# Patient Record
Sex: Female | Born: 1970 | Race: White | Hispanic: No | Marital: Married | Smoking: Never smoker
Health system: Southern US, Community
[De-identification: ages and names within clinical notes are randomized; demographics above are authoritative.]

## PROBLEM LIST (undated history)

## (undated) ENCOUNTER — Emergency Department (HOSPITAL_COMMUNITY): Discharge status: Against Medical Advice | Payer: Self-pay

## (undated) DIAGNOSIS — Z9221 Personal history of antineoplastic chemotherapy: Secondary | ICD-10-CM

## (undated) DIAGNOSIS — Z923 Personal history of irradiation: Secondary | ICD-10-CM

## (undated) DIAGNOSIS — C801 Malignant (primary) neoplasm, unspecified: Secondary | ICD-10-CM

## (undated) DIAGNOSIS — R519 Headache, unspecified: Secondary | ICD-10-CM

---

## 2003-02-16 ENCOUNTER — Other Ambulatory Visit: Admission: RE | Admit: 2003-02-16 | Discharge: 2003-02-16 | Payer: Self-pay | Admitting: Obstetrics and Gynecology

## 2003-05-27 ENCOUNTER — Other Ambulatory Visit: Admission: RE | Admit: 2003-05-27 | Discharge: 2003-05-27 | Payer: Self-pay | Admitting: Obstetrics and Gynecology

## 2003-11-02 ENCOUNTER — Other Ambulatory Visit: Admission: RE | Admit: 2003-11-02 | Discharge: 2003-11-02 | Payer: Self-pay | Admitting: Obstetrics and Gynecology

## 2004-05-20 ENCOUNTER — Inpatient Hospital Stay (HOSPITAL_COMMUNITY): Admission: AD | Admit: 2004-05-20 | Discharge: 2004-05-22 | Payer: Self-pay | Admitting: Obstetrics and Gynecology

## 2004-07-11 ENCOUNTER — Other Ambulatory Visit: Admission: RE | Admit: 2004-07-11 | Discharge: 2004-07-11 | Payer: Self-pay | Admitting: Obstetrics and Gynecology

## 2016-02-02 ENCOUNTER — Other Ambulatory Visit: Payer: Self-pay | Admitting: Obstetrics and Gynecology

## 2016-02-02 DIAGNOSIS — R928 Other abnormal and inconclusive findings on diagnostic imaging of breast: Secondary | ICD-10-CM

## 2016-02-10 ENCOUNTER — Ambulatory Visit
Admission: RE | Admit: 2016-02-10 | Discharge: 2016-02-10 | Disposition: A | Discharge status: Home / Self Care | Payer: Managed Care, Other (non HMO) | Source: Ambulatory Visit | Attending: Obstetrics and Gynecology | Admitting: Obstetrics and Gynecology

## 2016-02-10 DIAGNOSIS — R928 Other abnormal and inconclusive findings on diagnostic imaging of breast: Secondary | ICD-10-CM

## 2021-04-06 ENCOUNTER — Other Ambulatory Visit: Payer: Self-pay | Admitting: Obstetrics and Gynecology

## 2021-04-06 DIAGNOSIS — R928 Other abnormal and inconclusive findings on diagnostic imaging of breast: Secondary | ICD-10-CM

## 2021-04-20 ENCOUNTER — Ambulatory Visit
Admission: RE | Admit: 2021-04-20 | Discharge: 2021-04-20 | Disposition: A | Discharge status: Home / Self Care | Payer: BC Managed Care – PPO | Source: Ambulatory Visit | Attending: Obstetrics and Gynecology | Admitting: Obstetrics and Gynecology

## 2021-04-20 ENCOUNTER — Other Ambulatory Visit: Payer: Self-pay | Admitting: Obstetrics and Gynecology

## 2021-04-20 ENCOUNTER — Ambulatory Visit
Admission: RE | Admit: 2021-04-20 | Discharge: 2021-04-20 | Disposition: A | Discharge status: Home / Self Care | Payer: Managed Care, Other (non HMO) | Source: Ambulatory Visit | Attending: Obstetrics and Gynecology | Admitting: Obstetrics and Gynecology

## 2021-04-20 ENCOUNTER — Other Ambulatory Visit: Payer: Self-pay

## 2021-04-20 DIAGNOSIS — R928 Other abnormal and inconclusive findings on diagnostic imaging of breast: Secondary | ICD-10-CM

## 2021-04-20 DIAGNOSIS — N6310 Unspecified lump in the right breast, unspecified quadrant: Secondary | ICD-10-CM

## 2021-04-21 ENCOUNTER — Ambulatory Visit
Admission: RE | Admit: 2021-04-21 | Discharge: 2021-04-21 | Disposition: A | Discharge status: Home / Self Care | Payer: BC Managed Care – PPO | Source: Ambulatory Visit | Attending: Obstetrics and Gynecology | Admitting: Obstetrics and Gynecology

## 2021-04-21 DIAGNOSIS — C801 Malignant (primary) neoplasm, unspecified: Secondary | ICD-10-CM

## 2021-04-21 DIAGNOSIS — N6310 Unspecified lump in the right breast, unspecified quadrant: Secondary | ICD-10-CM

## 2021-04-21 HISTORY — DX: Malignant (primary) neoplasm, unspecified: C80.1

## 2021-04-26 ENCOUNTER — Other Ambulatory Visit (HOSPITAL_COMMUNITY): Payer: BC Managed Care – PPO

## 2021-04-28 ENCOUNTER — Encounter: Payer: Self-pay | Admitting: *Deleted

## 2021-04-28 ENCOUNTER — Telehealth: Payer: Self-pay | Admitting: *Deleted

## 2021-04-28 ENCOUNTER — Inpatient Hospital Stay: Payer: BC Managed Care – PPO | Attending: Hematology and Oncology | Admitting: Hematology and Oncology

## 2021-04-28 ENCOUNTER — Other Ambulatory Visit: Payer: Self-pay

## 2021-04-28 ENCOUNTER — Telehealth: Payer: Self-pay | Admitting: Hematology and Oncology

## 2021-04-28 ENCOUNTER — Other Ambulatory Visit: Payer: Self-pay | Admitting: *Deleted

## 2021-04-28 ENCOUNTER — Encounter: Payer: BC Managed Care – PPO | Admitting: Genetic Counselor

## 2021-04-28 DIAGNOSIS — C50411 Malignant neoplasm of upper-outer quadrant of right female breast: Secondary | ICD-10-CM | POA: Insufficient documentation

## 2021-04-28 DIAGNOSIS — Z803 Family history of malignant neoplasm of breast: Secondary | ICD-10-CM | POA: Diagnosis not present

## 2021-04-28 DIAGNOSIS — Z171 Estrogen receptor negative status [ER-]: Secondary | ICD-10-CM | POA: Insufficient documentation

## 2021-04-28 NOTE — Progress Notes (Deleted)
Breast - No Medical Intervention - Off Treatment. ? ?Patient Characteristics: ?Preoperative or Nonsurgical Candidate (Clinical Staging), Surgery followed by Adjuvant Therapy ?Therapeutic Status: Preoperative or Nonsurgical Candidate (Clinical Staging) ?AJCC M Category: cM0 ?AJCC Grade: G3 ?Breast Surgical Plan: Surgery followed by Adjuvant Therapy (if necessary) ?ER Status: Negative (-) ?AJCC 8 Stage Grouping: IB ?HER2 Status: Negative (-) ?AJCC T Category: cT1a ?AJCC N Category: cN0 ?PR Status: Negative (-) ? ?

## 2021-04-28 NOTE — Progress Notes (Signed)
[place] ?CONSULT NOTE ? ?Patient Care Team: ?Louretta Shorten, MD as PCP - General (Obstetrics and Gynecology) ? ?CHIEF COMPLAINTS/PURPOSE OF CONSULTATION:  ?Newly diagnosed breast cancer ? ?HISTORY OF PRESENTING ILLNESS:  ?Natasha Ortega 51 y.o. female is here because of recent diagnosis of right-sided breast cancer.  She had a routine mammogram that detected an abnormality in the right breast for which she underwent additional mammograms and ultrasound.  By ultrasound it measured 6 mm at 10:30 position 9 cm from the nipple.  There were no abnormal lymph nodes.  She underwent ultrasound-guided biopsy which revealed grade 2 IDC that was ER/PR negative and HER2 2+. ?I reviewed her records extensively and collaborated the history with the patient. ? ?SUMMARY OF ONCOLOGIC HISTORY: ?Oncology History  ?Malignant neoplasm of upper-outer quadrant of right breast in female, estrogen receptor negative ([place])  ?04/28/2021 Initial Diagnosis  ? Screening mammogram detected right breast mass 6 mm by ultrasound, axilla negative, biopsy: Grade 2 IDC ER 0%, PR 0%, HER2 2+ by IHC ?  ? ? ? ?MEDICAL HISTORY: No other past medical problems ?SURGICAL HISTORY: No prior surgeries ?SOCIAL HISTORY: Denies any tobacco or alcohol or recreational drug use ?FAMILY HISTORY: ?Family History  ?Problem Relation Age of Onset  ? Breast cancer Maternal Grandmother 34  ? ? ?ALLERGIES:  has no allergies on file. ? ?MEDICATIONS:  ?No current outpatient medications on file.  ? ?No current facility-administered medications for this visit.  ? ? ?REVIEW OF SYSTEMS:   ?Constitutional: Denies fevers, chills or abnormal night sweats ?All other systems were reviewed with the patient and are negative. ? ?PHYSICAL EXAMINATION: ?ECOG PERFORMANCE STATUS: 0 - Asymptomatic ? ?Vitals:  ? 04/28/21 1549  ?BP: (!) 148/86  ?Pulse: (!) 105  ?Resp: 18  ?Temp: 97.7 ?F (36.5 ?C)  ?SpO2: 99%  ? ?Filed Weights  ? 04/28/21 1549  ?Weight: 194 lb 9.6 oz (88.3 kg)   ? ?RADIOGRAPHIC STUDIES: ?I have personally reviewed the radiological reports and agreed with the findings in the report. ? ?ASSESSMENT AND PLAN:  ?Malignant neoplasm of upper-outer quadrant of right breast in female, estrogen receptor negative ([place]) ?04/21/2021:Screening mammogram detected right breast mass 6 mm by ultrasound, axilla negative, biopsy: Grade 2 IDC ER 0%, PR 0%, HER2 2+ by IHC ? ?Pathology and radiology counseling: Discussed with the patient, the details of pathology including the type of breast cancer,the clinical staging, the significance of ER, PR and HER-2/neu receptors and the implications for treatment. After reviewing the pathology in detail, we proceeded to discuss the different treatment options between surgery, radiation, chemotherapy  ? ?Treatment plan: ?1.  FISH for HER2 needs to be done ?2. breast conserving surgery with sentinel lymph node biopsy ?3.  Adjuvant chemotherapy based on final tumor size.  I discussed with the family that if the final tumor size is 6 mm or greater then she will require systemic chemotherapy.  The biopsy size was 5 mm.  Therefore we will hold off on placing a port until we see the final path. ?4.  Adjuvant radiation ? ?Genetic testing ? ?Return to clinic after surgery to discuss final pathology results and to determine if chemotherapy is required. ? ? ?All questions were answered. The patient knows to call the clinic with any problems, questions or concerns. ?  ? Harriette Ohara, MD ?04/28/21 ? ? ?

## 2021-04-28 NOTE — Telephone Encounter (Signed)
Called pt, provided navigation resources and contact information. Discussed new pt appt with Dr. Lindi Adie today (3/9) at 4pm ?Denies further questions at this time. Encourage pt to call with questions or needs. Received verbal understanding. ?

## 2021-04-28 NOTE — Progress Notes (Deleted)
DISCONTINUE ON PATHWAY REGIMEN - Breast ? ?No Medical Intervention - Off Treatment. ? ?PRIOR TREATMENT: [id number]: Referral to Surgery ? ?Breast - No Medical Intervention - Off Treatment. ? ?Patient Characteristics: ?Preoperative or Nonsurgical Candidate (Clinical Staging), Surgery followed by Adjuvant Therapy ?Therapeutic Status: Preoperative or Nonsurgical Candidate (Clinical Staging) ?AJCC M Category: cM0 ?AJCC Grade: G3 ?Breast Surgical Plan: Surgery followed by Adjuvant Therapy (if necessary) ?ER Status: Negative (-) ?AJCC 8 Stage Grouping: IB ?HER2 Status: Negative (-) ?AJCC T Category: cT1a ?AJCC N Category: cN0 ?PR Status: Negative (-) ? ?

## 2021-04-28 NOTE — Telephone Encounter (Signed)
Scheduled appt per 3/9 secure chat with RN Dawn. Pt is aware of appt date and time. Pt is aware to arrive 15 mins prior to appt time and to bring and updated insurance card. Pt is aware of appt location.   ?

## 2021-04-28 NOTE — Assessment & Plan Note (Signed)
04/21/2021:Screening mammogram detected right breast mass 6 mm by ultrasound, axilla negative, biopsy: Grade 2 IDC ER 0%, PR 0%, HER2 2+ by IHC ? ?Pathology and radiology counseling: Discussed with the patient, the details of pathology including the type of breast cancer,the clinical staging, the significance of ER, PR and HER-2/neu receptors and the implications for treatment. After reviewing the pathology in detail, we proceeded to discuss the different treatment options between surgery, radiation, chemotherapy  ? ?Treatment plan: ?1.  FISH for HER2 needs to be done ?2. breast conserving surgery with sentinel lymph node biopsy ?3.  Adjuvant chemotherapy based on final tumor size.  I discussed with the family that if the final tumor size is 6 mm or greater then she will require systemic chemotherapy.  The biopsy size was 5 mm.  Therefore we will hold off on placing a port until we see the final path. ?4.  Adjuvant radiation ? ?Genetic testing ? ?Return to clinic after surgery to discuss final pathology results and to determine if chemotherapy is required. ?

## 2021-04-29 NOTE — Progress Notes (Unsigned)
Request for [facility] faxed to [facility] [phone] ?

## 2021-05-02 ENCOUNTER — Encounter: Payer: Self-pay | Admitting: *Deleted

## 2021-05-02 ENCOUNTER — Telehealth: Payer: Self-pay | Admitting: Hematology and Oncology

## 2021-05-02 ENCOUNTER — Other Ambulatory Visit: Payer: BC Managed Care – PPO

## 2021-05-02 NOTE — Progress Notes (Incomplete)
Radiation Oncology         [phone] ________________________________  Name: Natasha Ortega        MRN: [id number]  Date of Service: 05/03/2021 DOB: 01/09/1971  RC:VELF, Shanon Brow, MD  Nicholas Lose, MD     REFERRING PHYSICIAN: Nicholas Lose, MD   DIAGNOSIS: The encounter diagnosis was Malignant neoplasm of upper-outer quadrant of right breast in female, estrogen receptor negative ([place]).   HISTORY OF PRESENT ILLNESS: Natasha Ortega is a 51 y.o. female seen at the request of Dr. Lindi Adie for new diagnosis of right breast cancer.  The patient was found on screening mammogram to have an asymmetry in the right breast confirmed in the upper outer quadrant by diagnostic mammogram.  By ultrasound this was found in the 10 o'clock position measuring 6 mm in her right axilla was negative for adenopathy.  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  She is seen today to discuss treatment recommendations of her cancer.  Dr. Lindi Adie has recommended proceeding with breast conserving surgery which she is in the process of trying to coordinate, he also discussed the possibility of adjuvant chemotherapy depending on HER2 results and/or Oncotype scoring.     PREVIOUS RADIATION THERAPY: {EXAM; YES/NO:19492::"No"}   PAST MEDICAL HISTORY: No past medical history on file.     PAST SURGICAL HISTORY:*** The histories are not reviewed yet. Please review them in the "History" navigator section and refresh this [place].   FAMILY HISTORY:  Family History  Problem Relation Age of Onset   Breast cancer Maternal Grandmother 20     SOCIAL HISTORY: She is married and lives in [place].***   ALLERGIES: Patient has no allergy information on record.   MEDICATIONS:  No current outpatient medications on file.   No current facility-administered medications for this visit.     REVIEW OF SYSTEMS: On review of  systems, the patient reports that she is doing ***     PHYSICAL EXAM:  Wt Readings from Last 3 Encounters:  04/28/21 194 lb 9.6 oz (88.3 kg)   Temp Readings from Last 3 Encounters:  04/28/21 97.7 F (36.5 C) (Temporal)   BP Readings from Last 3 Encounters:  04/28/21 (!) 148/86   Pulse Readings from Last 3 Encounters:  04/28/21 (!) 105    In general this is a well appearing *** female in no acute distress. She's alert and oriented x4 and appropriate throughout the examination. Cardiopulmonary assessment is negative for acute distress and she exhibits normal effort. Bilateral breast exam is deferred.    ECOG = ***  0 - Asymptomatic (Fully active, able to carry on all predisease activities without restriction)  1 - Symptomatic but completely ambulatory (Restricted in physically strenuous activity but ambulatory and able to carry out work of a light or sedentary nature. For example, light housework, office work)  2 - Symptomatic, <50% in bed during the day (Ambulatory and capable of all self care but unable to carry out any work activities. Up and about more than 50% of waking hours)  3 - Symptomatic, >50% in bed, but not bedbound (Capable of only limited self-care, confined to bed or chair 50% or more of waking hours)  4 - Bedbound (Completely disabled. Cannot carry on any self-care. Totally confined to bed or chair)  5 - Death   Eustace Pen MM, Creech RH, Tormey DC, et al. [phone]). "Toxicity and response criteria of  the [facility]". [place] Oncol. 5 (6): 649-55    LABORATORY DATA:  No results found for: WBC, HGB, HCT, MCV, PLT No results found for: NA, K, CL, CO2 No results found for: ALT, AST, GGT, ALKPHOS, BILITOT    RADIOGRAPHY: US BREAST LTD UNI RIGHT INC AXILLA  Result Date: 04/20/2021 CLINICAL DATA:  Patient returns today to evaluate a possible RIGHT breast mass identified on recent screening mammogram. EXAM: DIGITAL DIAGNOSTIC UNILATERAL RIGHT  MAMMOGRAM WITH TOMOSYNTHESIS AND CAD; ULTRASOUND RIGHT BREAST LIMITED TECHNIQUE: Right digital diagnostic mammography and breast tomosynthesis was performed. The images were evaluated with computer-aided detection.; Targeted ultrasound examination of the right breast was performed COMPARISON:  Previous exams including recent screening mammogram dated 04/05/2021. ACR Breast Density Category b: There are scattered areas of fibroglandular density. FINDINGS: On today's additional diagnostic views with spot compression and 3D tomosynthesis, an irregular mass is confirmed within the upper-outer quadrant of the RIGHT breast, measuring approximately 7 mm greatest dimension. Targeted ultrasound is performed, showing an irregular hypoechoic mass in the RIGHT breast at the 10:30 o'clock axis, 9 cm from the nipple, measuring 6 x 4 x 5 mm, corresponding to the mammographic finding. RIGHT axilla was evaluated with ultrasound showing no enlarged or morphologically abnormal lymph nodes. IMPRESSION: Irregular hypoechoic mass in the RIGHT breast at the 10:30 o'clock axis, 9 cm from the nipple, measuring 6 mm, corresponding to the mammographic finding. This is a suspicious finding for which ultrasound-guided core biopsy is recommended. RECOMMENDATION: Ultrasound-guided core biopsy for the RIGHT breast mass at the 10:30 o'clock axis. Ultrasound-guided biopsy is scheduled on March 13th. I have discussed the findings and recommendations with the patient. If applicable, a reminder letter will be sent to the patient regarding the next appointment. BI-RADS CATEGORY  4: Suspicious. Electronically Signed   By: Franki Cabot M.D.   On: 04/20/2021 15:33  MM DIAG BREAST TOMO UNI RIGHT  Result Date: 04/20/2021 CLINICAL DATA:  Patient returns today to evaluate a possible RIGHT breast mass identified on recent screening mammogram. EXAM: DIGITAL DIAGNOSTIC UNILATERAL RIGHT MAMMOGRAM WITH TOMOSYNTHESIS AND CAD; ULTRASOUND RIGHT BREAST LIMITED  TECHNIQUE: Right digital diagnostic mammography and breast tomosynthesis was performed. The images were evaluated with computer-aided detection.; Targeted ultrasound examination of the right breast was performed COMPARISON:  Previous exams including recent screening mammogram dated 04/05/2021. ACR Breast Density Category b: There are scattered areas of fibroglandular density. FINDINGS: On today's additional diagnostic views with spot compression and 3D tomosynthesis, an irregular mass is confirmed within the upper-outer quadrant of the RIGHT breast, measuring approximately 7 mm greatest dimension. Targeted ultrasound is performed, showing an irregular hypoechoic mass in the RIGHT breast at the 10:30 o'clock axis, 9 cm from the nipple, measuring 6 x 4 x 5 mm, corresponding to the mammographic finding. RIGHT axilla was evaluated with ultrasound showing no enlarged or morphologically abnormal lymph nodes. IMPRESSION: Irregular hypoechoic mass in the RIGHT breast at the 10:30 o'clock axis, 9 cm from the nipple, measuring 6 mm, corresponding to the mammographic finding. This is a suspicious finding for which ultrasound-guided core biopsy is recommended. RECOMMENDATION: Ultrasound-guided core biopsy for the RIGHT breast mass at the 10:30 o'clock axis. Ultrasound-guided biopsy is scheduled on March 13th. I have discussed the findings and recommendations with the patient. If applicable, a reminder letter will be sent to the patient regarding the next appointment. BI-RADS CATEGORY  4: Suspicious. Electronically Signed   By: Franki Cabot M.D.   On: 04/20/2021 15:33  MM  CLIP PLACEMENT RIGHT  Result Date: 04/21/2021 CLINICAL DATA:  Post biopsy mammogram of the right breast for clip placement. EXAM: 3D DIAGNOSTIC RIGHT MAMMOGRAM POST ULTRASOUND BIOPSY COMPARISON:  Previous exam(s). FINDINGS: 3D Mammographic images were obtained following ultrasound guided biopsy of a mass in the right breast at 10:30. The biopsy marking clip  is in expected position at the site of biopsy. IMPRESSION: Appropriate positioning of the ribbon shaped biopsy marking clip at the site of biopsy in the upper-outer right breast. Final Assessment: Post Procedure Mammograms for Marker Placement Electronically Signed   By: Ammie Ferrier M.D.   On: 04/21/2021 15:27  [place] RT BREAST BX W LOC DEV 1ST LESION IMG BX SPEC US GUIDE  Addendum Date: 04/26/2021   ADDENDUM REPORT: 04/26/2021 08:50 ADDENDUM: Pathology revealed GRADE III INVASIVE DUCTAL CARCINOMA of the RIGHT breast, 10:30 o'clock, (ribbon clip). This was found to be concordant by Dr. Ammie Ferrier. Pathology results were discussed with the patient and her husband by telephone. The patient reported doing well after the biopsy with tenderness at the site. Post biopsy instructions and care were reviewed and questions were answered. The patient was encouraged to call [facility] for any additional concerns. My direct phone number was provided. Surgical consultation has been arranged with Dr. Rolm Bookbinder, per patient request, at [facility] on May 17, 2021. Request for medical oncology consultation with Dr. Nicholas Lose, per patient request, at [facility] was sent via [facility] message on April 25, 2021. The patient will be contacted with the appointment by the [place] staff. Pathology results reported by Terie Purser, RN on 04/25/2021. Electronically Signed   By: Ammie Ferrier M.D.   On: 04/26/2021 08:50   Result Date: 04/26/2021 CLINICAL DATA:  51 year old female presenting for ultrasound-guided biopsy of a right breast mass. EXAM: ULTRASOUND GUIDED RIGHT BREAST CORE NEEDLE BIOPSY COMPARISON:  Previous exam(s). PROCEDURE: I met with the patient and we discussed the procedure of ultrasound-guided biopsy, including benefits and alternatives. We discussed the high likelihood of a successful procedure. We discussed the risks of the procedure,  including infection, bleeding, tissue injury, clip migration, and inadequate sampling. Informed written consent was given. The usual time-out protocol was performed immediately prior to the procedure. Lesion quadrant: Upper outer quadrant Using sterile technique and 1% Lidocaine as local anesthetic, under direct ultrasound visualization, a 14 gauge spring-loaded device was used to perform biopsy of a mass in the right breast at 10:30, 9 cm from the nipple using a lateral approach. At the conclusion of the procedure a ribbon shaped tissue marker clip was deployed into the biopsy cavity. Follow up 2 view mammogram was performed and dictated separately. IMPRESSION: Ultrasound guided biopsy of a right breast mass at 10:30. No apparent complications. Electronically Signed: By: Ammie Ferrier M.D. On: 04/21/2021 15:13      IMPRESSION/PLAN: 1. Stage [id number], grade 3 *** invasive ductal carcinoma of the right breast. Dr. Lisbeth Renshaw discusses the pathology findings and reviews the nature of *** breast disease. The consensus from the breast conference includes breast conservation with lumpectomy with *** sentinel node biopsy. Depending on the size of the final tumor measurements rendered by pathology, the tumor may be tested for Oncotype Dx score to determine a role for systemic therapy. Provided that chemotherapy is not indicated, the patient's course would then be followed by external radiotherapy to the breast  to reduce risks of local recurrence followed by antiestrogen therapy. We discussed the risks, benefits,  short, and long term effects of radiotherapy, as well as the curative intent, and the patient is interested in proceeding. Dr. Lisbeth Renshaw discusses the delivery and logistics of radiotherapy and anticipates a course of *** weeks of radiotherapy. We will see her back a few weeks after surgery to discuss the simulation process and anticipate we starting radiotherapy about 4-6 weeks after surgery.    In a visit  lasting *** minutes, greater than 50% of the time was spent face to face reviewing her case, as well as in preparation of, discussing, and coordinating the patient's care.  The above documentation reflects my direct findings during this shared patient visit. Please see the separate note by Dr. Lisbeth Renshaw on this date for the remainder of the patient's plan of care.    Carola Rhine, [facility]    **Disclaimer: This note was dictated with voice recognition software. Similar sounding words can inadvertently be transcribed and this note may contain transcription errors which may not have been corrected upon publication of note.**

## 2021-05-02 NOTE — Progress Notes (Unsigned)
REFERRING PROVIDER: Nicholas Lose, MD [place], [place] [address]  PRIMARY PROVIDER:  Louretta Shorten, MD  PRIMARY REASON FOR VISIT:  ***   HISTORY OF PRESENT ILLNESS:   Ms. Wisby, a 51 y.o. female, was seen for a [place] cancer genetics consultation at the request of Dr. Lindi Adie due to a {Personal/family:20331} history of cancer. Ms. Drier presents to clinic today to discuss the possibility of a hereditary predisposition to cancer, to discuss genetic testing, and to further clarify her future cancer risks, as well as potential cancer risks for family members.   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 The tumor is described as ER/PR negative, HER2 pending. The treatment plan ***.   CANCER HISTORY:  Oncology History  Malignant neoplasm of upper-outer quadrant of right breast in female, estrogen receptor negative ([place])  04/28/2021 Initial Diagnosis   Screening mammogram detected right breast mass 6 mm by ultrasound, axilla negative, biopsy: Grade 2 IDC ER 0%, PR 0%, HER2 2+ by IHC      RISK FACTORS:  Menarche was at age ***.  First live birth at age ***.  OCP use for approximately {Numbers 1-12 multi-select:20307} years.  Ovaries intact: {Yes/No-Ex:120004}.  Uterus intact: {Yes/No-Ex:120004}.  Menopausal status: {Menopause:31378}.  HRT use: {Numbers 1-12 multi-select:20307} years. Colonoscopy: {Yes/No-Ex:120004}; {normal/abnormal/not examined:14677}. Mammogram within the last year: {Yes/No-Ex:120004}. Number of breast biopsies: {Numbers 1-12 multi-select:20307}. Up to date with pelvic exams: {Yes/No-Ex:120004}. Any excessive radiation exposure in the past: {Yes/No-Ex:120004}  No past medical history on file.  *** The histories are not reviewed yet. Please review them in the "History" navigator section and refresh this [place].  Social History   Socioeconomic History   Marital status:  Married    Spouse name: Not on file   Number of children: Not on file   Years of education: Not on file   Highest education level: Not on file  Occupational History   Not on file  Tobacco Use   Smoking status: Not on file   Smokeless tobacco: Not on file  Substance and Sexual Activity   Alcohol use: Not on file   Drug use: Not on file   Sexual activity: Not on file  Other Topics Concern   Not on file  Social History Narrative   Not on file   Social Determinants of Health   Financial Resource Strain: Not on file  Food Insecurity: Not on file  Transportation Needs: Not on file  Physical Activity: Not on file  Stress: Not on file  Social Connections: Not on file     FAMILY HISTORY:  We obtained a detailed, 4-generation family history.  Significant diagnoses are listed below: Family History  Problem Relation Age of Onset   Breast cancer Maternal Grandmother 77    Ms. Mcneece is {aware/unaware} of previous family history of genetic testing for hereditary cancer risks. Patient's maternal ancestors are of *** descent, and paternal ancestors are of *** descent. There {IS NO:12509} reported Ashkenazi Jewish ancestry. There {IS NO:12509} known consanguinity.  GENETIC COUNSELING ASSESSMENT: Ms. Grey is a 51 y.o. female with a {Personal/family:20331} history of cancer which is somewhat suggestive of a hereditary cancer syndrome and predisposition to cancer given ***. We, therefore, discussed and recommended the following at today's visit.   DISCUSSION: We discussed that 5 - 10% of cancer is hereditary, with most cases of hereditary breast cancer associated with mutations in BRCA1/2.  There are other  genes that can be associated with hereditary breast and *** cancer syndromes. Type of cancer risk and level of risk are gene-specific. We discussed that testing is beneficial for several reasons including knowing how to follow individuals after completing their treatment, identifying whether  potential treatment options would be beneficial, and understanding if other family members could be at risk for cancer and allowing them to undergo genetic testing.   ***We reviewed the characteristics, features and inheritance patterns of hereditary cancer syndromes. We also discussed genetic testing, including the appropriate family members to test, the process of testing, insurance coverage and turn-around-time for results. We discussed the implications of a negative, positive and/or variant of uncertain significant result. In order to get genetic test results in a timely manner so that Ms. Bolte can use these genetic test results for surgical decisions, we recommended Ms. Devoto pursue genetic testing for the ***. Once complete, we recommend Ms. Scoville pursue reflex genetic testing to a more comprehensive gene panel.   Ms. Zahniser  was offered a common hereditary cancer panel (47 genes) and an expanded pan-cancer panel (77 genes). Ms. Edinger was informed of the benefits and limitations of each panel, including that expanded pan-cancer panels contain genes that do not have clear management guidelines at this point in time.  We also discussed that as the number of genes included on a panel increases, the chances of variants of uncertain significance increases.  After considering the benefits and limitations of each gene panel, Ms. Ysidro Evert  elected to have *** through ***.  ****  Based on Ms. Arambula's {Personal/family:20331} history of cancer, she meets medical criteria for genetic testing. Despite that she meets criteria, she may still have an out of pocket cost. We discussed that if ***her out of pocket cost for testing is over $100 ***he has an out of pocket cost, the laboratory should contact them to discuss self-pay prices, patient pay assistance programs, if applicable, and other billing options.   PLAN: After considering the risks, benefits, and limitations, Ms. Nowak provided informed consent to  pursue genetic testing and the blood sample was sent to {Lab} Laboratories for analysis of the {test}. Results should be available within approximately 1-2 weeks' time, at which point they will be disclosed by telephone to Ms. Bey, as will any additional recommendations warranted by these results. Ms. Brophy will receive a summary of her genetic counseling visit and a copy of her results once available. This information will also be available in Epic.   *** Despite our recommendation, Ms. Razzano did not wish to pursue genetic testing at today's visit. We understand this decision and remain available to coordinate genetic testing at any time in the future. We, therefore, recommend Ms. Hirata continue to follow the cancer screening guidelines given by her primary healthcare provider.  ***Based on Ms. Rone's family history, we recommended her ***, who was diagnosed with *** at age ***, have genetic counseling and testing. Ms. Gottsch will let us know if we can be of any assistance in coordinating genetic counseling and/or testing for this family member.   Lastly, we encouraged Ms. Klumpp to remain in contact with cancer genetics annually so that we can continuously update the family history and inform her of any changes in cancer genetics and testing that may be of benefit for this family.   Ms. Farrugia questions were answered to her satisfaction today. Our contact information was provided should additional questions or concerns arise. Thank you for the referral and allowing [place]  to share in the care of your patient.   Lucille Passy, MS, [facility] Genetic Counselor [email or web address] (P) [phone]  The patient was seen for a total of *** minutes in face-to-face ***audio and video genetic counseling.  ***The patient brought ***.  ***The patient was seen alone.  Drs. Lindi Adie and/or Burr Medico were available to discuss this case as needed.   _______________________________________________________________________ For Office Staff:  Number of people involved in session: *** Was an Intern/ student involved with case: {YES/NO:63}

## 2021-05-02 NOTE — Telephone Encounter (Signed)
.  Called pt per 3/9 inbasket , Patient was unavailable, a message with appt time and date was left with number on file.   ?

## 2021-05-02 NOTE — Progress Notes (Incomplete)
New Breast Cancer Diagnosis: Right Breast- UOQ  Did patient present with symptoms (if so, please note symptoms) or screening mammography?: Screening mammogram showed asymmetry in the right breast confirmed in the upper outer quadrant by diagnostic mammogram.  Location and Extent of disease :right breast. Located at 10 o'clock position, measured  6 mm in greatest dimension. Adenopathy no.    Histology per Pathology Report: grade 3, Invasive Ductal Carcinoma 04/21/2021   Receptor Status: ER(negative), PR (negative), Her2-neu (), Ki-(40%)   Surgeon and surgical plan, if any:    Medical oncologist, treatment if any:   Dr. Lindi Adie 04/28/2021 Treatment plan: 1.  FISH for HER2 needs to be done 2. breast conserving surgery with sentinel lymph node biopsy 3.  Adjuvant chemotherapy based on final tumor size.  I discussed with the family that if the final tumor size is 6 mm or greater then she will require systemic chemotherapy.  The biopsy size was 5 mm.  Therefore we will hold off on placing a port until we see the final path. 4.  Adjuvant radiation   Family History of Breast/Ovarian/Prostate Cancer:   Lymphedema issues, if any:      Pain issues, if any:     SAFETY ISSUES: Prior radiation?  Pacemaker/ICD?  Possible current pregnancy?  Is the patient on methotrexate?   Current Complaints / other details:   Genetics 05/03/2021

## 2021-05-03 ENCOUNTER — Ambulatory Visit
Admission: RE | Admit: 2021-05-03 | Discharge: 2021-05-03 | Disposition: A | Discharge status: Home / Self Care | Payer: BC Managed Care – PPO | Source: Ambulatory Visit | Attending: Radiation Oncology | Admitting: Radiation Oncology

## 2021-05-03 ENCOUNTER — Inpatient Hospital Stay: Payer: BC Managed Care – PPO

## 2021-05-03 ENCOUNTER — Encounter: Payer: Self-pay | Admitting: Radiation Oncology

## 2021-05-03 ENCOUNTER — Encounter: Payer: BC Managed Care – PPO | Admitting: Genetic Counselor

## 2021-05-03 ENCOUNTER — Inpatient Hospital Stay (HOSPITAL_BASED_OUTPATIENT_CLINIC_OR_DEPARTMENT_OTHER): Payer: BC Managed Care – PPO | Admitting: Genetic Counselor

## 2021-05-03 ENCOUNTER — Other Ambulatory Visit: Payer: Self-pay

## 2021-05-03 VITALS — BP 131/87 | HR 72 | Temp 97.9°F | Resp 18 | Ht 64.5 in | Wt 192.4 lb

## 2021-05-03 DIAGNOSIS — Z79899 Other long term (current) drug therapy: Secondary | ICD-10-CM | POA: Insufficient documentation

## 2021-05-03 DIAGNOSIS — Z171 Estrogen receptor negative status [ER-]: Secondary | ICD-10-CM | POA: Insufficient documentation

## 2021-05-03 DIAGNOSIS — Z803 Family history of malignant neoplasm of breast: Secondary | ICD-10-CM | POA: Diagnosis not present

## 2021-05-03 DIAGNOSIS — C50411 Malignant neoplasm of upper-outer quadrant of right female breast: Secondary | ICD-10-CM

## 2021-05-05 ENCOUNTER — Encounter: Payer: Self-pay | Admitting: *Deleted

## 2021-05-05 ENCOUNTER — Encounter: Payer: Self-pay | Admitting: Genetic Counselor

## 2021-05-05 ENCOUNTER — Encounter: Payer: Self-pay | Admitting: Physical Therapy

## 2021-05-05 ENCOUNTER — Other Ambulatory Visit: Payer: Self-pay

## 2021-05-05 ENCOUNTER — Ambulatory Visit: Payer: BC Managed Care – PPO | Attending: Hematology and Oncology | Admitting: Physical Therapy

## 2021-05-05 DIAGNOSIS — C50411 Malignant neoplasm of upper-outer quadrant of right female breast: Secondary | ICD-10-CM | POA: Insufficient documentation

## 2021-05-05 DIAGNOSIS — Z171 Estrogen receptor negative status [ER-]: Secondary | ICD-10-CM | POA: Diagnosis present

## 2021-05-05 DIAGNOSIS — R293 Abnormal posture: Secondary | ICD-10-CM | POA: Insufficient documentation

## 2021-05-05 NOTE — Therapy (Signed)
?OUTPATIENT PHYSICAL THERAPY BREAST CANCER BASELINE EVALUATION ? ? ?Patient Name: Natasha Ortega ?MRN: [id number] ?DOB:07/22/1970, 51 y.o., female ?Today's Date: 05/05/2021 ? ? PT End of Session - 05/05/21 1109   ? ? Visit Number 1   ? Number of Visits 2   ? Date for PT Re-Evaluation 06/30/21   ? PT Start Time 1105   ? Activity Tolerance Patient tolerated treatment well   ? Behavior During Therapy [facility] for tasks assessed/performed   ? ?  ?  ? ?  ? ? ?History reviewed. No pertinent past medical history. ?History reviewed. No pertinent surgical history. ?Patient Active Problem List  ? Diagnosis Date Noted  ? Family history of breast cancer 05/05/2021  ? Malignant neoplasm of upper-outer quadrant of right breast in female, estrogen receptor negative ([place]) 04/28/2021  ? ? ?PCP: Louretta Shorten, MD ? ?REFERRING PROVIDER: Nicholas Lose, MD ? ?REFERRING DIAG: C50.411,Z17.1 (ICD-10-CM) - Malignant neoplasm of upper-outer quadrant of right breast in female, estrogen receptor negative  ? ?THERAPY DIAG: Abnormal posture, Malignant neoplasm of upper-outer quadrant of right breast in female, estrogen receptor negative  ? ? ?ONSET DATE: 04/28/2021 ? ?SUBJECTIVE                                                                                                                                                                                          ? ?SUBJECTIVE STATEMENT: ?Patient reports she is here today to be seen by her medical team for her newly diagnosed right breast cancer.  ? ?PERTINENT HISTORY:  ?Patient was diagnosed on 04/28/2021 with right grade 2 invasive ductal carcinoma breast cancer. It measures 6 mm and is located in the upper outer quadrant. It is ER/PR negative and HER2 pending with a Ki67 of 40%.  ? ?PATIENT GOALS   reduce lymphedema risk and learn post op HEP.  ? ?PAIN:  ?Are you having pain? No ? ? ?PRECAUTIONS: Active CA  ? ?HAND DOMINANCE: right ? ?WEIGHT BEARING RESTRICTIONS No ? ?FALLS:  ?Has patient fallen in last 6  months? No, Number of falls: 0 ? ?LIVING ENVIRONMENT: ?Patient lives with: Husband [place] and 1 y.o. daughter Naida Sleight. She has 2 other children (7 y.o. daughter and 75 y.o. son) ?Lives in: House/apartment ?Has following equipment at home: None ? ?OCCUPATION: Full time accountant and works remotely ? ?LEISURE: She does not exercise ? ?PRIOR LEVEL OF FUNCTION: Independent ? ? ?OBJECTIVE ? ?COGNITION: ? Overall cognitive status: Within functional limits for tasks assessed   ? ?POSTURE:  ?Forward head and rounded shoulders posture ? ?UPPER EXTREMITY AROM/PROM: ? ?A/PROM RIGHT  05/05/2021 ?  ?Shoulder extension 49  ?Shoulder flexion  152  ?Shoulder abduction 164  ?Shoulder internal rotation 63  ?Shoulder external rotation 88  ?  (Blank rows = not tested) ? ?A/PROM LEFT  05/05/2021  ?Shoulder extension 60  ?Shoulder flexion 149  ?Shoulder abduction 162  ?Shoulder internal rotation 61  ?Shoulder external rotation 89  ?  (Blank rows = not tested) ? ? ?CERVICAL AROM: ?All within normal limits ? ?UPPER EXTREMITY STRENGTH: WNL ? ? ?LYMPHEDEMA ASSESSMENTS:  ? ?[place] RIGHT  05/05/2021  ?10 cm proximal to olecranon process 34.5  ?Olecranon process 26.9  ?10 cm proximal to ulnar styloid process 23.7  ?Just proximal to ulnar styloid process 16  ?Across hand at thumb web space 19.5  ?At base of 2nd digit 6.2  ?(Blank rows = not tested) ? ?[place] LEFT  05/05/2021  ?10 cm proximal to olecranon process 34.2  ?Olecranon process 26  ?10 cm proximal to ulnar styloid process 22.8  ?Just proximal to ulnar styloid process 16.3  ?Across hand at thumb web space 19.5  ?At base of 2nd digit 6.2  ?(Blank rows = not tested) ? ? ?L-DEX LYMPHEDEMA SCREENING: ? ?The patient was assessed using the L-Dex machine today to produce a lymphedema index baseline score. The patient will be reassessed on a regular basis (typically every 3 months) to obtain new L-Dex scores. If the score is > 6.5 points away from his/her baseline score indicating onset of  subclinical lymphedema, it will be recommended to wear a compression garment for 4 weeks, 12 hours per day and then be reassessed. If the score continues to be > 6.5 points from baseline at reassessment, we will initiate lymphedema treatment. Assessing in this manner has a 95% rate of preventing clinically significant lymphedema. ? ?L-DEX LYMPHEDEMA SCREENING ?Measurement Type: Unilateral ?L-DEX MEASUREMENT EXTREMITY: Upper Extremity ?POSITION : Standing ?DOMINANT SIDE: Right ?At Risk Side: Right ?BASELINE SCORE (UNILATERAL): -5.6 ? ? ? ? ?QUICK DASH SURVEY: ? Katina Dung - 05/05/21 0001   ? ? Open a tight or new jar No difficulty   ? Do heavy household chores (wash walls, wash floors) No difficulty   ? Carry a shopping bag or briefcase No difficulty   ? Wash your back No difficulty   ? Use a knife to cut food No difficulty   ? Recreational activities in which you take some force or impact through your arm, shoulder, or hand (golf, hammering, tennis) No difficulty   ? During the past week, to what extent has your arm, shoulder or hand problem interfered with your normal social activities with family, friends, neighbors, or groups? Not at all   ? During the past week, to what extent has your arm, shoulder or hand problem limited your work or other regular daily activities Not at all   ? Arm, shoulder, or hand pain. None   ? Tingling (pins and needles) in your arm, shoulder, or hand None   ? Difficulty Sleeping No difficulty   ? DASH Score 0 %   ? ?  ?  ? ?  ? ? ? ?PATIENT EDUCATION:  ?Education details: Lymphedema risk reduction and post op shoulder/posture HEP ?Person educated: Patient ?Education method: Explanation, Demonstration, Handout ?Education comprehension: Patient verbalized understanding and returned demonstration ? ? ?HOME EXERCISE PROGRAM: ?Patient was instructed today in a home exercise program today for post op shoulder range of motion. These included active assist shoulder flexion in sitting, scapular  retraction, wall walking with shoulder abduction, and hands behind head external rotation.  She was encouraged  to do these twice a day, holding 3 seconds and repeating 5 times when permitted by her physician. ? ? ?ASSESSMENT: ? ?CLINICAL IMPRESSION: ?Patient was diagnosed on 04/28/2021 with right grade 2 invasive ductal carcinoma breast cancer. It measures 6 mm and is located in the upper outer quadrant. It is ER/PR negative and HER2 positive with a Ki67 of 40%. Her multidisciplinary medical team met prior to her assessments to determine a recommended treatment plan. She is planning to have a right lumpectomy and sentinel node biopsy followed by chemotherapy if mass is > 5 mm, and radiation.Marland Kitchen She will benefit from a post op PT reassessment to determine needs and from L-Dex screens every 3 months for 2 years to detect subclinical lymphedema. ? ?Pt will benefit from skilled therapeutic intervention to improve on the following deficits: Decreased knowledge of precautions, impaired UE functional use, pain, decreased ROM, postural dysfunction.  ? ?PT treatment/interventions: ADL/self-care home management, pt/family education, therapeutic exercise ? ?REHAB POTENTIAL: Excellent ? ?CLINICAL DECISION MAKING: Stable/uncomplicated ? ?EVALUATION COMPLEXITY: Low ? ? ?GOALS: ?Goals reviewed with patient? YES ? ?LONG TERM GOALS: (STG=LTG) ? ? Name Target Date Goal status  ?1 Pt will be able to verbalize understanding of pertinent lymphedema risk reduction practices relevant to her dx specifically related to skin care.  ?Baseline:  No knowledge 05/05/2021 Achieved at eval  ?2 Pt will be able to return demo and/or verbalize understanding of the post op HEP related to regaining shoulder ROM. ?Baseline:  No knowledge 05/05/2021 Achieved at eval  ?3 Pt will be able to verbalize understanding of the importance of attending the post op After Breast CA Class for further lymphedema risk reduction education and therapeutic exercise.  ?Baseline:   No knowledge 05/05/2021 Achieved at eval  ?4 Pt will demo she has regained full shoulder ROM and function post operatively compared to baselines.  ?Baseline: See objective measurements taken today. 06/30/2021

## 2021-05-06 ENCOUNTER — Encounter: Payer: Self-pay | Admitting: *Deleted

## 2021-05-06 ENCOUNTER — Other Ambulatory Visit: Payer: Self-pay | Admitting: Surgery

## 2021-05-06 ENCOUNTER — Encounter: Payer: Self-pay | Admitting: Physical Therapy

## 2021-05-06 DIAGNOSIS — Z853 Personal history of malignant neoplasm of breast: Secondary | ICD-10-CM

## 2021-05-09 ENCOUNTER — Telehealth: Payer: Self-pay | Admitting: *Deleted

## 2021-05-09 ENCOUNTER — Ambulatory Visit
Admission: RE | Admit: 2021-05-09 | Discharge: 2021-05-09 | Disposition: A | Discharge status: Home / Self Care | Payer: BC Managed Care – PPO | Source: Ambulatory Visit | Attending: Surgery | Admitting: Surgery

## 2021-05-09 ENCOUNTER — Encounter: Payer: Self-pay | Admitting: Genetic Counselor

## 2021-05-09 DIAGNOSIS — Z853 Personal history of malignant neoplasm of breast: Secondary | ICD-10-CM

## 2021-05-09 DIAGNOSIS — Z1379 Encounter for other screening for genetic and chromosomal anomalies: Secondary | ICD-10-CM | POA: Insufficient documentation

## 2021-05-09 NOTE — Telephone Encounter (Signed)
Received FISH results of Her2+ ?Physician team notified.Hulen Skains pt with results, discussed will wait for final path to determine if adjuvant chemo recommended. ?Received verbal understanding. ?

## 2021-05-10 ENCOUNTER — Other Ambulatory Visit: Payer: Self-pay

## 2021-05-10 ENCOUNTER — Encounter (HOSPITAL_COMMUNITY): Payer: Self-pay | Admitting: Surgery

## 2021-05-10 NOTE — Progress Notes (Signed)
Mrs. Balsley denies chest pain or shortness of breath. Patient denies having any s/s of Covid in her household.  Patient denies any known exposure to Covid.  ? ?I instructed Mrs Fiola  to shower with antibacteria soap.  DO not shave. No nail polish, artificial or acrylic nails. Wear clean clothes, brush your teeth. ?Glasses, contact lens,dentures or partials may not be worn in the OR. If you need to wear them, please bring a case for glasses, do not wear contacts or bring a case, the hospital does not have contact cases, dentures or partials will have to be removed , make sure they are clean, we will provide a denture cup to put them in. You will need some one to drive you home and a responsible person over the age of 39 to stay with you for the first 24 hours after surgery.  ?

## 2021-05-10 NOTE — H&P (Signed)
?  REFERRING PHYSICIAN: Rulon Eisenmenger, MD ? ?PROVIDER: Beverlee Nims, MD ? ?MRN: [id number] ?DOB: [birth year] ?DATE OF ENCOUNTER: 05/06/2021 ?Subjective  ? ?Chief Complaint: Breast Problem ? ? ?History of Present Illness: ?Natasha Ortega is a 51 y.o. female who is seen today as an office consultation for evaluation of Breast Problem ?.  ? ?This is a pleasant 51 year old female who was found on recent screening mammography to have a small 6 mm mass in the upper outer quadrant of the right breast. She underwent a biopsy of the mass which showed an invasive ductal carcinoma. Currently, it appears to be triple negative but may be HER2 positive. Ki-67 was 40%. Ultrasound of the axilla was unremarkable. She has a family history of breast cancer in her maternal grandmother. She denies nipple discharge. She is otherwise healthy without complaints. She has no cardiopulmonary issues. ? ?Review of Systems: ?A complete review of systems was obtained from the patient. I have reviewed this information and discussed as appropriate with the patient. See HPI as well for other ROS. ? ?ROS  ? ?Medical History: ?Past Medical History:  ?Diagnosis Date  ? History of cancer  ? ?There is no problem list on file for this patient. ? ?History reviewed. No pertinent surgical history.  ? ?Allergies  ?Allergen Reactions  ? Penicillins Rash  ? ?No current outpatient medications on file prior to visit.  ? ?No current facility-administered medications on file prior to visit.  ? ?Family History  ?Problem Relation Age of Onset  ? Diabetes Mother  ? ? ?Social History  ? ?Tobacco Use  ?Smoking Status Never  ?Smokeless Tobacco Never  ? ? ?Social History  ? ?Socioeconomic History  ? Marital status: Married  ?Tobacco Use  ? Smoking status: Never  ? Smokeless tobacco: Never  ?Vaping Use  ? Vaping Use: Never used  ?Substance and Sexual Activity  ? Alcohol use: Yes  ? Drug use: Never  ? ?Objective:  ? ?Vitals:  ?05/06/21 0957  ?BP: 122/88  ?Pulse: 63   ?Temp: 36.8 ?C (98.3 ?F)  ?SpO2: 98%  ?Weight: 87.7 kg (193 lb 6.4 oz)  ?Height: 163.8 cm (5' 4.5")  ? ?Body mass index is 32.68 kg/m?. ? ?Physical Exam  ? ?She appears well on exam ? ?There is no palpable right breast mass. The nipple areolar complex is normal. She has no ecchymosis or hematoma from her biopsy. There is no axillary adenopathy ? ?Labs, Imaging and Diagnostic Testing: ?I have reviewed her mammograms, ultrasound, and pathology results ? ?Assessment and Plan:  ? ?Diagnoses and all orders for this visit: ? ?Breast cancer, stage 1, right (CMS-HCC) ? ? ? ?This is a patient with a possible triple negative small right breast cancer. We discussed the diagnosis in detail. We also discussed breast conservation versus mastectomy. She and her husband are interested in breast conservation. We next discussed proceeding with a radioactive seed guided right breast lumpectomy and sentinel node biopsy. We discussed the surgical procedure in detail. We discussed the risks which includes but is not limited to bleeding, infection, injury to surrounding structures, the need for further procedures if the margins or lymph nodes are positive, seroma formation, arm swelling, cardiopulmonary issues, postoperative recovery, etc. Currently, we will hold off on Port-A-Cath insertion until we know the final pathology. They understand. Surgery will be scheduled urgently  ?

## 2021-05-11 ENCOUNTER — Ambulatory Visit (HOSPITAL_COMMUNITY): Payer: BC Managed Care – PPO | Admitting: Anesthesiology

## 2021-05-11 ENCOUNTER — Ambulatory Visit
Admission: RE | Admit: 2021-05-11 | Discharge: 2021-05-11 | Disposition: A | Discharge status: Home / Self Care | Payer: BC Managed Care – PPO | Source: Ambulatory Visit | Attending: Surgery | Admitting: Surgery

## 2021-05-11 ENCOUNTER — Ambulatory Visit (HOSPITAL_COMMUNITY)
Admission: RE | Admit: 2021-05-11 | Discharge: 2021-05-11 | Disposition: A | Discharge status: Home / Self Care | Payer: BC Managed Care – PPO | Attending: Surgery | Admitting: Surgery

## 2021-05-11 ENCOUNTER — Encounter (HOSPITAL_COMMUNITY): Payer: Self-pay | Admitting: Surgery

## 2021-05-11 ENCOUNTER — Encounter (HOSPITAL_COMMUNITY)
Admission: RE | Disposition: A | Discharge status: Home / Self Care | Payer: Self-pay | Source: Home / Self Care | Attending: Surgery

## 2021-05-11 DIAGNOSIS — N6011 Diffuse cystic mastopathy of right breast: Secondary | ICD-10-CM | POA: Diagnosis not present

## 2021-05-11 DIAGNOSIS — C50411 Malignant neoplasm of upper-outer quadrant of right female breast: Secondary | ICD-10-CM | POA: Diagnosis present

## 2021-05-11 DIAGNOSIS — Z803 Family history of malignant neoplasm of breast: Secondary | ICD-10-CM | POA: Insufficient documentation

## 2021-05-11 DIAGNOSIS — Z853 Personal history of malignant neoplasm of breast: Secondary | ICD-10-CM

## 2021-05-11 HISTORY — DX: Malignant (primary) neoplasm, unspecified: C80.1

## 2021-05-11 HISTORY — DX: Headache, unspecified: R51.9

## 2021-05-11 HISTORY — PX: BREAST LUMPECTOMY WITH RADIOACTIVE SEED AND SENTINEL LYMPH NODE BIOPSY: SHX6550

## 2021-05-11 HISTORY — PX: BREAST LUMPECTOMY: SHX2

## 2021-05-11 LAB — CBC
HCT: 45.9 % (ref 36.0–46.0)
Hemoglobin: 15.1 g/dL — ABNORMAL HIGH (ref 12.0–15.0)
MCH: 30.3 pg (ref 26.0–34.0)
MCHC: 32.9 g/dL (ref 30.0–36.0)
MCV: 92.2 fL (ref 80.0–100.0)
Platelets: 353 10*3/uL (ref 150–400)
RBC: 4.98 MIL/uL (ref 3.87–5.11)
RDW: 12 % (ref 11.5–15.5)
WBC: 5.8 10*3/uL (ref 4.0–10.5)
nRBC: 0 % (ref 0.0–0.2)

## 2021-05-11 SURGERY — BREAST LUMPECTOMY WITH RADIOACTIVE SEED AND SENTINEL LYMPH NODE BIOPSY
Anesthesia: Regional | Site: Breast | Laterality: Right

## 2021-05-11 MED ORDER — KETOROLAC TROMETHAMINE 30 MG/ML IJ SOLN
INTRAMUSCULAR | Status: AC
  Filled 2021-05-11: qty 1

## 2021-05-11 MED ORDER — CHLORHEXIDINE GLUCONATE 0.12 % MT SOLN
OROMUCOSAL | Status: AC
  Administered 2021-05-11: 15 mL via OROMUCOSAL
  Filled 2021-05-11: qty 15

## 2021-05-11 MED ORDER — DEXAMETHASONE SODIUM PHOSPHATE 10 MG/ML IJ SOLN
INTRAMUSCULAR | Status: DC | PRN
  Administered 2021-05-11: 10 mg via INTRAVENOUS

## 2021-05-11 MED ORDER — LACTATED RINGERS IV SOLN: INTRAVENOUS | Status: DC

## 2021-05-11 MED ORDER — MEPERIDINE HCL 25 MG/ML IJ SOLN: 6.2500 mg | INTRAMUSCULAR | Status: DC | PRN

## 2021-05-11 MED ORDER — FENTANYL CITRATE (PF) 100 MCG/2ML IJ SOLN
INTRAMUSCULAR | Status: AC
  Administered 2021-05-11: 100 ug via INTRAVENOUS
  Filled 2021-05-11: qty 2

## 2021-05-11 MED ORDER — PROPOFOL 10 MG/ML IV BOLUS
INTRAVENOUS | Status: DC | PRN
Start: 2021-05-11 — End: 2021-05-11
  Administered 2021-05-11: 200 mg via INTRAVENOUS

## 2021-05-11 MED ORDER — EPHEDRINE SULFATE-NACL 50-0.9 MG/10ML-% IV SOSY
PREFILLED_SYRINGE | INTRAVENOUS | Status: DC | PRN
  Administered 2021-05-11 (×3): 5 mg via INTRAVENOUS

## 2021-05-11 MED ORDER — AMISULPRIDE (ANTIEMETIC) 5 MG/2ML IV SOLN: 10.0000 mg | Freq: Once | INTRAVENOUS | Status: DC | PRN

## 2021-05-11 MED ORDER — FENTANYL CITRATE (PF) 250 MCG/5ML IJ SOLN
INTRAMUSCULAR | Status: AC
  Filled 2021-05-11: qty 5

## 2021-05-11 MED ORDER — CHLORHEXIDINE GLUCONATE 0.12 % MT SOLN: 15.0000 mL | Freq: Once | OROMUCOSAL | Status: AC

## 2021-05-11 MED ORDER — BUPIVACAINE-EPINEPHRINE (PF) 0.25% -1:200000 IJ SOLN
INTRAMUSCULAR | Status: AC
  Filled 2021-05-11: qty 30

## 2021-05-11 MED ORDER — MIDAZOLAM HCL 2 MG/2ML IJ SOLN
INTRAMUSCULAR | Status: AC
  Filled 2021-05-11: qty 2

## 2021-05-11 MED ORDER — 0.9 % SODIUM CHLORIDE (POUR BTL) OPTIME
TOPICAL | Status: DC | PRN
  Administered 2021-05-11: 500 mL

## 2021-05-11 MED ORDER — PROPOFOL 10 MG/ML IV BOLUS
INTRAVENOUS | Status: AC
  Filled 2021-05-11: qty 20

## 2021-05-11 MED ORDER — KETOROLAC TROMETHAMINE 30 MG/ML IJ SOLN
30.0000 mg | Freq: Once | INTRAMUSCULAR | Status: AC | PRN
  Administered 2021-05-11: 30 mg via INTRAVENOUS

## 2021-05-11 MED ORDER — ORAL CARE MOUTH RINSE: 15.0000 mL | Freq: Once | OROMUCOSAL | Status: AC

## 2021-05-11 MED ORDER — FENTANYL CITRATE (PF) 100 MCG/2ML IJ SOLN: 100.0000 ug | Freq: Once | INTRAMUSCULAR | Status: AC

## 2021-05-11 MED ORDER — HYDROMORPHONE HCL 1 MG/ML IJ SOLN: 0.2500 mg | INTRAMUSCULAR | Status: DC | PRN

## 2021-05-11 MED ORDER — MIDAZOLAM HCL 2 MG/2ML IJ SOLN
INTRAMUSCULAR | Status: AC
  Administered 2021-05-11: 2 mg via INTRAVENOUS
  Filled 2021-05-11: qty 2

## 2021-05-11 MED ORDER — TRAMADOL HCL 50 MG PO TABS: 50.0000 mg | ORAL_TABLET | Freq: Four times a day (QID) | ORAL | 0 refills | Status: DC | PRN

## 2021-05-11 MED ORDER — LIDOCAINE 2% (20 MG/ML) 5 ML SYRINGE
INTRAMUSCULAR | Status: DC | PRN
  Administered 2021-05-11: 40 mg via INTRAVENOUS

## 2021-05-11 MED ORDER — ACETAMINOPHEN 500 MG PO TABS
ORAL_TABLET | ORAL | Status: AC
  Administered 2021-05-11: 1000 mg via ORAL
  Filled 2021-05-11: qty 2

## 2021-05-11 MED ORDER — OXYCODONE HCL 5 MG PO TABS: 5.0000 mg | ORAL_TABLET | Freq: Once | ORAL | Status: DC | PRN

## 2021-05-11 MED ORDER — OXYCODONE HCL 5 MG/5ML PO SOLN: 5.0000 mg | Freq: Once | ORAL | Status: DC | PRN

## 2021-05-11 MED ORDER — ONDANSETRON HCL 4 MG/2ML IJ SOLN: 4.0000 mg | Freq: Once | INTRAMUSCULAR | Status: DC | PRN

## 2021-05-11 MED ORDER — CEFAZOLIN SODIUM-DEXTROSE 2-4 GM/100ML-% IV SOLN
INTRAVENOUS | Status: AC
  Filled 2021-05-11: qty 100

## 2021-05-11 MED ORDER — CEFAZOLIN SODIUM-DEXTROSE 2-4 GM/100ML-% IV SOLN
2.0000 g | INTRAVENOUS | Status: AC
  Administered 2021-05-11: 2 g via INTRAVENOUS

## 2021-05-11 MED ORDER — BUPIVACAINE HCL (PF) 0.5 % IJ SOLN
INTRAMUSCULAR | Status: DC | PRN
  Administered 2021-05-11: 20 mL via PERINEURAL

## 2021-05-11 MED ORDER — MIDAZOLAM HCL 2 MG/2ML IJ SOLN: 2.0000 mg | Freq: Once | INTRAMUSCULAR | Status: AC

## 2021-05-11 MED ORDER — PHENYLEPHRINE 40 MCG/ML (10ML) SYRINGE FOR IV PUSH (FOR BLOOD PRESSURE SUPPORT)
PREFILLED_SYRINGE | INTRAVENOUS | Status: DC | PRN
  Administered 2021-05-11: 80 ug via INTRAVENOUS
  Administered 2021-05-11 (×3): 40 ug via INTRAVENOUS

## 2021-05-11 MED ORDER — ACETAMINOPHEN 500 MG PO TABS: 1000.0000 mg | ORAL_TABLET | ORAL | Status: AC

## 2021-05-11 MED ORDER — BUPIVACAINE LIPOSOME 1.3 % IJ SUSP
INTRAMUSCULAR | Status: DC | PRN
  Administered 2021-05-11: 10 mL via PERINEURAL

## 2021-05-11 MED ORDER — BUPIVACAINE-EPINEPHRINE 0.25% -1:200000 IJ SOLN
INTRAMUSCULAR | Status: DC | PRN
  Administered 2021-05-11: 10 mL

## 2021-05-11 MED ORDER — MAGTRACE LYMPHATIC TRACER
INTRAMUSCULAR | Status: DC | PRN
  Administered 2021-05-11: 2 mL via INTRAMUSCULAR

## 2021-05-11 MED ORDER — CHLORHEXIDINE GLUCONATE CLOTH 2 % EX PADS: 6.0000 | MEDICATED_PAD | Freq: Once | CUTANEOUS | Status: DC

## 2021-05-11 MED ORDER — ONDANSETRON HCL 4 MG/2ML IJ SOLN
INTRAMUSCULAR | Status: DC | PRN
Start: 2021-05-11 — End: 2021-05-11
  Administered 2021-05-11: 4 mg via INTRAVENOUS

## 2021-05-11 MED ORDER — FENTANYL CITRATE (PF) 250 MCG/5ML IJ SOLN
INTRAMUSCULAR | Status: DC | PRN
  Administered 2021-05-11: 50 ug via INTRAVENOUS
  Administered 2021-05-11: 25 ug via INTRAVENOUS
  Administered 2021-05-11: 50 ug via INTRAVENOUS

## 2021-05-11 MED ORDER — BUPIVACAINE HCL (PF) 0.25 % IJ SOLN
INTRAMUSCULAR | Status: AC
  Filled 2021-05-11: qty 30

## 2021-05-11 SURGICAL SUPPLY — 37 items
ADH SKN CLS APL DERMABOND .7 (GAUZE/BANDAGES/DRESSINGS) ×1
APL PRP STRL LF DISP 70% ISPRP (MISCELLANEOUS) ×1
APPLIER CLIP 9.375 MED OPEN (MISCELLANEOUS) ×2
APR CLP MED 9.3 20 MLT OPN (MISCELLANEOUS) ×1
BAG COUNTER SPONGE SURGICOUNT (BAG) ×2 IMPLANT
BAG SPNG CNTER NS LX DISP (BAG) ×1
CANISTER SUCT 3000ML PPV (MISCELLANEOUS) ×2 IMPLANT
CHLORAPREP W/TINT 26 (MISCELLANEOUS) ×2 IMPLANT
CLIP APPLIE 9.375 MED OPEN (MISCELLANEOUS) ×1 IMPLANT
COVER PROBE W GEL 5X96 (DRAPES) ×3 IMPLANT
COVER SURGICAL LIGHT HANDLE (MISCELLANEOUS) ×2 IMPLANT
DERMABOND ADVANCED (GAUZE/BANDAGES/DRESSINGS) ×1
DERMABOND ADVANCED .7 DNX12 (GAUZE/BANDAGES/DRESSINGS) ×1 IMPLANT
DEVICE DUBIN SPECIMEN MAMMOGRA (MISCELLANEOUS) ×2 IMPLANT
DRAPE CHEST BREAST 15X10 FENES (DRAPES) ×2 IMPLANT
ELECT CAUTERY BLADE 6.4 (BLADE) ×2 IMPLANT
ELECT REM PT RETURN 9FT ADLT (ELECTROSURGICAL) ×2
ELECTRODE REM PT RTRN 9FT ADLT (ELECTROSURGICAL) ×1 IMPLANT
GLOVE SURG SIGNA 7.5 PF LTX (GLOVE) ×2 IMPLANT
GOWN STRL REUS W/ TWL LRG LVL3 (GOWN DISPOSABLE) ×1 IMPLANT
GOWN STRL REUS W/ TWL XL LVL3 (GOWN DISPOSABLE) ×1 IMPLANT
GOWN STRL REUS W/TWL LRG LVL3 (GOWN DISPOSABLE) ×2
GOWN STRL REUS W/TWL XL LVL3 (GOWN DISPOSABLE) ×2
KIT BASIN OR (CUSTOM PROCEDURE TRAY) ×2 IMPLANT
KIT MARKER MARGIN INK (KITS) ×2 IMPLANT
NDL 18GX1X1/2 (RX/OR ONLY) (NEEDLE) IMPLANT
NDL HYPO 25GX1X1/2 BEV (NEEDLE) ×1 IMPLANT
NEEDLE 18GX1X1/2 (RX/OR ONLY) (NEEDLE) ×2 IMPLANT
NEEDLE HYPO 25GX1X1/2 BEV (NEEDLE) ×2 IMPLANT
NS IRRIG 1000ML POUR BTL (IV SOLUTION) ×2 IMPLANT
PACK GENERAL/GYN (CUSTOM PROCEDURE TRAY) ×2 IMPLANT
SUT MNCRL AB 4-0 PS2 18 (SUTURE) ×2 IMPLANT
SUT VIC AB 3-0 SH 18 (SUTURE) ×2 IMPLANT
SYR CONTROL 10ML LL (SYRINGE) ×2 IMPLANT
TOWEL GREEN STERILE (TOWEL DISPOSABLE) ×2 IMPLANT
TOWEL GREEN STERILE FF (TOWEL DISPOSABLE) ×2 IMPLANT
TRACER MAGTRACE VIAL (MISCELLANEOUS) ×1 IMPLANT

## 2021-05-11 NOTE — Interval H&P Note (Signed)
History and Physical Interval Note:no change in H and P ? ?05/11/2021 ?10:03 AM ? ?Natasha Ortega  has presented today for surgery, with the diagnosis of RIGHT BREAST CANCER.  The various methods of treatment have been discussed with the patient and family. After consideration of risks, benefits and other options for treatment, the patient has consented to  Procedure(s): ?RIGHT BREAST LUMPECTOMY WITH RADIOACTIVE SEED AND SENTINEL LYMPH NODE BIOPSY (Right) as a surgical intervention.  The patient's history has been reviewed, patient examined, no change in status, stable for surgery.  I have reviewed the patient's chart and labs.  Questions were answered to the patient's satisfaction.   ? ? ?[name] ? ? ?

## 2021-05-11 NOTE — Op Note (Signed)
? ?  Natasha Ortega ?05/11/2021 ? ? ?Pre-op Diagnosis: RIGHT BREAST CANCER ?    ?Post-op Diagnosis: same ? ?Procedure(s): ?RADIOACTIVE SEED GUIDED RIGHT BREAST LUMPECTOMY ?DEEP RIGHT AXILLARY SENTINEL LYMPH NODE BIOPSY ?INJECTION OF MAGTRACE FOR LYMPH NODE MAPPING ? ?Surgeon(s): ?[name], MD ? ?Anesthesia: General ? ?Staff:  ?Circulator: Kandra Nicolas, RN ?Scrub Person: Serita Grammes ? ?Estimated Blood Loss: Minimal ?              ?Specimens: sent to path ? ?Indications: This is a 51 year old female who was found to have a small 6 mm mass on screening mammography.  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  HER2 was questionably positive.  The decision was made to proceed with a radioactive seed guided lumpectomy and sentinel lymph node biopsy ? ?Procedure: The patient is brought to operating identifies correct patient.  She is placed upon the operating table general anesthesia was induced.  Under sterile technique, I then injected mag trace underneath the right nipple areolar complex.  The breast was then massaged for several minutes.  Her right breast and chest were then prepped and draped in usual sterile fashion.  Using the neoprobe the radioactive seed was located in the far right upper outer quadrant near the axilla.  I anesthetized the skin of the axilla with Marcaine and made incision in the axilla with a scalpel.  I then dissected medially into the breast tissue and stayed underneath the skin until I got to the area of the radioactive seed in the deep breast tissue.  I then performed a lumpectomy with the aid of neoprobe appearing to stay widely around the radioactive seed.  This was taken down to the chest wall.  I then completed a lumpectomy dissecting medial to lateral until the specimen was completely removed.  I then marked all margins with paint.  An x-ray was performed confirming the radioactive seed and previous tissue marker were in the specimen.  The  lumpectomy specimen was then sent to pathology for evaluation. ?Through the same incision I then started dissecting into the deep right axillary tissue.  With the aid of the mag trace probe I was then able to identify at least 2 sentinel lymph nodes which were excised with the surrounding fat using the electrocautery.  Several bridging veins were clipped with surgical clips as well.  The lymph nodes were sent to pathology for evaluation.  I palpated the axilla further and found no other enlarged lymph nodes and there was no further uptake of the mag trace.  Hemostasis was achieved in both sites with cautery.  I placed surgical clips around the lumpectomy cavity.  I then closed the subcutaneous tissue with interrupted 3-0 Vicryl sutures and closed the skin with a running 4-0 Monocryl.  Dermabond was then applied.  The patient tolerated the procedure well.  She was placed in a breast binder.  She was then extubated in the operating room and taken in stable condition to the recovery room. ?        ? ?[name]  ? ?Date: 05/11/2021  Time: 11:37 AM ? ? ? ?

## 2021-05-11 NOTE — Discharge Instructions (Signed)
[facility] Office Phone Number [phone]  BREAST BIOPSY/ PARTIAL MASTECTOMY: POST OP INSTRUCTIONS  Always review your discharge instruction sheet given to you by the facility where your surgery was performed.  IF YOU HAVE DISABILITY OR FAMILY LEAVE FORMS, YOU MUST BRING THEM TO THE OFFICE FOR PROCESSING.  DO NOT GIVE THEM TO YOUR DOCTOR.  A prescription for pain medication may be given to you upon discharge.  Take your pain medication as prescribed, if needed.  If narcotic pain medicine is not needed, then you may take acetaminophen (Tylenol) or ibuprofen (Advil) as needed. Take your usually prescribed medications unless otherwise directed If you need a refill on your pain medication, please contact your pharmacy.  They will contact our office to request authorization.  Prescriptions will not be filled after 5pm or on week-ends. You should eat very light the first 24 hours after surgery, such as soup, crackers, pudding, etc.  Resume your normal diet the day after surgery. Most patients will experience some swelling and bruising in the breast.  Ice packs and a good support bra will help.  Swelling and bruising can take several days to resolve.  It is common to experience some constipation if taking pain medication after surgery.  Increasing fluid intake and taking a stool softener will usually help or prevent this problem from occurring.  A mild laxative (Milk of Magnesia or Miralax) should be taken according to package directions if there are no bowel movements after 48 hours. Unless discharge instructions indicate otherwise, you may remove your bandages 24-48 hours after surgery, and you may shower at that time.  You may have steri-strips (small skin tapes) in place directly over the incision.  These strips should be left on the skin for 7-10 days.  If your surgeon used skin glue on the incision, you may shower in 24 hours.  The glue will flake off over the next 2-3 weeks.  Any  sutures or staples will be removed at the office during your follow-up visit. ACTIVITIES:  You may resume regular daily activities (gradually increasing) beginning the next day.  Wearing a good support bra or sports bra minimizes pain and swelling.  You may have sexual intercourse when it is comfortable. You may drive when you no longer are taking prescription pain medication, you can comfortably wear a seatbelt, and you can safely maneuver your car and apply brakes. RETURN TO WORK:  ______________________________________________________________________________________ You should see your doctor in the office for a follow-up appointment approximately two weeks after your surgery.  Your doctor's nurse will typically make your follow-up appointment when she calls you with your pathology report.  Expect your pathology report 2-3 business days after your surgery.  You may call to check if you do not hear from us after three days. OTHER INSTRUCTIONS: OK TO SHOWER STARTING TOMORROW ICE PACK, TYLENOL, AND IBUPROFEN ALSO FOR PAIN NO VIGOROUS ACTIVITY FOR ONE WEEK _______________________________________________________________________________________________ _____________________________________________________________________________________________________________________________________ _____________________________________________________________________________________________________________________________________ _____________________________________________________________________________________________________________________________________  WHEN TO CALL YOUR DOCTOR: Fever over 101.0 Nausea and/or vomiting. Extreme swelling or bruising. Continued bleeding from incision. Increased pain, redness, or drainage from the incision.  The clinic staff is available to answer your questions during regular business hours.  Please don't hesitate to call and ask to speak to one of the nurses for clinical  concerns.  If you have a medical emergency, go to the nearest emergency room or call 911.  A surgeon from [facility] is always on call at the hospital.  For further questions, please visit   [email or web address]   

## 2021-05-11 NOTE — Anesthesia Postprocedure Evaluation (Signed)
Anesthesia Post Note ? ?Patient: Natasha Ortega ? ?Procedure(s) Performed: RIGHT BREAST LUMPECTOMY WITH RADIOACTIVE SEED AND SENTINEL LYMPH NODE BIOPSY (Right: Breast) ? ?  ? ?Patient location during evaluation: PACU ?Anesthesia Type: Regional and General ?Level of consciousness: awake and alert, oriented and patient cooperative ?Pain management: pain level controlled ?Vital Signs Assessment: post-procedure vital signs reviewed and stable ?Respiratory status: spontaneous breathing, nonlabored ventilation and respiratory function stable ?Cardiovascular status: blood pressure returned to baseline and stable ?Postop Assessment: no apparent nausea or vomiting ?Anesthetic complications: no ? ? ?No notable events documented. ? ?Last Vitals:  ?Vitals:  ? 05/11/21 1200 05/11/21 1215  ?BP: 114/81 124/83  ?Pulse: (!) 59 61  ?Resp: 17 (!) 8  ?Temp:    ?SpO2: 100% 100%  ?  ?Last Pain:  ?Vitals:  ? 05/11/21 1215  ?TempSrc:   ?PainSc: 3   ? ? ?  ?  ?  ?  ?  ?  ? ?Jarome Matin Camiyah Friberg ? ? ? ? ?

## 2021-05-11 NOTE — Anesthesia Preprocedure Evaluation (Addendum)
Anesthesia Evaluation  ?Patient identified by MRN, date of birth, ID band ?Patient awake ? ? ? ?Reviewed: ?Allergy & Precautions, NPO status , Patient's Chart, lab work & pertinent test results ? ?Airway ?Mallampati: III ? ?TM Distance: >3 FB ?Neck ROM: Full ? ? ? Dental ? ?(+) Teeth Intact, Dental Advisory Given ?  ?Pulmonary ?neg pulmonary ROS,  ?  ?Pulmonary exam normal ?breath sounds clear to auscultation ? ? ? ? ? ? Cardiovascular ?negative cardio ROS ?Normal cardiovascular exam ?Rhythm:Regular Rate:Normal ? ? ?  ?Neuro/Psych ? Headaches, negative psych ROS  ? GI/Hepatic ?negative GI ROS, Neg liver ROS,   ?Endo/Other  ?negative endocrine ROS ? Renal/GU ?negative Renal ROS  ?negative genitourinary ?  ?Musculoskeletal ?negative musculoskeletal ROS ?(+)  ? Abdominal ?  ?Peds ? Hematology ?negative hematology ROS ?(+)   ?Anesthesia Other Findings ?R breast ca  ? Reproductive/Obstetrics ?negative OB ROS ? ?  ? ? ? ? ? ? ? ? ? ? ? ? ? ?  ?  ? ? ? ? ? ? ? ?Anesthesia Physical ?Anesthesia Plan ? ?ASA: 1 ? ?Anesthesia Plan: General and Regional  ? ?Post-op Pain Management: Regional block* and Tylenol PO (pre-op)*  ? ?Induction: Intravenous ? ?PONV Risk Score and Plan: 3 and Ondansetron, Dexamethasone, Midazolam and Treatment may vary due to age or medical condition ? ?Airway Management Planned: LMA ? ?Additional Equipment: None ? ?Intra-op Plan:  ? ?Post-operative Plan: Extubation in OR ? ?Informed Consent: I have reviewed the patients History and Physical, chart, labs and discussed the procedure including the risks, benefits and alternatives for the proposed anesthesia with the patient or authorized representative who has indicated his/her understanding and acceptance.  ? ? ? ?Dental advisory given ? ?Plan Discussed with: CRNA ? ?Anesthesia Plan Comments:   ? ? ? ? ? ?Anesthesia Quick Evaluation ? ?

## 2021-05-11 NOTE — Addendum Note (Signed)
Addendum  created 05/11/21 1303 by Dorthea Cove, CRNA  ? Intraprocedure Meds edited  ?  ?

## 2021-05-11 NOTE — Anesthesia Procedure Notes (Signed)
Anesthesia Regional Block: Pectoralis block  ? ?Pre-Anesthetic Checklist: , timeout performed,  Correct Patient, Correct Site, Correct Laterality,  Correct Procedure, Correct Position, site marked,  Risks and benefits discussed,  Surgical consent,  Pre-op evaluation,  At surgeon's request and post-op pain management ? ?Laterality: Right ? ?Prep: Maximum Sterile Barrier Precautions used, chloraprep     ?  ?Needles:  ?Injection technique: Single-shot ? ?Needle Type: Echogenic Stimulator Needle   ? ? ?Needle Length: 9cm  ?Needle Gauge: 22  ? ? ? ?Additional Needles: ? ? ?Procedures:,,,, ultrasound used (permanent image in chart),,    ?Narrative:  ?Start time: 05/11/2021 9:50 AM ?End time: 05/11/2021 9:55 AM ?Injection made incrementally with aspirations every 5 mL. ? ?Performed by: Personally  ?Anesthesiologist: Pervis Hocking, DO ? ?Additional Notes: ?Monitors applied. No increased pain on injection. No increased resistance to injection. Injection made in 5cc increments. Good needle visualization. Patient tolerated procedure well.  ? ? ? ? ?

## 2021-05-11 NOTE — Transfer of Care (Signed)
Immediate Anesthesia Transfer of Care Note ? ?Patient: Natasha Ortega ? ?Procedure(s) Performed: RIGHT BREAST LUMPECTOMY WITH RADIOACTIVE SEED AND SENTINEL LYMPH NODE BIOPSY (Right: Breast) ? ?Patient Location: PACU ? ?Anesthesia Type:General ? ?Level of Consciousness: awake, alert  and oriented ? ?Airway & Oxygen Therapy: Patient Spontanous Breathing ? ?Post-op Assessment: Report given to RN and Post -op Vital signs reviewed and stable ? ?Post vital signs: Reviewed and stable ? ?Last Vitals:  ?Vitals Value Taken Time  ?BP 117/80 05/11/21 1147  ?Temp    ?Pulse 68 05/11/21 1148  ?Resp 55 05/11/21 1148  ?SpO2 100 % 05/11/21 1148  ?Vitals shown include unvalidated device data. ? ?Last Pain:  ?Vitals:  ? 05/11/21 0840  ?TempSrc:   ?PainSc: 0-No pain  ?   ? ?  ? ?Complications: No notable events documented. ?

## 2021-05-11 NOTE — Anesthesia Procedure Notes (Signed)
Procedure Name: LMA Insertion ?Date/Time: 05/11/2021 10:34 AM ?Performed by: Dorthea Cove, CRNA ?Pre-anesthesia Checklist: Patient identified, Emergency Drugs available, Suction available and Patient being monitored ?Patient Re-evaluated:Patient Re-evaluated prior to induction ?Oxygen Delivery Method: Circle System Utilized ?Preoxygenation: Pre-oxygenation with 100% oxygen ?Induction Type: IV induction ?Ventilation: Mask ventilation without difficulty ?LMA: LMA inserted ?LMA Size: 4.0 ?Number of attempts: 1 ?Airway Equipment and Method: Bite block ?Placement Confirmation: positive ETCO2 ?Tube secured with: Tape ?Dental Injury: Teeth and Oropharynx as per pre-operative assessment  ? ? ? ? ?

## 2021-05-12 ENCOUNTER — Encounter (HOSPITAL_COMMUNITY): Payer: Self-pay | Admitting: Surgery

## 2021-05-12 ENCOUNTER — Other Ambulatory Visit: Payer: Self-pay

## 2021-05-16 ENCOUNTER — Encounter: Payer: Self-pay | Admitting: *Deleted

## 2021-05-18 ENCOUNTER — Other Ambulatory Visit: Payer: Self-pay | Admitting: Surgery

## 2021-05-19 ENCOUNTER — Encounter: Payer: Self-pay | Admitting: *Deleted

## 2021-05-19 LAB — SURGICAL PATHOLOGY

## 2021-05-19 NOTE — Progress Notes (Signed)
? ?Patient Care Team: ?Louretta Shorten, MD as PCP - General (Obstetrics and Gynecology) ? ?DIAGNOSIS:  ?Encounter Diagnosis  ?Name Primary?  ? Malignant neoplasm of upper-outer quadrant of right breast in female, estrogen receptor negative ([place])   ? ? ?SUMMARY OF ONCOLOGIC HISTORY: ?Oncology History  ?Malignant neoplasm of upper-outer quadrant of right breast in female, estrogen receptor negative ([place])  ?04/21/2021 Initial Diagnosis  ? Screening mammogram detected right breast mass 6 mm by ultrasound, axilla negative, biopsy: Grade 2 IDC ER 0%, PR 0%, HER2 2+ by IHC ?  ?05/11/2021 Surgery  ? Right lumpectomy: Grade 2 IDC 0.9 cm, margins negative, angiolymphatic invasion present, 1 lymph node with micrometastases, 0/2 other lymph nodes negative, ER 0%, PR 0%, HER2 negative by FISH Ki-67 25% (final pathology is negative for HER2) ?  ? ? ?CHIEF COMPLIANT: Discuss final pathology results ? ?INTERVAL HISTORY: Natasha Ortega is 51 y.o. female is here because of recent diagnosis of right-sided breast cancer.  She had a routine mammogram that detected an abnormality in the right breast for which she underwent additional mammograms and ultrasound. She state she felt well after surgery. She denies pain and states that she had no problem sleeping. ? ? ?ALLERGIES:  is allergic to penicillins. ? ?MEDICATIONS:  ?Current Outpatient Medications  ?Medication Sig Dispense Refill  ? Multiple Vitamins-Minerals (MULTIVITAMIN WITH MINERALS) tablet Take 1 tablet by mouth daily.    ? traMADol (ULTRAM) 50 MG tablet Take 1 tablet (50 mg total) by mouth every 6 (six) hours as needed for moderate pain. 25 tablet 0  ? ?No current facility-administered medications for this visit.  ? ? ?PHYSICAL EXAMINATION: ?ECOG PERFORMANCE STATUS: 1 - Symptomatic but completely ambulatory ? ?Vitals:  ? 05/20/21 1041  ?BP: (!) 132/97  ?Pulse: 73  ?Resp: 18  ?Temp: 97.9 ?F (36.6 ?C)  ?SpO2: 98%  ? ?Filed Weights  ? 05/20/21 1041  ?Weight: 191 lb 6.4 oz (86.8 kg)  ? ?   ? ?LABORATORY DATA:  ?I have reviewed the data as listed ?   ? View : No data to display.  ?  ?  ?  ? ? ?Lab Results  ?Component Value Date  ? WBC 5.8 05/11/2021  ? HGB 15.1 (H) 05/11/2021  ? HCT 45.9 05/11/2021  ? MCV 92.2 05/11/2021  ? PLT 353 05/11/2021  ? ? ?ASSESSMENT & PLAN:  ?Malignant neoplasm of upper-outer quadrant of right breast in female, estrogen receptor negative ([place]) ?04/21/2021:Screening mammogram detected right breast mass 6 mm by ultrasound, axilla negative, biopsy: Grade 2 IDC ER 0%, PR 0%, HER2 2+ by IHC ?05/11/2021:Right lumpectomy: Grade 2 IDC 0.9 cm, margins negative, angiolymphatic invasion present, 1 lymph node with micrometastases, 0/2 other lymph nodes negative, ER 0%, PR 0%, HER2 2+ by IHC and FISH negative with a ratio 1.33 Ki-67 25% ? ?Treatment plan: ?1.  Adjuvant chemotherapy with Taxotere Cytoxan every 3 weeks x4 ?2.  Adjuvant radiation ?  ?Genetic testing ?Port placement, echo, chemo class ?Discussed dignity And recommended genetic testing and chemo education ?Return to clinic to start chemotherapy ? ? ? ?No orders of the defined types were placed in this encounter. ? ?The patient has a good understanding of the overall plan. she agrees with it. she will call with any problems that may develop before the next visit here. ?Total time spent: 30 mins including face to face time and time spent for planning, charting and co-ordination of care ? ? Harriette Ohara, MD ?05/20/21 ? ? ? I  Gardiner Coins am scribing for Dr. Lindi Adie ? ?I have reviewed the above documentation for accuracy and completeness, and I agree with the above. ?  ?

## 2021-05-19 NOTE — Assessment & Plan Note (Addendum)
04/21/2021:Screening mammogram detected right breast mass 6 mm by ultrasound, axilla negative, biopsy: Grade 2 IDC ER 0%, PR 0%, HER2 2+ by IHC ?05/11/2021:Right lumpectomy: Grade 2 IDC 0.9 cm, margins negative, angiolymphatic invasion present, 1 lymph node with micrometastases, 0/2 other lymph nodes negative, ER 0%, PR 0%, HER2 2+ by IHC and FISH negative with a ratio 1.33 Ki-67 25% ? ?Treatment plan: ?1.  Adjuvant chemotherapy with Taxotere Cytoxan every 3 weeks x4 ?2.  Adjuvant radiation ?? ?Genetic testing ?Port placement, echo, chemo class ?? ?Return to clinic to start chemotherapy ?

## 2021-05-20 ENCOUNTER — Inpatient Hospital Stay (HOSPITAL_BASED_OUTPATIENT_CLINIC_OR_DEPARTMENT_OTHER): Payer: BC Managed Care – PPO | Admitting: Hematology and Oncology

## 2021-05-20 ENCOUNTER — Other Ambulatory Visit: Payer: Self-pay

## 2021-05-20 VITALS — BP 132/97 | HR 73 | Temp 97.9°F | Resp 18 | Ht 64.0 in | Wt 191.4 lb

## 2021-05-20 DIAGNOSIS — C50411 Malignant neoplasm of upper-outer quadrant of right female breast: Secondary | ICD-10-CM

## 2021-05-20 DIAGNOSIS — Z171 Estrogen receptor negative status [ER-]: Secondary | ICD-10-CM

## 2021-05-20 MED ORDER — LIDOCAINE-PRILOCAINE 2.5-2.5 % EX CREA: TOPICAL_CREAM | CUTANEOUS | 3 refills | Status: DC

## 2021-05-20 MED ORDER — DEXAMETHASONE 4 MG PO TABS: 4.0000 mg | ORAL_TABLET | Freq: Two times a day (BID) | ORAL | 0 refills | Status: DC

## 2021-05-20 MED ORDER — ONDANSETRON HCL 8 MG PO TABS: 8.0000 mg | ORAL_TABLET | Freq: Two times a day (BID) | ORAL | 1 refills | Status: DC | PRN

## 2021-05-20 MED ORDER — PROCHLORPERAZINE MALEATE 10 MG PO TABS: 10.0000 mg | ORAL_TABLET | Freq: Four times a day (QID) | ORAL | 1 refills | Status: DC | PRN

## 2021-05-20 NOTE — Research (Signed)
Trial:  [id number] - TREATMENT OF REFRACTORY NAUSEA ?Patient Natasha Ortega was identified by Dr Lindi Adie as a potential candidate for the above listed study.  This Clinical Research Nurse met with Natasha Ortega, [id number], on 05/20/21 in a manner and location that ensures patient privacy to discuss participation in the above listed research study.  Patient is Accompanied by her husband .  A copy of the informed consent document and separate HIPAA Authorization was provided to the patient.  Patient reads, speaks, and understands [place].   ?Patient was provided with the business card of this Nurse and encouraged to contact the research team with any questions.  Approximately 15 minutes were spent with the patient reviewing the informed consent documents.  Patient was provided the option of taking informed consent documents home to review and was encouraged to review at their convenience with their support network, including other care providers. Patient took the consent documents home to review. ?Plan is to start chemo 06/20/21. Ms Derrick will be out of town for spring break; plan made with her to follow up via phone on 06/06/21, after she returns. She has my contact information, including phone number and email address. Confirmed best phone number is [phone]. ?Marjie Skiff. Frank Pilger, RN, BSN, CHPN ?She  Her  Hers ?Clinical Research Nurse ?[facility] ?Direct Dial [phone]  Pager [phone] ?05/20/2021 12:06 PM ?

## 2021-05-20 NOTE — Progress Notes (Signed)
DISCONTINUE ON PATHWAY REGIMEN - Breast ? ?No Medical Intervention - Off Treatment. ? ?REASON: Other Reason ?PRIOR TREATMENT: Off Treatment ? ?START ON PATHWAY REGIMEN - Breast ? ? ?  A cycle is every 21 days: ?    Docetaxel  ?    Cyclophosphamide  ? ?**Always confirm dose/schedule in your pharmacy ordering system** ? ?Patient Characteristics: ?Postoperative without Neoadjuvant Therapy (Pathologic Staging), Invasive Disease, Adjuvant Therapy, HER2 Negative/Unknown/Equivocal, ER Negative/Unknown, Node Negative, pT1a-c, N67m or pT1c or Higher, pN0 ?Therapeutic Status: Postoperative without Neoadjuvant Therapy (Pathologic Staging) ?AJCC Grade: G2 ?AJCC N Category: pN177m?AJCC M Category: cM0 ?ER Status: Negative (-) ?AJCC 8 Stage Grouping: IB ?HER2 Status: Negative (-) ?Oncotype Dx Recurrence Score: Not Appropriate ?AJCC T Category: pT1c ?PR Status: Negative (-) ?Adjuvant Therapy Status: No Adjuvant Therapy Received Yet or Changing Initial Adjuvant Regimen due to Tolerance ?Intent of Therapy: ?Curative Intent, Discussed with Patient ?

## 2021-05-23 ENCOUNTER — Encounter: Payer: Self-pay | Admitting: *Deleted

## 2021-05-25 ENCOUNTER — Encounter (HOSPITAL_COMMUNITY): Payer: Self-pay

## 2021-05-27 ENCOUNTER — Inpatient Hospital Stay: Payer: BC Managed Care – PPO | Attending: Hematology and Oncology

## 2021-05-27 ENCOUNTER — Other Ambulatory Visit: Payer: Self-pay

## 2021-05-30 ENCOUNTER — Encounter: Payer: Self-pay | Admitting: *Deleted

## 2021-05-31 ENCOUNTER — Encounter (HOSPITAL_BASED_OUTPATIENT_CLINIC_OR_DEPARTMENT_OTHER): Payer: Self-pay | Admitting: Surgery

## 2021-05-31 ENCOUNTER — Other Ambulatory Visit: Payer: Self-pay

## 2021-06-02 ENCOUNTER — Telehealth: Payer: Self-pay | Admitting: *Deleted

## 2021-06-02 NOTE — Telephone Encounter (Signed)
Pt called and left vm to return call. ?Return pt call, no answer, left vm with contact information to return call at pt convenience  ?

## 2021-06-03 ENCOUNTER — Telehealth: Payer: Self-pay | Admitting: Hematology and Oncology

## 2021-06-03 ENCOUNTER — Telehealth: Payer: Self-pay | Admitting: *Deleted

## 2021-06-03 ENCOUNTER — Encounter: Payer: Self-pay | Admitting: Hematology and Oncology

## 2021-06-03 NOTE — Telephone Encounter (Signed)
Pt called requesting 1st chemo be moved to 5/3 as pt prefers Wed appt. Scheduling msg sent to move appts from 5/1 to 5/3 as well as injection appt to 5/5. Remainder of tx noted to be on Wed. No further needs voiced at this time ?

## 2021-06-03 NOTE — Telephone Encounter (Signed)
Per 4/14 in basket Called and spoke to pt and confirmed her new appointments  Pt confirmed appointments  ?

## 2021-06-05 NOTE — Therapy (Signed)
?OUTPATIENT PHYSICAL THERAPY BREAST CANCER POST OP FOLLOW UP ? ? ?Patient Name: Natasha Ortega ?MRN: [id number] ?DOB:09-13-1970, 51 y.o., female ?Today's Date: 06/06/2021 ? ? PT End of Session - 06/06/21 1631   ? ? Visit Number 2   ? Number of Visits 10   ? Date for PT Re-Evaluation 07/04/21   ? PT Start Time 1511   ? PT Stop Time 1620   ? PT Time Calculation (min) 69 min   ? Activity Tolerance Patient tolerated treatment well   ? Behavior During Therapy [facility] for tasks assessed/performed   ? ?  ?  ? ?  ? ? ?Past Medical History:  ?Diagnosis Date  ? Cancer [facility])   ? breast  ? Headache   ? migraines  ? ?Past Surgical History:  ?Procedure Laterality Date  ? BREAST LUMPECTOMY WITH RADIOACTIVE SEED AND SENTINEL LYMPH NODE BIOPSY Right 05/11/2021  ? Procedure: RIGHT BREAST LUMPECTOMY WITH RADIOACTIVE SEED AND SENTINEL LYMPH NODE BIOPSY;  Surgeon: [name], MD;  Location: [place];  Service: General;  Laterality: Right;  ? ?Patient Active Problem List  ? Diagnosis Date Noted  ? Genetic testing 05/09/2021  ? Family history of breast cancer 05/05/2021  ? Malignant neoplasm of upper-outer quadrant of right breast in female, estrogen receptor negative ([place]) 04/28/2021  ? ? ?PCP: Louretta Shorten, MD ? ?REFERRING PROVIDER: Nicholas Lose, MD ? ?REFERRING DIAG: Right breast cancer ?THERAPY DIAG:  ?Malignant neoplasm of upper-outer quadrant of right breast in female, estrogen receptor negative ([place]) ? ?Abnormal posture ? ?Aftercare following surgery for neoplasm ? ?ONSET DATE: 05/11/2021 ? ?SUBJECTIVE:                                                                                                                                                                                          ? ?SUBJECTIVE STATEMENT: ?Patient reports she underwent a right lumpectomy and sentinel node biopsy on 05/11/2021. She had 1 node with micromets and 2 negative nodes (3 total) removed. Her cancer is triple negative so she will proceed with chemotherapy  followed by radiation. She will begin chemotherapy 06/22/2021 every 3 weeks for 4 treatments. ? ?PERTINENT HISTORY:  ?Patient was diagnosed on 04/28/2021 with right grade 2 invasive ductal carcinoma breast cancer. She underwent a right lumpectomy and sentinel node biopsy on 05/11/2021. She had 1 node with micromets and 2 negative nodes (3 total) removed. It is ER/PR negative and HER2 negative with a Ki67 of 40%.  ? ?PATIENT GOALS:  Reassess how my recovery is going related to arm function, pain, and swelling. ? ?PAIN:  ?Are you having pain? Yes: NPRS scale: 4/10 ?Pain location:  right proximal lateral trunk ?Pain description: soreness ?Aggravating factors: cleaning ?Relieving factors: touching the area ? ?PRECAUTIONS: Recent Surgery, right UE Lymphedema risk ? ?ACTIVITY LEVEL / LEISURE: She is walking 4 days per week for about 30 min. ? ? ?OBJECTIVE:  ? ?PATIENT SURVEYS:  ?QUICK DASH: ? Katina Dung - 06/06/21 0001   ? ? Open a tight or new jar No difficulty   ? Do heavy household chores (wash walls, wash floors) Mild difficulty   ? Carry a shopping bag or briefcase No difficulty   ? Wash your back Moderate difficulty   ? Use a knife to cut food No difficulty   ? Recreational activities in which you take some force or impact through your arm, shoulder, or hand (golf, hammering, tennis) Mild difficulty   ? During the past week, to what extent has your arm, shoulder or hand problem interfered with your normal social activities with family, friends, neighbors, or groups? Slightly   ? During the past week, to what extent has your arm, shoulder or hand problem limited your work or other regular daily activities Slightly   ? Arm, shoulder, or hand pain. None   ? Tingling (pins and needles) in your arm, shoulder, or hand None   ? Difficulty Sleeping No difficulty   ? DASH Score 13.64 %   ? ?  ?  ? ?  ? ? ? ?OBSERVATIONS: ? Incision is well healed in her left lateral breast. Glue still present on incision. She has lateral breast  cording (see photo).  ? ?POSTURE:  ?Forward head and rounded shoulders posture ?  ?UPPER EXTREMITY AROM/PROM: ?  ?A/PROM RIGHT  05/05/2021 ?  RIGHT 06/06/2021  ?Shoulder extension 49 45  ?Shoulder flexion 152 119  ?Shoulder abduction 164 130  ?Shoulder internal rotation 63 53  ?Shoulder external rotation 88 78  ?                        (Blank rows = not tested) ?  ?A/PROM LEFT  05/05/2021  ?Shoulder extension 60  ?Shoulder flexion 149  ?Shoulder abduction 162  ?Shoulder internal rotation 61  ?Shoulder external rotation 89  ?                        (Blank rows = not tested) ?  ?  ? ? ?CERVICAL AROM: ?All within normal limits ? ?PALPATION: ?Patient is very tender to palpation in right lateral ribs and just inferior to her right breast along her rib. ?  ?UPPER EXTREMITY STRENGTH: WNL ?  ?  ?LYMPHEDEMA ASSESSMENTS:  ?  ?LANDMARK RIGHT  05/05/2021 RIGHT 06/06/2021  ?10 cm proximal to olecranon process 34.5 33.4  ?Olecranon process 26.9 26.8  ?10 cm proximal to ulnar styloid process 23.7 23.5  ?Just proximal to ulnar styloid process 16 15.9  ?Across hand at thumb web space 19.5 18.9  ?At base of 2nd digit 6.2 6.2  ?(Blank rows = not tested) ?  ?[place] LEFT  05/05/2021 LEFT 06/06/2021  ?10 cm proximal to olecranon process 34.2 34  ?Olecranon process 26 26.1  ?10 cm proximal to ulnar styloid process 22.8 23  ?Just proximal to ulnar styloid process 16.3 16.1  ?Across hand at thumb web space 19.5 18.9  ?At base of 2nd digit 6.2 6.1  ?(Blank rows = not tested) ?  ?  ? ? ?  ?Surgery type/Date: Right lumpectomy and sentinel node biopsy 05/11/2021 ?Number of lymph nodes  removed: 3 ?Current/past treatment (chemo, radiation, hormone therapy): none ?Other symptoms:  ?Heaviness/tightness Yes ?Pain Yes ?Pitting edema No ?Infections No ?Decreased scar mobility Yes ?Stemmer sign No ? ? ?PATIENT EDUCATION:  ?Education details: HEP and lymphedema education ?Person educated: Patient ?Education method: Explanation, Demonstration, and  Handouts ?Education comprehension: verbalized understanding and returned demonstration ? ? ?HOME EXERCISE PROGRAM: ? Reviewed previously given post op HEP. Issued closed chain HEP. ? ? ?ASSESSMENT: ? ?CLINICAL IMPRESSION: ?Patient is healing well s/p right lumpectomy and sentinel node biopsy (2 negative nodes and 1 nodes with micromets) on 05/11/2021. She has visible cording present in her right lateral breast. Her right lateral trunk soreness is contributing to her shoulder ROM deficits and she is very tender to palpation in that area. She will benefit from PT to regain shoulder ROM and function, address cording, and reduce pain and edema in the lateral trunk region. ? ?Pt will benefit from skilled therapeutic intervention to improve on the following deficits: Decreased knowledge of precautions, impaired UE functional use, pain, decreased ROM, postural dysfunction.  ? ?PT treatment/interventions: ADL/Self care home management, Therapeutic exercises, Therapeutic activity, Neuromuscular re-education, Balance training, Gait training, Patient/Family education, Joint mobilization, Manual lymph drainage, scar mobilization, and Manual therapy ? ? ? ? ?GOALS: ?Goals reviewed with patient? Yes ? ?LONG TERM GOALS:  (STG=LTG) ? ?GOALS Name Target Date Goal status  ?1 Pt will demonstrate she has regained full shoulder ROM and function post operatively compared to baselines.  ?Baseline: 06/30/2021 IN PROGRESS  ?2 Patient will increase right flexion and abduction to >/= 150 degrees for increased ease reaching overhead. 07/04/2021 INITIAL  ?3 Patient will improve her DASH score to be </= 5 for improved overall UE function. 07/04/2021 INITIAL  ?4 Patient will demonstrate her visible right breast cording has resolved (compared to photo taken today) 07/04/2021 INITIAL  ?5 Patient will report >/= 25% improvement in right lateral trunk pain to tolerate cleaning and home tasks. 07/04/2021 INITIAL  ? ? ? ?PLAN: ?PT FREQUENCY/DURATION: 2x/week  for 4 weeks ? ?PLAN FOR NEXT SESSION: Begin myofascial release on right lateral breast; AAROM exercises for lateral trunk stretching. ? ? ?[place] ? [address], [address] ? [place] [address]

## 2021-06-06 ENCOUNTER — Encounter: Payer: Self-pay | Admitting: Physical Therapy

## 2021-06-06 ENCOUNTER — Ambulatory Visit: Payer: BC Managed Care – PPO | Attending: Hematology and Oncology | Admitting: Physical Therapy

## 2021-06-06 DIAGNOSIS — C50411 Malignant neoplasm of upper-outer quadrant of right female breast: Secondary | ICD-10-CM | POA: Insufficient documentation

## 2021-06-06 DIAGNOSIS — Z483 Aftercare following surgery for neoplasm: Secondary | ICD-10-CM | POA: Diagnosis present

## 2021-06-06 DIAGNOSIS — R293 Abnormal posture: Secondary | ICD-10-CM | POA: Insufficient documentation

## 2021-06-06 DIAGNOSIS — Z171 Estrogen receptor negative status [ER-]: Secondary | ICD-10-CM | POA: Insufficient documentation

## 2021-06-06 NOTE — Patient Instructions (Addendum)
Closed Chain: Shoulder Abduction / Adduction - on Wall ? ? ? ?One hand on wall, step to side and return. Stepping causes shoulder to abduct and adduct. ?Step _5__ times, holding 5 seconds, _2__ times per day. ? ?http://ss.exer.us/267  ? ?Copyright ? VHI. All rights reserved.  ?Closed Chain: Shoulder Flexion / Extension - on Wall ? ? ? ?Hands on wall, step backward. Return. Stepping causes shoulder flexion and extension ?Step _5__ times, holding 5 seconds, _2__ times per day. ? ?http://ss.exer.us/265  ? ?Copyright ? VHI. All rights reserved.  ? ? ? ?After Breast Cancer Class ?It is recommended you attend the ABC class to be educated on lymphedema risk reduction. This class is free of charge and lasts for 1 hour. It is a 1-time class. You will need to download the Webex app either on your phone or computer. We will send you a link the night before or the morning of the class. You should be able to click on that link to join the class. This is not a confidential class. You don't have to turn your camera on, but other participants may be able to see your email address. You're scheduled for May 1st at 11:00. ? ?Scar massage ?You can begin gentle scar massage to you incision sites. Gently place one hand on the incision and move the skin (without sliding on the skin) in various directions. Do this for a few minutes and then you can gently massage either coconut oil or vitamin E cream into the scars. ? ?Compression garment ?You should continue wearing your compression bra until you feel like you no longer have swelling. ? ?Home exercise Program ?Continue doing the exercises you were given until you feel like you can do them without feeling any tightness at the end.  ? ?Walking Program ?Studies show that 30 minutes of walking per day (fast enough to elevate your heart rate) can significantly reduce the risk of a cancer recurrence. If you can't walk due to other medical reasons, we encourage you to find another activity you  could do (like a stationary bike or water exercise). ? ?Posture ?After breast cancer surgery, people frequently sit with rounded shoulders posture because it puts their incisions on slack and feels better. If you sit like this and scar tissue forms in that position, you can become very tight and have pain sitting or standing with good posture. Try to be aware of your posture and sit and stand up tall to heal properly. ? ?Follow up PT: ?It is recommended you return every 3 months for the first 3 years following surgery to be assessed on the SOZO machine for an L-Dex score. This helps prevent clinically significant lymphedema in 95% of patients. These follow up screens are 10 minute appointments that you are not billed for. You are scheduled for June 19th at 1:00. ?

## 2021-06-07 ENCOUNTER — Ambulatory Visit: Payer: BC Managed Care – PPO

## 2021-06-07 ENCOUNTER — Encounter: Payer: Self-pay | Admitting: Hematology and Oncology

## 2021-06-07 DIAGNOSIS — Z483 Aftercare following surgery for neoplasm: Secondary | ICD-10-CM

## 2021-06-07 DIAGNOSIS — R293 Abnormal posture: Secondary | ICD-10-CM

## 2021-06-07 DIAGNOSIS — C50411 Malignant neoplasm of upper-outer quadrant of right female breast: Secondary | ICD-10-CM | POA: Diagnosis not present

## 2021-06-07 DIAGNOSIS — Z171 Estrogen receptor negative status [ER-]: Secondary | ICD-10-CM

## 2021-06-07 NOTE — Therapy (Signed)
?OUTPATIENT PHYSICAL THERAPY TREATMENT NOTE ? ? ?Patient Name: Natasha Ortega ?MRN: [id number] ?DOB:1970/11/23, 51 y.o., female ?Today's Date: 06/07/2021 ? ?PCP: Louretta Shorten, MD ?REFERRING PROVIDER: Nicholas Lose, MD ? ?END OF SESSION:  ? PT End of Session - 06/07/21 0909   ? ? Visit Number 3   ? Number of Visits 10   ? Date for PT Re-Evaluation 07/04/21   ? PT Start Time [phone]   ? PT Stop Time 1001   ? PT Time Calculation (min) 58 min   ? Activity Tolerance Patient tolerated treatment well   ? Behavior During Therapy [facility] for tasks assessed/performed   ? ?  ?  ? ?  ? ? ?Past Medical History:  ?Diagnosis Date  ? Cancer [facility])   ? breast  ? Headache   ? migraines  ? ?Past Surgical History:  ?Procedure Laterality Date  ? BREAST LUMPECTOMY WITH RADIOACTIVE SEED AND SENTINEL LYMPH NODE BIOPSY Right 05/11/2021  ? Procedure: RIGHT BREAST LUMPECTOMY WITH RADIOACTIVE SEED AND SENTINEL LYMPH NODE BIOPSY;  Surgeon: [name], MD;  Location: [place];  Service: General;  Laterality: Right;  ? ?Patient Active Problem List  ? Diagnosis Date Noted  ? Genetic testing 05/09/2021  ? Family history of breast cancer 05/05/2021  ? Malignant neoplasm of upper-outer quadrant of right breast in female, estrogen receptor negative ([place]) 04/28/2021  ? ? ?REFERRING DIAG: Right breast cancer ? ?THERAPY DIAG:  ?Malignant neoplasm of upper-outer quadrant of right breast in female, estrogen receptor negative ([place]) ? ?Abnormal posture ? ?Aftercare following surgery for neoplasm ? ?PERTINENT HISTORY: Patient was diagnosed on 04/28/2021 with right grade 2 invasive ductal carcinoma breast cancer. She underwent a right lumpectomy and sentinel node biopsy on 05/11/2021. She had 1 node with micromets and 2 negative nodes (3 total) removed. It is ER/PR negative and HER2 negative with a Ki67 of 40%.  ? ?PRECAUTIONS: Recent Surgery, right UE Lymphedema risk ? ?SUBJECTIVE: I can tell from wearing the foam she gave me yesterday that I already feel a little  better. My sensitivity is also a little improved just from her briefly working on it yesterday to let me know what to expect.  ? ?PAIN:  ?Are you having pain? No ? ? ? ?OBJECTIVE:  ?  ?PATIENT SURVEYS:  ?QUICK DASH: ?  Katina Dung - 06/06/21 0001   ?  ?  Open a tight or new jar No difficulty   ?  Do heavy household chores (wash walls, wash floors) Mild difficulty   ?  Carry a shopping bag or briefcase No difficulty   ?  Wash your back Moderate difficulty   ?  Use a knife to cut food No difficulty   ?  Recreational activities in which you take some force or impact through your arm, shoulder, or hand (golf, hammering, tennis) Mild difficulty   ?  During the past week, to what extent has your arm, shoulder or hand problem interfered with your normal social activities with family, friends, neighbors, or groups? Slightly   ?  During the past week, to what extent has your arm, shoulder or hand problem limited your work or other regular daily activities Slightly   ?  Arm, shoulder, or hand pain. None   ?  Tingling (pins and needles) in your arm, shoulder, or hand None   ?  Difficulty Sleeping No difficulty   ?  DASH Score 13.64 %   ?  ?   ?  ?  ?   ?  ?  ?  ?  OBSERVATIONS: ?           Incision is well healed in her left lateral breast. Glue still present on incision. She has lateral breast cording (see photo).  ?  ?POSTURE:  ?Forward head and rounded shoulders posture ?  ?UPPER EXTREMITY AROM/PROM: ?  ?A/PROM RIGHT  05/05/2021 ?  RIGHT 06/06/2021  ?Shoulder extension 49 45  ?Shoulder flexion 152 119  ?Shoulder abduction 164 130  ?Shoulder internal rotation 63 53  ?Shoulder external rotation 88 78  ?                        (Blank rows = not tested) ?  ?A/PROM LEFT  05/05/2021  ?Shoulder extension 60  ?Shoulder flexion 149  ?Shoulder abduction 162  ?Shoulder internal rotation 61  ?Shoulder external rotation 89  ?                        (Blank rows = not tested) ?  ?  ? ?  ?CERVICAL AROM: ?All within normal limits ?   ?PALPATION: ?Patient is very tender to palpation in right lateral ribs and just inferior to her right breast along her rib. ?  ?UPPER EXTREMITY STRENGTH: WNL ?  ?  ?LYMPHEDEMA ASSESSMENTS:  ?  ?LANDMARK RIGHT  05/05/2021 RIGHT 06/06/2021  ?10 cm proximal to olecranon process 34.5 33.4  ?Olecranon process 26.9 26.8  ?10 cm proximal to ulnar styloid process 23.7 23.5  ?Just proximal to ulnar styloid process 16 15.9  ?Across hand at thumb web space 19.5 18.9  ?At base of 2nd digit 6.2 6.2  ?(Blank rows = not tested) ?  ?[place] LEFT  05/05/2021 LEFT 06/06/2021  ?10 cm proximal to olecranon process 34.2 34  ?Olecranon process 26 26.1  ?10 cm proximal to ulnar styloid process 22.8 23  ?Just proximal to ulnar styloid process 16.3 16.1  ?Across hand at thumb web space 19.5 18.9  ?At base of 2nd digit 6.2 6.1  ?(Blank rows = not tested) ?  ?Todays Treatment:  ?06/07/21: ?Manual Therapy: ?MFR: In Supine to Rt axilla and lateral breast at areas of cording, and then also in Lt S/L to Rt lateral breast ?P/ROM: To Rt shoulder into flexion, abd and D2 to pts tolerance ?STM: In Lt S/L to Rt periscapular area and lateral trunk with cocoa butter, mild pressure as pt is tender to touch at lateral trunk ?  ?  ?PATIENT EDUCATION:  ?Education details: HEP and lymphedema education ?Person educated: Patient ?Education method: Explanation, Demonstration, and Handouts ?Education comprehension: verbalized understanding and returned demonstration ?  ?  ?HOME EXERCISE PROGRAM: ?           Reviewed previously given post op HEP. Issued closed chain HEP. ?  ?  ?ASSESSMENT: ?  ?CLINICAL IMPRESSION: ?First session of manual therapy working to decrease myofascial restrictions at lateral breast where cording visible and palpable. Also included STM and P/ROM. Encouraged her to cont with HEP stretches she was issued yesterday and evaluation. Pt reports overall noticing she is feeling slightly less tender since yesterday.  ?  ?Pt will benefit from skilled  therapeutic intervention to improve on the following deficits: Decreased knowledge of precautions, impaired UE functional use, pain, decreased ROM, postural dysfunction.  ?  ?PT treatment/interventions: ADL/Self care home management, Therapeutic exercises, Therapeutic activity, Neuromuscular re-education, Balance training, Gait training, Patient/Family education, Joint mobilization, Manual lymph drainage, scar mobilization, and Manual therapy ?  ?  ?  ?  ?  GOALS: ?Goals reviewed with patient? Yes ?  ?LONG TERM GOALS:  (STG=LTG) ?  ?GOALS Name Target Date Goal status  ?1 Pt will demonstrate she has regained full shoulder ROM and function post operatively compared to baselines.  ?Baseline: 06/30/2021 IN PROGRESS  ?2 Patient will increase right flexion and abduction to >/= 150 degrees for increased ease reaching overhead. 07/04/2021 INITIAL  ?3 Patient will improve her DASH score to be </= 5 for improved overall UE function. 07/04/2021 INITIAL  ?4 Patient will demonstrate her visible right breast cording has resolved (compared to photo taken today) 07/04/2021 INITIAL  ?5 Patient will report >/= 25% improvement in right lateral trunk pain to tolerate cleaning and home tasks. 07/04/2021 INITIAL  ?  ?  ?  ?PLAN: ?PT FREQUENCY/DURATION: 2x/week for 4 weeks ?  ?PLAN FOR NEXT SESSION: Cont myofascial release on right lateral breast; AAROM exercises for lateral trunk stretching. ? ? ? ?Otelia Limes, PTA ?06/07/2021, 10:05 AM ? ?  ? ?

## 2021-06-07 NOTE — Progress Notes (Deleted)
?OUTPATIENT PHYSICAL THERAPY TREATMENT NOTE ? ? ?Patient Name: Natasha Ortega ?MRN: [id number] ?DOB:1970-11-11, 51 y.o., female ?Today's Date: 06/07/2021 ? ?PCP: Louretta Shorten, MD ?REFERRING PROVIDER: Louretta Shorten, MD ? ?END OF SESSION:  ? ? ?Past Medical History:  ?Diagnosis Date  ? Cancer [facility])   ? breast  ? Headache   ? migraines  ? ?Past Surgical History:  ?Procedure Laterality Date  ? BREAST LUMPECTOMY WITH RADIOACTIVE SEED AND SENTINEL LYMPH NODE BIOPSY Right 05/11/2021  ? Procedure: RIGHT BREAST LUMPECTOMY WITH RADIOACTIVE SEED AND SENTINEL LYMPH NODE BIOPSY;  Surgeon: [name], MD;  Location: [place];  Service: General;  Laterality: Right;  ? ?Patient Active Problem List  ? Diagnosis Date Noted  ? Genetic testing 05/09/2021  ? Family history of breast cancer 05/05/2021  ? Malignant neoplasm of upper-outer quadrant of right breast in female, estrogen receptor negative ([place]) 04/28/2021  ? ? ?REFERRING DIAG: Rt breast cancer ? ?THERAPY DIAG:  Malignant neoplasm of upper-outer quadrant of right breast in female, estrogen receptor negative ([place]) ?  ? ?PERTINENT HISTORY: Patient was diagnosed on 04/28/2021 with right grade 2 invasive ductal carcinoma breast cancer. She underwent a right lumpectomy and sentinel node biopsy on 05/11/2021. She had 1 node with micromets and 2 negative nodes (3 total) removed. It is ER/PR negative and HER2 negative with a Ki67 of 40%. ? ?PRECAUTIONS: Recent Surgery, right UE Lymphedema risk ? ?SUBJECTIVE: *** ? ?PAIN:  ?Are you having pain? {OPRCPAIN:27236} ? ? ? ? ?PATIENT SURVEYS:  ?QUICK DASH: ?  Katina Dung - 06/06/21 0001   ?  ?  Open a tight or new jar No difficulty   ?  Do heavy household chores (wash walls, wash floors) Mild difficulty   ?  Carry a shopping bag or briefcase No difficulty   ?  Wash your back Moderate difficulty   ?  Use a knife to cut food No difficulty   ?  Recreational activities in which you take some force or impact through your arm, shoulder, or hand (golf,  hammering, tennis) Mild difficulty   ?  During the past week, to what extent has your arm, shoulder or hand problem interfered with your normal social activities with family, friends, neighbors, or groups? Slightly   ?  During the past week, to what extent has your arm, shoulder or hand problem limited your work or other regular daily activities Slightly   ?  Arm, shoulder, or hand pain. None   ?  Tingling (pins and needles) in your arm, shoulder, or hand None   ?  Difficulty Sleeping No difficulty   ?  DASH Score 13.64 %   ?  ?   ?  ?  ?   ?  ?  ?  ?OBSERVATIONS: ?           Incision is well healed in her left lateral breast. Glue still present on incision. She has lateral breast cording (see photo).  ?  ?POSTURE:  ?Forward head and rounded shoulders posture ?  ?UPPER EXTREMITY AROM/PROM: ?  ?A/PROM RIGHT  05/05/2021 ?  RIGHT 06/06/2021  ?Shoulder extension 49 45  ?Shoulder flexion 152 119  ?Shoulder abduction 164 130  ?Shoulder internal rotation 63 53  ?Shoulder external rotation 88 78  ?                        (Blank rows = not tested) ?  ?A/PROM LEFT  05/05/2021  ?Shoulder extension 60  ?  Shoulder flexion 149  ?Shoulder abduction 162  ?Shoulder internal rotation 61  ?Shoulder external rotation 89  ?                        (Blank rows = not tested) ?  ?  ?LYMPHEDEMA ASSESSMENTS:  ?  ?[place] RIGHT  05/05/2021 RIGHT 06/06/2021  ?10 cm proximal to olecranon process 34.5 33.4  ?Olecranon process 26.9 26.8  ?10 cm proximal to ulnar styloid process 23.7 23.5  ?Just proximal to ulnar styloid process 16 15.9  ?Across hand at thumb web space 19.5 18.9  ?At base of 2nd digit 6.2 6.2  ?(Blank rows = not tested) ?  ?[place] LEFT  05/05/2021 LEFT 06/06/2021  ?10 cm proximal to olecranon process 34.2 34  ?Olecranon process 26 26.1  ?10 cm proximal to ulnar styloid process 22.8 23  ?Just proximal to ulnar styloid process 16.3 16.1  ?Across hand at thumb web space 19.5 18.9  ?At base of 2nd digit 6.2 6.1  ?(Blank rows = not tested) ?   ?  ?  ?  ?            Today's Treatment: ? ? 06/07/21: ?  ?  ?  ?PATIENT EDUCATION:  ?Education details: HEP and lymphedema education ?Person educated: Patient ?Education method: Explanation, Demonstration, and Handouts ?Education comprehension: verbalized understanding and returned demonstration ?  ?  ?HOME EXERCISE PROGRAM: ?           Reviewed previously given post op HEP. Issued closed chain HEP. ?  ?  ?ASSESSMENT: ?  ?CLINICAL IMPRESSION: ?Patient is healing well s/p right lumpectomy and sentinel node biopsy (2 negative nodes and 1 nodes with micromets) on 05/11/2021. She has visible cording present in her right lateral breast. Her right lateral trunk soreness is contributing to her shoulder ROM deficits and she is very tender to palpation in that area. She will benefit from PT to regain shoulder ROM and function, address cording, and reduce pain and edema in the lateral trunk region. ?  ?Pt will benefit from skilled therapeutic intervention to improve on the following deficits: Decreased knowledge of precautions, impaired UE functional use, pain, decreased ROM, postural dysfunction.  ?  ?PT treatment/interventions: ADL/Self care home management, Therapeutic exercises, Therapeutic activity, Neuromuscular re-education, Balance training, Gait training, Patient/Family education, Joint mobilization, Manual lymph drainage, scar mobilization, and Manual therapy ?  ?  ?  ?  ?GOALS: ?Goals reviewed with patient? Yes ?  ?LONG TERM GOALS:  (STG=LTG) ?  ?GOALS Name Target Date Goal status  ?1 Pt will demonstrate she has regained full shoulder ROM and function post operatively compared to baselines.  ?Baseline: 06/30/2021 IN PROGRESS  ?2 Patient will increase right flexion and abduction to >/= 150 degrees for increased ease reaching overhead. 07/04/2021 INITIAL  ?3 Patient will improve her DASH score to be </= 5 for improved overall UE function. 07/04/2021 INITIAL  ?4 Patient will demonstrate her visible right breast cording  has resolved (compared to photo taken today) 07/04/2021 INITIAL  ?5 Patient will report >/= 25% improvement in right lateral trunk pain to tolerate cleaning and home tasks. 07/04/2021 INITIAL  ?  ?  ?  ?PLAN: ?PT FREQUENCY/DURATION: 2x/week for 4 weeks ?  ?PLAN FOR NEXT SESSION: Begin myofascial release on right lateral breast; AAROM exercises for lateral trunk stretching. ? ? ? ?Otelia Limes, PTA ?06/07/2021, 8:51 AM ? ?  ?

## 2021-06-07 NOTE — Progress Notes (Signed)

## 2021-06-08 ENCOUNTER — Other Ambulatory Visit: Payer: Self-pay | Admitting: Genetic Counselor

## 2021-06-08 DIAGNOSIS — Z1379 Encounter for other screening for genetic and chromosomal anomalies: Secondary | ICD-10-CM

## 2021-06-08 NOTE — Progress Notes (Signed)
? ?Patient Care Team: ?Louretta Shorten, MD as PCP - General (Obstetrics and Gynecology) ? ?DIAGNOSIS:  ?Encounter Diagnosis  ?Name Primary?  ? Malignant neoplasm of upper-outer quadrant of right breast in female, estrogen receptor negative ([place])   ? ? ?SUMMARY OF ONCOLOGIC HISTORY: ?Oncology History  ?Malignant neoplasm of upper-outer quadrant of right breast in female, estrogen receptor negative ([place])  ?04/21/2021 Initial Diagnosis  ? Screening mammogram detected right breast mass 6 mm by ultrasound, axilla negative, biopsy: Grade 2 IDC ER 0%, PR 0%, HER2 2+ by IHC ?  ?05/11/2021 Surgery  ? Right lumpectomy: Grade 2 IDC 0.9 cm, margins negative, angiolymphatic invasion present, 1 lymph node with micrometastases, 0/2 other lymph nodes negative, ER 0%, PR 0%, HER2 negative by FISH Ki-67 25% (final pathology is negative for HER2) ?  ?06/22/2021 -  Chemotherapy  ? Patient is on Treatment Plan : BREAST TC q21d  ? ?  ?  ? ? ?CHIEF COMPLIANT: Cycle 1 Taxotere Cytoxan  ? ?INTERVAL HISTORY: Natasha Ortega is a 51 y.o. female is here because of recent diagnosis of right-sided breast cancer. She presents to the clinic today for a follow-up. She state that she couldn't sleep last night.  She is here today to start her first cycle of adjuvant chemotherapy with Taxotere and Cytoxan. ? ?ALLERGIES:  is allergic to penicillins. ? ?MEDICATIONS:  ?Current Outpatient Medications  ?Medication Sig Dispense Refill  ? dexamethasone (DECADRON) 4 MG tablet Take 1 tablet (4 mg total) by mouth 2 (two) times daily. Take 1 tablet day before chemo and 1 tablet day after chemo with food 8 tablet 0  ? ibuprofen (ADVIL) 200 MG tablet Take by mouth.    ? lidocaine-prilocaine (EMLA) cream Apply to affected area once 30 g 3  ? Multiple Vitamins-Minerals (MULTIVITAMIN WITH MINERALS) tablet Take 1 tablet by mouth daily.    ? ondansetron (ZOFRAN) 8 MG tablet Take 1 tablet (8 mg total) by mouth 2 (two) times daily as needed for refractory nausea / vomiting.  Start on day 3 after chemo. 30 tablet 1  ? prochlorperazine (COMPAZINE) 10 MG tablet Take 1 tablet (10 mg total) by mouth every 6 (six) hours as needed (Nausea or vomiting). 30 tablet 1  ? traMADol (ULTRAM) 50 MG tablet Take 1 tablet (50 mg total) by mouth every 6 (six) hours as needed for moderate pain. 25 tablet 0  ? ?No current facility-administered medications for this visit.  ? ? ?PHYSICAL EXAMINATION: ?ECOG PERFORMANCE STATUS: 1 - Symptomatic but completely ambulatory ? ?Vitals:  ? 06/22/21 1055  ?BP: (!) 134/91  ?Pulse: 74  ?Resp: 18  ?Temp: 97.9 ?F (36.6 ?C)  ?SpO2: 99%  ? ?Filed Weights  ? 06/22/21 1055  ?Weight: 147 lb 9.6 oz (67 kg)  ? ?  ? ?LABORATORY DATA:  ?I have reviewed the data as listed ?   ? View : No data to display.  ?  ?  ?  ? ? ?Lab Results  ?Component Value Date  ? WBC 10.6 (H) 06/22/2021  ? HGB 13.4 06/22/2021  ? HCT 39.5 06/22/2021  ? MCV 88.4 06/22/2021  ? PLT 333 06/22/2021  ? NEUTROABS 6.9 06/22/2021  ? ? ?ASSESSMENT & PLAN:  ?Malignant neoplasm of upper-outer quadrant of right breast in female, estrogen receptor negative ([place]) ?04/21/2021:Screening mammogram detected right breast mass 6 mm by ultrasound, axilla negative, biopsy: Grade 2 IDC ER 0%, PR 0%, HER2 2+ by IHC ?05/11/2021:Right lumpectomy: Grade 2 IDC 0.9 cm, margins negative, angiolymphatic invasion  present, 1 lymph node with micrometastases, 0/2 other lymph nodes negative, ER 0%, PR 0%, HER2 2+ by IHC and FISH negative with a ratio 1.33 Ki-67 25% ?  ?Treatment plan: ?1.  Adjuvant chemotherapy with Taxotere Cytoxan every 3 weeks x4 ?2.  Adjuvant radiation ?----------------------------------------------------------------------------------------------------------------------------- ?Current treatment: Cycle 1 Taxotere Cytoxan ?Labs reviewed, chemo consent obtained, chemo education completed, antiemetics were reviewed ?Return to clinic in 1 week for toxicity check ? ? ? ?No orders of the defined types were placed in this  encounter. ? ?The patient has a good understanding of the overall plan. she agrees with it. she will call with any problems that may develop before the next visit here. ?Total time spent: 30 mins including face to face time and time spent for planning, charting and co-ordination of care ? ? Harriette Ohara, MD ?06/22/21 ? ? ? I Gardiner Coins am scribing for Dr. Lindi Adie ? ?I have reviewed the above documentation for accuracy and completeness, and I agree with the above. ?  ?

## 2021-06-08 NOTE — H&P (Signed)
?  PROVIDER: Beverlee Nims, MD ? ?MRN: [id number] ?DOB: 13-Apr-1970 ?DATE OF ENCOUNTER: 06/07/2021 ?Interval History:  ? ?She is here for her postoperative visit status post radioactive seed guided lumpectomy and sentinel node biopsy on the right side. She has already seen medical oncology postoperatively and chemotherapy has  been recommended so she is here today also discussed Port-A-Cath insertion. She reports has been doing well except for some mild discomfort along her rib and axilla and back ? ?Her medical history is unchanged from her previous surgery 2 weeks ago ?Review of Systems: ?A complete review of systems was obtained from the patient. I have reviewed this information and discussed as appropriate with the patient. See HPI as well for other ROS. ? ?ROS  ? ?Medical History: ?Past Medical History:  ?Diagnosis Date  ? History of cancer  ? ?There is no problem list on file for this patient. ? ?History reviewed. No pertinent surgical history.  ? ?Allergies  ?Allergen Reactions  ? Penicillins Rash  ? ?No current outpatient medications on file prior to visit.  ? ?No current facility-administered medications on file prior to visit ? ? ? ?Physical Exam  ? ?She appears well on exam. The incision is well-healing in the axilla. There is some cording of the breast but no evidence of infection. ? ?Assessment and Plan:  ? ? ?Right breast cancer ? ?At this point, she is already scheduled for a Port-A-Cath insertion 2 days from now. I again explained the surgical procedure in detail. We discussed the risk which includes but is not limited to bleeding, infection, injury to surrounding structures, pneumothorax, the need for further procedures, port failure, cardiopulmonary issues, etc. She understands and agrees to proceed with surgery  ?

## 2021-06-09 ENCOUNTER — Other Ambulatory Visit: Payer: Self-pay

## 2021-06-09 ENCOUNTER — Telehealth: Payer: Self-pay | Admitting: *Deleted

## 2021-06-09 ENCOUNTER — Ambulatory Visit (HOSPITAL_COMMUNITY): Payer: BC Managed Care – PPO

## 2021-06-09 ENCOUNTER — Encounter (HOSPITAL_BASED_OUTPATIENT_CLINIC_OR_DEPARTMENT_OTHER)
Admission: RE | Disposition: A | Discharge status: Home / Self Care | Payer: Self-pay | Source: Home / Self Care | Attending: Surgery

## 2021-06-09 ENCOUNTER — Ambulatory Visit (HOSPITAL_BASED_OUTPATIENT_CLINIC_OR_DEPARTMENT_OTHER): Payer: BC Managed Care – PPO | Admitting: Certified Registered"

## 2021-06-09 ENCOUNTER — Ambulatory Visit (HOSPITAL_BASED_OUTPATIENT_CLINIC_OR_DEPARTMENT_OTHER)
Admission: RE | Admit: 2021-06-09 | Discharge: 2021-06-09 | Disposition: A | Discharge status: Home / Self Care | Payer: BC Managed Care – PPO | Attending: Surgery | Admitting: Surgery

## 2021-06-09 ENCOUNTER — Encounter (HOSPITAL_BASED_OUTPATIENT_CLINIC_OR_DEPARTMENT_OTHER): Payer: Self-pay | Admitting: Surgery

## 2021-06-09 DIAGNOSIS — C50911 Malignant neoplasm of unspecified site of right female breast: Secondary | ICD-10-CM | POA: Insufficient documentation

## 2021-06-09 HISTORY — PX: PORTACATH PLACEMENT: SHX2246

## 2021-06-09 LAB — POCT PREGNANCY, URINE: Preg Test, Ur: NEGATIVE

## 2021-06-09 SURGERY — INSERTION, TUNNELED CENTRAL VENOUS DEVICE, WITH PORT
Anesthesia: General | Site: Chest

## 2021-06-09 MED ORDER — FENTANYL CITRATE (PF) 100 MCG/2ML IJ SOLN
INTRAMUSCULAR | Status: AC
  Filled 2021-06-09: qty 2

## 2021-06-09 MED ORDER — BUPIVACAINE-EPINEPHRINE (PF) 0.5% -1:200000 IJ SOLN
INTRAMUSCULAR | Status: AC
  Filled 2021-06-09: qty 30

## 2021-06-09 MED ORDER — ONDANSETRON HCL 4 MG/2ML IJ SOLN
INTRAMUSCULAR | Status: DC | PRN
  Administered 2021-06-09: 4 mg via INTRAVENOUS

## 2021-06-09 MED ORDER — PROPOFOL 10 MG/ML IV BOLUS
INTRAVENOUS | Status: AC
  Filled 2021-06-09: qty 20

## 2021-06-09 MED ORDER — PHENYLEPHRINE HCL (PRESSORS) 10 MG/ML IV SOLN
INTRAVENOUS | Status: DC | PRN
  Administered 2021-06-09: 160 ug via INTRAVENOUS
  Administered 2021-06-09: 80 ug via INTRAVENOUS
  Administered 2021-06-09: 160 ug via INTRAVENOUS
  Administered 2021-06-09: 80 ug via INTRAVENOUS

## 2021-06-09 MED ORDER — CHLORHEXIDINE GLUCONATE CLOTH 2 % EX PADS: 6.0000 | MEDICATED_PAD | Freq: Once | CUTANEOUS | Status: DC

## 2021-06-09 MED ORDER — HEPARIN SOD (PORK) LOCK FLUSH 100 UNIT/ML IV SOLN
INTRAVENOUS | Status: AC
  Filled 2021-06-09: qty 5

## 2021-06-09 MED ORDER — LIDOCAINE HCL (CARDIAC) PF 100 MG/5ML IV SOSY
PREFILLED_SYRINGE | INTRAVENOUS | Status: DC | PRN
  Administered 2021-06-09: 60 mg via INTRAVENOUS

## 2021-06-09 MED ORDER — DEXAMETHASONE SODIUM PHOSPHATE 10 MG/ML IJ SOLN
INTRAMUSCULAR | Status: DC | PRN
Start: 2021-06-09 — End: 2021-06-09
  Administered 2021-06-09: 5 mg via INTRAVENOUS

## 2021-06-09 MED ORDER — MIDAZOLAM HCL 2 MG/2ML IJ SOLN
INTRAMUSCULAR | Status: DC | PRN
  Administered 2021-06-09: 2 mg via INTRAVENOUS

## 2021-06-09 MED ORDER — LIDOCAINE 2% (20 MG/ML) 5 ML SYRINGE
INTRAMUSCULAR | Status: AC
  Filled 2021-06-09: qty 5

## 2021-06-09 MED ORDER — FENTANYL CITRATE (PF) 100 MCG/2ML IJ SOLN
INTRAMUSCULAR | Status: DC | PRN
Start: 2021-06-09 — End: 2021-06-09
  Administered 2021-06-09: 100 ug via INTRAVENOUS

## 2021-06-09 MED ORDER — EPHEDRINE SULFATE (PRESSORS) 50 MG/ML IJ SOLN
INTRAMUSCULAR | Status: DC | PRN
  Administered 2021-06-09: 15 mg via INTRAVENOUS

## 2021-06-09 MED ORDER — ACETAMINOPHEN 500 MG PO TABS
ORAL_TABLET | ORAL | Status: AC
  Filled 2021-06-09: qty 2

## 2021-06-09 MED ORDER — PROPOFOL 10 MG/ML IV BOLUS
INTRAVENOUS | Status: DC | PRN
  Administered 2021-06-09: 200 mg via INTRAVENOUS

## 2021-06-09 MED ORDER — ONDANSETRON HCL 4 MG/2ML IJ SOLN
INTRAMUSCULAR | Status: AC
  Filled 2021-06-09: qty 2

## 2021-06-09 MED ORDER — HEPARIN (PORCINE) IN NACL 1000-0.9 UT/500ML-% IV SOLN
INTRAVENOUS | Status: AC
  Filled 2021-06-09: qty 500

## 2021-06-09 MED ORDER — LACTATED RINGERS IV SOLN: INTRAVENOUS | Status: DC | PRN

## 2021-06-09 MED ORDER — HYDROMORPHONE HCL 1 MG/ML IJ SOLN: 0.2500 mg | INTRAMUSCULAR | Status: DC | PRN

## 2021-06-09 MED ORDER — ACETAMINOPHEN 500 MG PO TABS
1000.0000 mg | ORAL_TABLET | ORAL | Status: AC
  Administered 2021-06-09: 1000 mg via ORAL

## 2021-06-09 MED ORDER — MIDAZOLAM HCL 2 MG/2ML IJ SOLN
INTRAMUSCULAR | Status: AC
  Filled 2021-06-09: qty 2

## 2021-06-09 MED ORDER — DEXAMETHASONE SODIUM PHOSPHATE 10 MG/ML IJ SOLN
INTRAMUSCULAR | Status: AC
  Filled 2021-06-09: qty 1

## 2021-06-09 MED ORDER — BUPIVACAINE-EPINEPHRINE 0.5% -1:200000 IJ SOLN
INTRAMUSCULAR | Status: DC | PRN
  Administered 2021-06-09: 10 mL

## 2021-06-09 MED ORDER — LACTATED RINGERS IV SOLN: INTRAVENOUS | Status: DC

## 2021-06-09 MED ORDER — HEPARIN SOD (PORK) LOCK FLUSH 100 UNIT/ML IV SOLN
INTRAVENOUS | Status: DC | PRN
  Administered 2021-06-09: 500 [IU] via INTRAVENOUS

## 2021-06-09 MED ORDER — CIPROFLOXACIN IN D5W 400 MG/200ML IV SOLN
400.0000 mg | INTRAVENOUS | Status: AC
  Administered 2021-06-09: 400 mg via INTRAVENOUS

## 2021-06-09 MED ORDER — CIPROFLOXACIN IN D5W 400 MG/200ML IV SOLN
INTRAVENOUS | Status: AC
  Filled 2021-06-09: qty 200

## 2021-06-09 MED ORDER — HEPARIN (PORCINE) IN NACL 2-0.9 UNITS/ML
INTRAMUSCULAR | Status: AC | PRN
  Administered 2021-06-09: 500 mL via INTRAVENOUS

## 2021-06-09 SURGICAL SUPPLY — 40 items
ADH SKN CLS APL DERMABOND .7 (GAUZE/BANDAGES/DRESSINGS) ×2
APL PRP STRL LF DISP 70% ISPRP (MISCELLANEOUS) ×1
BAG DECANTER FOR FLEXI CONT (MISCELLANEOUS) ×2 IMPLANT
BLADE SURG 15 STRL LF DISP TIS (BLADE) ×1 IMPLANT
BLADE SURG 15 STRL SS (BLADE) ×2
CANISTER SUCT 1200ML W/VALVE (MISCELLANEOUS) IMPLANT
CHLORAPREP W/TINT 26 (MISCELLANEOUS) ×2 IMPLANT
COVER BACK TABLE 60X90IN (DRAPES) ×2 IMPLANT
COVER MAYO STAND STRL (DRAPES) ×2 IMPLANT
DERMABOND ADVANCED (GAUZE/BANDAGES/DRESSINGS) ×2
DERMABOND ADVANCED .7 DNX12 (GAUZE/BANDAGES/DRESSINGS) ×2 IMPLANT
DRAPE C-ARM 42X72 X-RAY (DRAPES) ×2 IMPLANT
DRAPE LAPAROSCOPIC ABDOMINAL (DRAPES) ×2 IMPLANT
DRAPE UTILITY XL STRL (DRAPES) ×2 IMPLANT
ELECT REM PT RETURN 9FT ADLT (ELECTROSURGICAL) ×2
ELECTRODE REM PT RTRN 9FT ADLT (ELECTROSURGICAL) ×1 IMPLANT
GAUZE 4X4 16PLY ~~LOC~~+RFID DBL (SPONGE) ×2 IMPLANT
GLOVE SURG SIGNA 7.5 PF LTX (GLOVE) ×2 IMPLANT
GOWN STRL REUS W/ TWL LRG LVL3 (GOWN DISPOSABLE) ×1 IMPLANT
GOWN STRL REUS W/ TWL XL LVL3 (GOWN DISPOSABLE) ×1 IMPLANT
GOWN STRL REUS W/TWL LRG LVL3 (GOWN DISPOSABLE) ×2
GOWN STRL REUS W/TWL XL LVL3 (GOWN DISPOSABLE) ×2
IV KIT MINILOC 20X1 SAFETY (NEEDLE) IMPLANT
KIT PORT POWER 8FR ISP CVUE (Port) ×1 IMPLANT
NDL HYPO 25X1 1.5 SAFETY (NEEDLE) ×1 IMPLANT
NEEDLE HYPO 25X1 1.5 SAFETY (NEEDLE) ×2 IMPLANT
PACK BASIN DAY SURGERY FS (CUSTOM PROCEDURE TRAY) ×2 IMPLANT
PENCIL SMOKE EVACUATOR (MISCELLANEOUS) ×2 IMPLANT
SLEEVE SCD COMPRESS KNEE MED (STOCKING) ×2 IMPLANT
SPIKE FLUID TRANSFER (MISCELLANEOUS) IMPLANT
SUT MNCRL AB 4-0 PS2 18 (SUTURE) ×2 IMPLANT
SUT PROLENE 2 0 SH DA (SUTURE) ×2 IMPLANT
SUT SILK 2 0 TIES 17X18 (SUTURE)
SUT SILK 2-0 18XBRD TIE BLK (SUTURE) IMPLANT
SUT VIC AB 3-0 SH 27 (SUTURE) ×2
SUT VIC AB 3-0 SH 27X BRD (SUTURE) ×1 IMPLANT
SYR CONTROL 10ML LL (SYRINGE) ×2 IMPLANT
TOWEL GREEN STERILE FF (TOWEL DISPOSABLE) ×2 IMPLANT
TUBE CONNECTING 20X1/4 (TUBING) IMPLANT
YANKAUER SUCT BULB TIP NO VENT (SUCTIONS) IMPLANT

## 2021-06-09 NOTE — Telephone Encounter (Signed)
Confirmed receipt of FMLA with patient call to confirm receipt of leave paperwork.  "It is due this week."  ? ?Completed form to provider for review and signature.  ? ?Successfully returned leave paperwork via fax to [phone].  Message left notifying patient form ready for pick-up on next appointment. ? ?Return call received.  Natasha Ortega "I will be in tomorrow to meet with research so I am able to pick this up tomorrow.  Do you have confirmation?" ? ?Advised confirmation included with form.  "I am doing well since port-a-cath laced today, going to lay low for the rest of the day."   ?Currently no further questions or needs.       ?

## 2021-06-09 NOTE — Transfer of Care (Signed)
Immediate Anesthesia Transfer of Care Note ? ?Patient: Natasha Ortega ? ?Procedure(s) Performed: INSERTION PORT-A-CATH (Chest) ? ?Patient Location: PACU ? ?Anesthesia Type:General ? ?Level of Consciousness: awake, alert  and oriented ? ?Airway & Oxygen Therapy: Patient Spontanous Breathing and Patient connected to face mask oxygen ? ?Post-op Assessment: Report given to RN and Post -op Vital signs reviewed and stable ? ?Post vital signs: Reviewed and stable ? ?Last Vitals:  ?Vitals Value Taken Time  ?BP    ?Temp    ?Pulse 104 06/09/21 1300  ?Resp 14 06/09/21 1300  ?SpO2 99 % 06/09/21 1300  ?Vitals shown include unvalidated device data. ? ?Last Pain:  ?Vitals:  ? 06/09/21 1051  ?TempSrc: Oral  ?PainSc: 0-No pain  ?   ? ?Patients Stated Pain Goal: 3 (06/09/21 1051) ? ?Complications: No notable events documented. ?

## 2021-06-09 NOTE — Discharge Instructions (Addendum)
Ok to shower starting tomorrow ? ?Ice pack,tylenol, and ibuprofen also for pain ? ?No vigorous activity for one week ? ?May take Tylenol after 5pm, if needed.  ? ? ?Post Anesthesia Home Care Instructions ? ?Activity: ?Get plenty of rest for the remainder of the day. A responsible individual must stay with you for 24 hours following the procedure.  ?For the next 24 hours, DO NOT: ?-Drive a car ?-Paediatric nurse ?-Drink alcoholic beverages ?-Take any medication unless instructed by your physician ?-Make any legal decisions or sign important papers. ? ?Meals: ?Start with liquid foods such as gelatin or soup. Progress to regular foods as tolerated. Avoid greasy, spicy, heavy foods. If nausea and/or vomiting occur, drink only clear liquids until the nausea and/or vomiting subsides. Call your physician if vomiting continues. ? ?Special Instructions/Symptoms: ?Your throat may feel dry or sore from the anesthesia or the breathing tube placed in your throat during surgery. If this causes discomfort, gargle with warm salt water. The discomfort should disappear within 24 hours. ? ?If you had a scopolamine patch placed behind your ear for the management of post- operative nausea and/or vomiting: ? ?1. The medication in the patch is effective for 72 hours, after which it should be removed.  Wrap patch in a tissue and discard in the trash. Wash hands thoroughly with soap and water. ?2. You may remove the patch earlier than 72 hours if you experience unpleasant side effects which may include dry mouth, dizziness or visual disturbances. ?3. Avoid touching the patch. Wash your hands with soap and water after contact with the patch. ?    ?

## 2021-06-09 NOTE — Op Note (Signed)
INSERTION PORT-A-CATH  Procedure Note ? ?Natasha Ortega ?06/09/2021 ? ? ?Pre-op Diagnosis: BREAST CANCER, NEED FOR CHEMOTHERAPY ?    ?Post-op Diagnosis: same ? ?Procedure(s): ?INSERTION PORT-A-CATH LEFT SUBCLAVIAN VEIN (8 FR CLEARVIEW) ? ?Surgeon(s): ?[name], MD ? ?Assist: Cameron Sprang, MD [facility] Resident ?Anesthesia: General ? ?Staff:  ?Circulator: Jolene Schimke, RN ?Relief Circulator: Patric Dykes, RN ?Scrub Person: Jobe Igo I ? ?Estimated Blood Loss: Minimal ?              ?Indications: This is a patient with a recently diagnosed right breast cancer and need for intravenous chemotherapy.  The decision has been made to proceed with Port-A-Cath insertion ? ?Procedure: The patient was brought to the operating room and identifies the correct patient.  She is placed upon the operating table and general anesthesia was induced.  Her left chest and neck were then prepped and draped in usual sterile fashion.  I anesthetized skin of the chest on the left clavicle with Marcaine.  I then used the introducer needle to cannulate the left subclavian vein.  The vein was easily cannulated with the first introduction of the needle but the wire would not pass through.  I adjusted the needle several times because still not get the wire through so I had to remove the needle and then recannulate the vein several times until I was finally able to get access again.  The wire then passed through the needle and into the central venous system under direct fluoroscopy.  I removed the needle.  I anesthetized the skin at the wire site with Marcaine.  I made incision with a scalpel and then created a pocket for the port.  An 8 French [place] port was brought to the field and flushed appropriately.  I connected the catheter to the port and cut an appropriate length.  The venous dilator introducer sheath went easily over the wire and into the central venous system.  The dilator and wire were then removed.  I placed the  port into the pocket and then fed the catheter down the peel-away sheath.  I then accessed the port and good flush and return were demonstrated.  Fluoroscopy was again performed showing that the end of the catheter was in the vena cava.  I again accessed the port and instilled concentrated heparin solution into the port.  The port was then sewn to the chest wall with 2 separate 3-0 Prolene sutures.  The subcutaneous tissue was then closed interrupted 3-0 Vicryl sutures and the skin was closed with running 4-0 Monocryl.  Dermabond was then applied.  The patient tolerated the procedure well.  All the counts were correct at the end of the procedure.  The patient was then extubated in the operating room and taken in stable condition to the recovery room. ?        ? ?[name]  ? ?Date: 06/09/2021  Time: 12:46 PM ? ? ? ?

## 2021-06-09 NOTE — Anesthesia Preprocedure Evaluation (Addendum)
Anesthesia Evaluation  ?Patient identified by MRN, date of birth, ID band ?Patient awake ? ? ? ?Reviewed: ?Allergy & Precautions, H&P , NPO status , Patient's Chart, lab work & pertinent test results ? ?Airway ?Mallampati: II ? ?TM Distance: >3 FB ?Neck ROM: Full ? ? ? Dental ?no notable dental hx. ?(+) Teeth Intact, Dental Advisory Given ?  ?Pulmonary ?neg pulmonary ROS,  ?  ?Pulmonary exam normal ?breath sounds clear to auscultation ? ? ? ? ? ? Cardiovascular ?negative cardio ROS ? ? ?Rhythm:Regular Rate:Normal ? ? ?  ?Neuro/Psych ? Headaches, negative neurological ROS ? negative psych ROS  ? GI/Hepatic ?negative GI ROS, Neg liver ROS,   ?Endo/Other  ?negative endocrine ROS ? Renal/GU ?negative Renal ROS  ?negative genitourinary ?  ?Musculoskeletal ? ? Abdominal ?  ?Peds ? Hematology ?negative hematology ROS ?(+)   ?Anesthesia Other Findings ? ? Reproductive/Obstetrics ?negative OB ROS ? ?  ? ? ? ? ? ? ? ? ? ? ? ? ? ?  ?  ? ? ? ? ? ? ? ?Anesthesia Physical ?Anesthesia Plan ? ?ASA: 2 ? ?Anesthesia Plan: General  ? ?Post-op Pain Management: Tylenol PO (pre-op)* and Toradol IV (intra-op)*  ? ?Induction: Intravenous ? ?PONV Risk Score and Plan: 4 or greater and Ondansetron, Dexamethasone and Midazolam ? ?Airway Management Planned: LMA ? ?Additional Equipment:  ? ?Intra-op Plan:  ? ?Post-operative Plan: Extubation in OR ? ?Informed Consent: I have reviewed the patients History and Physical, chart, labs and discussed the procedure including the risks, benefits and alternatives for the proposed anesthesia with the patient or authorized representative who has indicated his/her understanding and acceptance.  ? ? ? ?Dental advisory given ? ?Plan Discussed with: CRNA ? ?Anesthesia Plan Comments:   ? ? ? ? ? ? ?Anesthesia Quick Evaluation ? ?

## 2021-06-09 NOTE — Interval H&P Note (Signed)
History and Physical Interval Note:no change in H and P ? ?06/09/2021 ?10:44 AM ? ?Natasha Ortega  has presented today for surgery, with the diagnosis of BREAST CANCER, NEED FOR CHEMOTHERAPY.  The various methods of treatment have been discussed with the patient and family. After consideration of risks, benefits and other options for treatment, the patient has consented to  Procedure(s): ?INSERTION PORT-A-CATH (N/A) as a surgical intervention.  The patient's history has been reviewed, patient examined, no change in status, stable for surgery.  I have reviewed the patient's chart and labs.  Questions were answered to the patient's satisfaction.   ? ? ?[name] ? ? ?

## 2021-06-09 NOTE — Anesthesia Postprocedure Evaluation (Signed)
Anesthesia Post Note ? ?Patient: Natasha Ortega ? ?Procedure(s) Performed: INSERTION PORT-A-CATH (Chest) ? ?  ? ?Patient location during evaluation: PACU ?Anesthesia Type: General ?Level of consciousness: awake and alert ?Pain management: pain level controlled ?Vital Signs Assessment: post-procedure vital signs reviewed and stable ?Respiratory status: spontaneous breathing, nonlabored ventilation and respiratory function stable ?Cardiovascular status: blood pressure returned to baseline and stable ?Postop Assessment: no apparent nausea or vomiting ?Anesthetic complications: no ? ? ?No notable events documented. ? ?Last Vitals:  ?Vitals:  ? 06/09/21 1330 06/09/21 1359  ?BP: 118/81 121/90  ?Pulse: 76 69  ?Resp: 11 16  ?Temp:  (!) 36.4 ?C  ?SpO2: 98% 100%  ?  ?Last Pain:  ?Vitals:  ? 06/09/21 1349  ?TempSrc:   ?PainSc: 0-No pain  ? ? ?  ?  ?  ?  ?  ?  ? ?Torben Soloway,W. EDMOND ? ? ? ? ?

## 2021-06-10 ENCOUNTER — Inpatient Hospital Stay: Payer: BC Managed Care – PPO

## 2021-06-10 ENCOUNTER — Ambulatory Visit: Payer: BC Managed Care – PPO | Admitting: Hematology and Oncology

## 2021-06-10 ENCOUNTER — Encounter: Payer: Self-pay | Admitting: Radiology

## 2021-06-10 ENCOUNTER — Other Ambulatory Visit: Payer: Self-pay

## 2021-06-10 DIAGNOSIS — C50411 Malignant neoplasm of upper-outer quadrant of right female breast: Secondary | ICD-10-CM

## 2021-06-10 NOTE — Research (Signed)
Trial Name:  [id number] - TREATMENT OF REFRACTORY NAUSEA ? ? ?Patient Natasha Ortega was identified by Dr Lindi Adie as a potential candidate for the above listed study.  This Clinical Research Nurse met with Natasha Ortega, Natasha Ortega on 06/10/21 in a manner and location that ensures patient privacy to discuss participation in the above listed research study.  Patient is Unaccompanied.  Patient was previously provided with informed consent documents.  Patient has not yet read the informed consent documents and so documents were reviewed page by page today. ? ?As outlined in the informed consent form, this Nurse and Natasha Ortega discussed the purpose of the research study, the investigational nature of the study, study procedures and requirements for study participation, potential risks and benefits of study participation, as well as alternatives to participation.  This study is blinded or double-blinded. The patient understands participation is voluntary and they may withdraw from study participation at any time.  Each study arm was reviewed, and randomization discussed.  Potential side effects were reviewed with patient as outlined in the consent form, and patient made aware there may be side effects not yet known. The chance of receiving placebo was discussed. Patient understands enrollment is pending full eligibility review.  ? ?Confidentiality and how the patient's information will be used as part of study participation were discussed.  Patient was informed there is not reimbursement provided for their time and effort spent on trial participation.  The patient is encouraged to discuss research study participation with their insurance provider to determine what costs they may incur as part of study participation, including research related injury.   ? ?All questions were answered to patient's satisfaction.  The informed consent with embedded HIPAA language was reviewed page by page.  The patient's mental and  emotional status is appropriate to provide informed consent, and the patient verbalizes an understanding of study participation.  Patient has agreed to participate in the above listed research study and has voluntarily signed the informed consent version date 03/23/21 and separate HIPAA Authorization, version dated 08/31/20  on 06/10/21 at 1:50PM.  The patient was provided with a copy of the signed informed consent form and separate HIPAA Authorization for their reference.  No study specific procedures were obtained prior to the signing of the informed consent document.  Approximately 30 minutes were spent with the patient reviewing the informed consent documents.  Patient has chosen to not complete a Release of Information form at this time. ? ?Marjie Skiff Chayse Zatarain, RN, BSN, CHPN ?[facility] ?Clinical Research Nurse ?[place] ?Direct Dial [phone]  Pager [phone] ?06/10/2021 3:02 PM ? ?

## 2021-06-10 NOTE — Research (Signed)
[id number] - TREATMENT OF REFRACTORY NAUSEA  ? ?06/10/2021 ? ?SECOND ELIGIBILITY:  ?This Coordinator has reviewed this patient's inclusion and exclusion criteria and confirmed Natasha Ortega is eligible for study participation.  Patient will continue with enrollment. ? ?Menopausal status (women only): Natasha Ortega is peri- menopausal and had a negative pregnancy test on 06/09/2021.  ? ?Eligibility confirmed by Larina Bras, RN, Clinical Research Nurse, who also agrees that patient should proceed with enrollment.  ?

## 2021-06-10 NOTE — Research (Addendum)
[id number] - TREATMENT OF REFRACTORY NAUSEA   This Nurse has reviewed this patient's inclusion and exclusion criteria and confirmed Natasha Ortega is eligible for study participation.  Patient will continue with enrollment.  Eligibility confirmed by treating investigator, who also agrees that patient should proceed with enrollment.  Patient had a negative UPT yesterday, and we will repeat the morning of chemo, as it will be outside the 10 day window. Her last period was the summer of 2022.  Confirmed with Dr Lindi Adie that patient will get dexamethasone, ondansetron, and fosaprepitant for nausea prevention.   Obtained updates from the patient and the investigator during the consent visit and confirmed the following:  Is the patient chemo therapy naive? Yes Is the patient able to read, write, and understand English, and is she able to give informed consent? Yes ECOG per investigator: 1 Is the patient over 18 years old? (If so, please get approval from an oncologist or their designee to participate in the study) No Is the patient of child-bearing potential? Yes If the patient is of child-bearing potential, has she had a negative pregnancy test within 10 days prior to chemo initiation? See above If the patient is not of child-bearing potential, why is she not of child-bearing potential? N/A Does the patient have clinical evidence of current or impending bowel obstruction? No Does the patient have a known history of central nervous system disease (e.g. brain mets or seizure disorder)? No Does the patient have dementia? No Does the patient have uncontrolled diabetes mellitus or hyperglycemia? No Does the patient have severe hepatic impairment, severe renal impairment, or ESRD, as determined by treating physician? No Has the patient had long-term (>5 days within the past 30 days) treatment with an antipsychotic, a phenothiazine, or a butyrophenone, or are there plans to start them? No Is the  patient taking an anticholinergic? No Is the patient taking amifostine (Ethiofos)? No Does the patient have a known hypersensitivity to olanzapine or phenothiazines? No  Natasha Karges G. Maxyne Derocher, RN, BSN, [facility] Clinical Research Nurse [facility] Direct Dial [phone]  Pager [phone] 06/10/2021 3:07 PM    LATE ENTRY: On day of consent, pt confirmed understand of need for adequate contraception during chemo & study involvement and will use appropriate contraception. No known arrhythmias, no uncontrolled HF, no hx MI.  Natasha Skiff Anh Bigos, RN, BSN, [facility] Clinical Research Nurse [place] [phone]  Pager [phone] 07/20/2021 1:07 PM

## 2021-06-13 ENCOUNTER — Encounter (HOSPITAL_BASED_OUTPATIENT_CLINIC_OR_DEPARTMENT_OTHER): Payer: Self-pay | Admitting: Surgery

## 2021-06-15 NOTE — Progress Notes (Signed)
Pharmacist Chemotherapy Monitoring - Initial Assessment   ? ?Anticipated start date: 06/22/21  ? ?The following has been reviewed per standard work regarding the patient's treatment regimen: ?The patient's diagnosis, treatment plan and drug doses, and organ/hematologic function ?Lab orders and baseline tests specific to treatment regimen  ?The treatment plan start date, drug sequencing, and pre-medications ?Prior authorization status  ?Patient's documented medication list, including drug-drug interaction screen and prescriptions for anti-emetics and supportive care specific to the treatment regimen ?The drug concentrations, fluid compatibility, administration routes, and timing of the medications to be used ?The patient's access for treatment and lifetime cumulative dose history, if applicable  ?The patient's medication allergies and previous infusion related reactions, if applicable  ? ?Changes made to treatment plan:  ?treatment plan date ? ?Follow up needed:  ?PA initiation pending. ?Need baseline CMET & CBC ? ? ?Natasha Ortega, [place], ?06/15/2021  9:37 AM ? ?

## 2021-06-15 NOTE — Progress Notes (Signed)
? ?Patient Care Team: ?Louretta Shorten, MD as PCP - General (Obstetrics and Gynecology) ? ?DIAGNOSIS:  ?Encounter Diagnosis  ?Name Primary?  ? Malignant neoplasm of upper-outer quadrant of right breast in female, estrogen receptor negative ([place])   ? ? ?SUMMARY OF ONCOLOGIC HISTORY: ?Oncology History  ?Malignant neoplasm of upper-outer quadrant of right breast in female, estrogen receptor negative ([place])  ?04/21/2021 Initial Diagnosis  ? Screening mammogram detected right breast mass 6 mm by ultrasound, axilla negative, biopsy: Grade 2 IDC ER 0%, PR 0%, HER2 2+ by IHC ?  ?05/11/2021 Surgery  ? Right lumpectomy: Grade 2 IDC 0.9 cm, margins negative, angiolymphatic invasion present, 1 lymph node with micrometastases, 0/2 other lymph nodes negative, ER 0%, PR 0%, HER2 negative by FISH Ki-67 25% (final pathology is negative for HER2) ?  ?06/22/2021 -  Chemotherapy  ? Patient is on Treatment Plan : BREAST TC q21d  ? ?  ?  ? ? ?CHIEF COMPLIANT: Cycle 1 day 8 docetaxel Cyclophosphamide  ? ?INTERVAL HISTORY: Natasha Ortega is a 51 y.o. female is here because of recent diagnosis of right-sided breast cancer on Docetaxel  ?    Cyclophosphamide She presents to the clinic today for a follow-up . She state she did well. She slept well and been active as far as cleaning. Saturday she felt heavy and had some fatigue. She state that she had stomach issues from eating a lot. She state that she had some diarrhea. She state that she had some headaches. She state last night she had a fever at 100.2. State she had some tenderness behind left jaw bone. She denies mouth sores. ? ?ALLERGIES:  is allergic to penicillins. ? ?MEDICATIONS:  ?Current Outpatient Medications  ?Medication Sig Dispense Refill  ? fluconazole (DIFLUCAN) 100 MG tablet Take 1 tablet (100 mg total) by mouth daily. 10 tablet 0  ? dexamethasone (DECADRON) 4 MG tablet Take 1 tablet (4 mg total) by mouth 2 (two) times daily. Take 1 tablet day before chemo and 1 tablet day after  chemo with food 8 tablet 0  ? ibuprofen (ADVIL) 200 MG tablet Take by mouth.    ? lidocaine-prilocaine (EMLA) cream Apply to affected area once 30 g 3  ? Multiple Vitamins-Minerals (MULTIVITAMIN WITH MINERALS) tablet Take 1 tablet by mouth daily.    ? ondansetron (ZOFRAN) 8 MG tablet Take 1 tablet (8 mg total) by mouth 2 (two) times daily as needed for refractory nausea / vomiting. Start on day 3 after chemo. 30 tablet 1  ? prochlorperazine (COMPAZINE) 10 MG tablet Take 1 tablet (10 mg total) by mouth every 6 (six) hours as needed (Nausea or vomiting). 30 tablet 1  ? traMADol (ULTRAM) 50 MG tablet Take 1 tablet (50 mg total) by mouth every 6 (six) hours as needed for moderate pain. 25 tablet 0  ? ?No current facility-administered medications for this visit.  ? ? ?PHYSICAL EXAMINATION: ?ECOG PERFORMANCE STATUS: 1 - Symptomatic but completely ambulatory ? ?Vitals:  ? 06/29/21 0849  ?BP: 108/65  ?Pulse: 93  ?Resp: 18  ?Temp: (!) 97.2 ?F (36.2 ?C)  ?SpO2: 100%  ? ?Filed Weights  ? 06/29/21 0849  ?Weight: 189 lb 11.2 oz (86 kg)  ? ?  ? ?LABORATORY DATA:  ?I have reviewed the data as listed ? ?  Latest Ref Rng & Units 06/22/2021  ? 10:37 AM  ?CMP  ?Glucose 70 - 99 mg/dL 127    ?BUN 6 - 20 mg/dL 16    ?Creatinine 0.44 -  1.00 mg/dL 1.20    ?Sodium 135 - 145 mmol/L 135    ?Potassium 3.5 - 5.1 mmol/L 4.1    ?Chloride 98 - 111 mmol/L 103    ?CO2 22 - 32 mmol/L 23    ?Calcium 8.9 - 10.3 mg/dL 9.4    ?Total Protein 6.5 - 8.1 g/dL 7.2    ?Total Bilirubin 0.3 - 1.2 mg/dL 0.5    ?Alkaline Phos 38 - 126 U/L 55    ?AST 15 - 41 U/L 18    ?ALT 0 - 44 U/L 17    ? ? ?Lab Results  ?Component Value Date  ? WBC 30.0 (H) 06/29/2021  ? HGB 13.1 06/29/2021  ? HCT 37.5 06/29/2021  ? MCV 87.4 06/29/2021  ? PLT 291 06/29/2021  ? NEUTROABS PENDING 06/29/2021  ? ? ?ASSESSMENT & PLAN:  ?Malignant neoplasm of upper-outer quadrant of right breast in female, estrogen receptor negative ([place]) ?04/21/2021:Screening mammogram detected right breast mass 6 mm by  ultrasound, axilla negative, biopsy: Grade 2 IDC ER 0%, PR 0%, HER2 2+ by IHC ?05/11/2021:Right lumpectomy: Grade 2 IDC 0.9 cm, margins negative, angiolymphatic invasion present, 1 lymph node with micrometastases, 0/2 other lymph nodes negative, ER 0%, PR 0%, HER2 2+ by IHC and FISH negative with a ratio 1.33 Ki-67 25% ?  ?Treatment plan: ?1.  Adjuvant chemotherapy with Taxotere Cytoxan every 3 weeks x4 ?2.  Adjuvant radiation ?------------------------------------------------------------------------------------------------------------------- ?Current treatment: Cycle 1 day 8 Taxotere and Cytoxan ?Chemotoxicities: ?Mild nausea: Resolved with antinausea medication ?Mild diarrhea: Resolved with Imodium ?Yeast infection: Sent Diflucan ? ?Labs have been reviewed.  Patient did extremely well and she will receive the same dosage for cycle 2. ? ?Return to clinic in 2 weeks for cycle 2 ? ? ? ?No orders of the defined types were placed in this encounter. ? ?The patient has a good understanding of the overall plan. she agrees with it. she will call with any problems that may develop before the next visit here. ?Total time spent: 30 mins including face to face time and time spent for planning, charting and co-ordination of care ? ? Harriette Ohara, MD ?06/29/21 ? ? ? I Gardiner Coins am scribing for Dr. Lindi Adie ? ?I have reviewed the above documentation for accuracy and completeness, and I agree with the above. ?  ?

## 2021-06-16 ENCOUNTER — Ambulatory Visit: Payer: BC Managed Care – PPO

## 2021-06-16 DIAGNOSIS — Z483 Aftercare following surgery for neoplasm: Secondary | ICD-10-CM

## 2021-06-16 DIAGNOSIS — C50411 Malignant neoplasm of upper-outer quadrant of right female breast: Secondary | ICD-10-CM

## 2021-06-16 DIAGNOSIS — R293 Abnormal posture: Secondary | ICD-10-CM

## 2021-06-16 NOTE — Therapy (Signed)
?OUTPATIENT PHYSICAL THERAPY TREATMENT NOTE ? ? ?Patient Name: Natasha Ortega ?MRN: [id number] ?DOB:Sep 07, 1970, 51 y.o., female ?Today's Date: 06/16/2021 ? ?PCP: Louretta Shorten, MD ?REFERRING PROVIDER: Nicholas Lose, MD ? ?END OF SESSION:  ? PT End of Session - 06/16/21 0911   ? ? Visit Number 4   ? Number of Visits 10   ? Date for PT Re-Evaluation 07/04/21   ? PT Start Time [phone]   pt arrived late  ? PT Stop Time 1004   ? PT Time Calculation (min) 57 min   ? Activity Tolerance Patient tolerated treatment well   ? Behavior During Therapy [facility] for tasks assessed/performed   ? ?  ?  ? ?  ? ? ?Past Medical History:  ?Diagnosis Date  ? Cancer [facility])   ? breast  ? Headache   ? migraines  ? ?Past Surgical History:  ?Procedure Laterality Date  ? BREAST LUMPECTOMY WITH RADIOACTIVE SEED AND SENTINEL LYMPH NODE BIOPSY Right 05/11/2021  ? Procedure: RIGHT BREAST LUMPECTOMY WITH RADIOACTIVE SEED AND SENTINEL LYMPH NODE BIOPSY;  Surgeon: [name], MD;  Location: [place];  Service: General;  Laterality: Right;  ? PORTACATH PLACEMENT N/A 06/09/2021  ? Procedure: INSERTION PORT-A-CATH;  Surgeon: [name], MD;  Location: [place];  Service: General;  Laterality: N/A;  ? ?Patient Active Problem List  ? Diagnosis Date Noted  ? Genetic testing 05/09/2021  ? Family history of breast cancer 05/05/2021  ? Malignant neoplasm of upper-outer quadrant of right breast in female, estrogen receptor negative ([place]) 04/28/2021  ? ? ?REFERRING DIAG: Right breast cancer ? ?THERAPY DIAG:  ?Malignant neoplasm of upper-outer quadrant of right breast in female, estrogen receptor negative ([place]) ? ?Abnormal posture ? ?Aftercare following surgery for neoplasm ? ?PERTINENT HISTORY: Patient was diagnosed on 04/28/2021 with right grade 2 invasive ductal carcinoma breast cancer. She underwent a right lumpectomy and sentinel node biopsy on 05/11/2021. She had 1 node with micromets and 2 negative nodes (3 total) removed. It is ER/PR negative  and HER2 negative with a Ki67 of 40%.  ? ?PRECAUTIONS: Recent Surgery, right UE Lymphedema risk ? ?SUBJECTIVE: I felt fabulous after last session! I got my port in and the area is healing well but still really tender.  ? ?PAIN:  ?Are you having pain? No ? ? ? ?OBJECTIVE:  ?  ?PATIENT SURVEYS:  ?QUICK DASH: ?  Katina Dung - 06/06/21 0001   ?  ?  Open a tight or new jar No difficulty   ?  Do heavy household chores (wash walls, wash floors) Mild difficulty   ?  Carry a shopping bag or briefcase No difficulty   ?  Wash your back Moderate difficulty   ?  Use a knife to cut food No difficulty   ?  Recreational activities in which you take some force or impact through your arm, shoulder, or hand (golf, hammering, tennis) Mild difficulty   ?  During the past week, to what extent has your arm, shoulder or hand problem interfered with your normal social activities with family, friends, neighbors, or groups? Slightly   ?  During the past week, to what extent has your arm, shoulder or hand problem limited your work or other regular daily activities Slightly   ?  Arm, shoulder, or hand pain. None   ?  Tingling (pins and needles) in your arm, shoulder, or hand None   ?  Difficulty Sleeping No difficulty   ?  DASH Score 13.64 %   ?  ?   ?  ?  ?   ?  ?  ?  ?  OBSERVATIONS: ?           Incision is well healed in her left lateral breast. Glue still present on incision. She has lateral breast cording (see photo).  ?  ?POSTURE:  ?Forward head and rounded shoulders posture ?  ?UPPER EXTREMITY AROM/PROM: ?  ?A/PROM RIGHT  05/05/2021 ?  RIGHT 06/06/2021  ?Shoulder extension 49 45  ?Shoulder flexion 152 119  ?Shoulder abduction 164 130  ?Shoulder internal rotation 63 53  ?Shoulder external rotation 88 78  ?                        (Blank rows = not tested) ?  ?A/PROM LEFT  05/05/2021  ?Shoulder extension 60  ?Shoulder flexion 149  ?Shoulder abduction 162  ?Shoulder internal rotation 61  ?Shoulder external rotation 89  ?                         (Blank rows = not tested) ?  ?  ? ?  ?CERVICAL AROM: ?All within normal limits ?  ?PALPATION: ?Patient is very tender to palpation in right lateral ribs and just inferior to her right breast along her rib. ?  ?UPPER EXTREMITY STRENGTH: WNL ?  ?  ?LYMPHEDEMA ASSESSMENTS:  ?  ?[place] RIGHT  05/05/2021 RIGHT 06/06/2021  ?10 cm proximal to olecranon process 34.5 33.4  ?Olecranon process 26.9 26.8  ?10 cm proximal to ulnar styloid process 23.7 23.5  ?Just proximal to ulnar styloid process 16 15.9  ?Across hand at thumb web space 19.5 18.9  ?At base of 2nd digit 6.2 6.2  ?(Blank rows = not tested) ?  ?[place] LEFT  05/05/2021 LEFT 06/06/2021  ?10 cm proximal to olecranon process 34.2 34  ?Olecranon process 26 26.1  ?10 cm proximal to ulnar styloid process 22.8 23  ?Just proximal to ulnar styloid process 16.3 16.1  ?Across hand at thumb web space 19.5 18.9  ?At base of 2nd digit 6.2 6.1  ?(Blank rows = not tested) ?  ?Todays Treatment:  ?06/16/21:  ?Therapeutic Exercises: ?Pulleys into flexion x1 min and abd x2 mins returning therapist demo  ?Ball roll up wall into flex x10 and then Rt UE abd x5 with lean into end of stretches and returning therapist demo ?Manual Therapy: ?MFR: In Supine to Rt axilla  ?P/ROM: To Rt shoulder into flexion, abd and D2 to pts tolerance ?STM: In Lt S/L to Rt periscapular area and lateral trunk with cocoa butter, mild pressure as pt is tender to touch at lateral trunk ? ?06/07/21: ?Manual Therapy: ?MFR: In Supine to Rt axilla and lateral breast at areas of cording, and then also in Lt S/L to Rt lateral breast ?P/ROM: To Rt shoulder into flexion, abd and D2 to pts tolerance ?STM: In Lt S/L to Rt periscapular area and lateral trunk with cocoa butter, mild pressure as pt is tender to touch at lateral trunk ?  ?  ?PATIENT EDUCATION:  ?Education details: HEP and lymphedema education ?Person educated: Patient ?Education method: Explanation, Demonstration, and Handouts ?Education comprehension: verbalized  understanding and returned demonstration ?  ?  ?HOME EXERCISE PROGRAM: ?           Reviewed previously given post op HEP. Issued closed chain HEP. ?  ?  ?ASSESSMENT: ?  ?CLINICAL IMPRESSION: ?Progressed pt to include AA/ROM stretching with pulleys and ball roll up wall. She reports feeling good stretch at end motions in trunk and axilla. Then  continued with manual therapy working to decrease muscular tightness around Rt periscapular region and lateral trunk along with working to improve her end Rt shoulder P/ROM. Did not progress pt to supine scapular series as she is still healing from recent port placement.  ?  ?Pt will benefit from skilled therapeutic intervention to improve on the following deficits: Decreased knowledge of precautions, impaired UE functional use, pain, decreased ROM, postural dysfunction.  ?  ?PT treatment/interventions: ADL/Self care home management, Therapeutic exercises, Therapeutic activity, Neuromuscular re-education, Balance training, Gait training, Patient/Family education, Joint mobilization, Manual lymph drainage, scar mobilization, and Manual therapy ?  ?  ?  ?  ?GOALS: ?Goals reviewed with patient? Yes ?  ?LONG TERM GOALS:  (STG=LTG) ?  ?GOALS Name Target Date Goal status  ?1 Pt will demonstrate she has regained full shoulder ROM and function post operatively compared to baselines.  ?Baseline: 06/30/2021 IN PROGRESS  ?2 Patient will increase right flexion and abduction to >/= 150 degrees for increased ease reaching overhead. 07/04/2021 INITIAL  ?3 Patient will improve her DASH score to be </= 5 for improved overall UE function. 07/04/2021 INITIAL  ?4 Patient will demonstrate her visible right breast cording has resolved (compared to photo taken today) 07/04/2021 INITIAL  ?5 Patient will report >/= 25% improvement in right lateral trunk pain to tolerate cleaning and home tasks. 07/04/2021 INITIAL  ?  ?  ?  ?PLAN: ?PT FREQUENCY/DURATION: 2x/week for 4 weeks ?  ?PLAN FOR NEXT SESSION: Cont  myofascial release on right lateral breast; AAROM exercises for lateral trunk stretching and progress pt to include supine scapular series next. . ? ? ? ?Otelia Limes, PTA ?06/16/2021, 12:05 PM

## 2021-06-20 ENCOUNTER — Inpatient Hospital Stay: Payer: BC Managed Care – PPO

## 2021-06-20 ENCOUNTER — Ambulatory Visit: Payer: BC Managed Care – PPO | Attending: Hematology and Oncology

## 2021-06-20 ENCOUNTER — Inpatient Hospital Stay: Payer: BC Managed Care – PPO | Admitting: Hematology and Oncology

## 2021-06-20 DIAGNOSIS — R293 Abnormal posture: Secondary | ICD-10-CM | POA: Diagnosis present

## 2021-06-20 DIAGNOSIS — Z171 Estrogen receptor negative status [ER-]: Secondary | ICD-10-CM | POA: Insufficient documentation

## 2021-06-20 DIAGNOSIS — C50411 Malignant neoplasm of upper-outer quadrant of right female breast: Secondary | ICD-10-CM | POA: Diagnosis present

## 2021-06-20 DIAGNOSIS — Z483 Aftercare following surgery for neoplasm: Secondary | ICD-10-CM | POA: Insufficient documentation

## 2021-06-20 NOTE — Patient Instructions (Signed)

## 2021-06-20 NOTE — Therapy (Signed)
?OUTPATIENT PHYSICAL THERAPY TREATMENT NOTE ? ? ?Patient Name: Natasha Ortega ?MRN: [id number] ?DOB:1970/05/30, 51 y.o., female ?Today's Date: 06/20/2021 ? ?PCP: Louretta Shorten, MD ?REFERRING PROVIDER: Nicholas Lose, MD ? ?END OF SESSION:  ? PT End of Session - 06/20/21 1503   ? ? Visit Number 5   ? Number of Visits 10   ? Date for PT Re-Evaluation 07/04/21   ? PT Start Time 1500   ? PT Stop Time 1601   ? PT Time Calculation (min) 61 min   ? Activity Tolerance Patient tolerated treatment well   ? Behavior During Therapy [facility] for tasks assessed/performed   ? ?  ?  ? ?  ? ? ?Past Medical History:  ?Diagnosis Date  ? Cancer [facility])   ? breast  ? Headache   ? migraines  ? ?Past Surgical History:  ?Procedure Laterality Date  ? BREAST LUMPECTOMY WITH RADIOACTIVE SEED AND SENTINEL LYMPH NODE BIOPSY Right 05/11/2021  ? Procedure: RIGHT BREAST LUMPECTOMY WITH RADIOACTIVE SEED AND SENTINEL LYMPH NODE BIOPSY;  Surgeon: [name], MD;  Location: [facility];  Service: General;  Laterality: Right;  ? PORTACATH PLACEMENT N/A 06/09/2021  ? Procedure: INSERTION PORT-A-CATH;  Surgeon: [name], MD;  Location: [place];  Service: General;  Laterality: N/A;  ? ?Patient Active Problem List  ? Diagnosis Date Noted  ? Genetic testing 05/09/2021  ? Family history of breast cancer 05/05/2021  ? Malignant neoplasm of upper-outer quadrant of right breast in female, estrogen receptor negative ([place]) 04/28/2021  ? ? ?REFERRING DIAG: Right breast cancer ? ?THERAPY DIAG:  ?Malignant neoplasm of upper-outer quadrant of right breast in female, estrogen receptor negative ([place]) ? ?Abnormal posture ? ?Aftercare following surgery for neoplasm ? ?PERTINENT HISTORY: Patient was diagnosed on 04/28/2021 with right grade 2 invasive ductal carcinoma breast cancer. She underwent a right lumpectomy and sentinel node biopsy on 05/11/2021. She had 1 node with micromets and 2 negative nodes (3 total) removed. It is ER/PR negative and HER2 negative  with a Ki67 of 40%.  ? ?PRECAUTIONS: Recent Surgery, right UE Lymphedema risk ? ?SUBJECTIVE: I was able to shave my armpit and I can tell the cording at the side of my breast is a little less pronounced as well.  ? ?PAIN:  ?Are you having pain? No ? ? ? ?OBJECTIVE:  ?  ?PATIENT SURVEYS:  ?QUICK DASH: ?  Katina Dung - 06/06/21 0001   ?  ?  Open a tight or new jar No difficulty   ?  Do heavy household chores (wash walls, wash floors) Mild difficulty   ?  Carry a shopping bag or briefcase No difficulty   ?  Wash your back Moderate difficulty   ?  Use a knife to cut food No difficulty   ?  Recreational activities in which you take some force or impact through your arm, shoulder, or hand (golf, hammering, tennis) Mild difficulty   ?  During the past week, to what extent has your arm, shoulder or hand problem interfered with your normal social activities with family, friends, neighbors, or groups? Slightly   ?  During the past week, to what extent has your arm, shoulder or hand problem limited your work or other regular daily activities Slightly   ?  Arm, shoulder, or hand pain. None   ?  Tingling (pins and needles) in your arm, shoulder, or hand None   ?  Difficulty Sleeping No difficulty   ?  DASH Score 13.64 %   ?  ?   ?  ?  ?   ?  ?  ?  ?  OBSERVATIONS: ?           Incision is well healed in her left lateral breast. Glue still present on incision. She has lateral breast cording (see photo).  ?  ?POSTURE:  ?Forward head and rounded shoulders posture ?  ?UPPER EXTREMITY AROM/PROM: ?  ?A/PROM RIGHT  05/05/2021 ?  RIGHT 06/06/2021  ?Shoulder extension 49 45  ?Shoulder flexion 152 119  ?Shoulder abduction 164 130  ?Shoulder internal rotation 63 53  ?Shoulder external rotation 88 78  ?                        (Blank rows = not tested) ?  ?A/PROM LEFT  05/05/2021  ?Shoulder extension 60  ?Shoulder flexion 149  ?Shoulder abduction 162  ?Shoulder internal rotation 61  ?Shoulder external rotation 89  ?                        (Blank  rows = not tested) ?  ?  ? ?  ?CERVICAL AROM: ?All within normal limits ?  ?PALPATION: ?Patient is very tender to palpation in right lateral ribs and just inferior to her right breast along her rib. ?  ?UPPER EXTREMITY STRENGTH: WNL ?  ?  ?LYMPHEDEMA ASSESSMENTS:  ?  ?[place] RIGHT  05/05/2021 RIGHT 06/06/2021  ?10 cm proximal to olecranon process 34.5 33.4  ?Olecranon process 26.9 26.8  ?10 cm proximal to ulnar styloid process 23.7 23.5  ?Just proximal to ulnar styloid process 16 15.9  ?Across hand at thumb web space 19.5 18.9  ?At base of 2nd digit 6.2 6.2  ?(Blank rows = not tested) ?  ?[place] LEFT  05/05/2021 LEFT 06/06/2021  ?10 cm proximal to olecranon process 34.2 34  ?Olecranon process 26 26.1  ?10 cm proximal to ulnar styloid process 22.8 23  ?Just proximal to ulnar styloid process 16.3 16.1  ?Across hand at thumb web space 19.5 18.9  ?At base of 2nd digit 6.2 6.1  ?(Blank rows = not tested) ?  ?Todays Treatment:  ?06/20/21: ?Therapeutic Exercises: ?Supine Scapular Series with yellow theraband x5-7 each, returning therapist demo for each and handout issued ?Manual Therapy: ?MFR: In Supine to Rt axilla and lateral breast at areas of cording, and then also in Lt S/L to Rt lateral breast ?P/ROM: To Rt shoulder into flexion, abd and D2 to pts tolerance ?STM: In Lt S/L to Rt lateral trunk ?MLD: Rt inguinal nodes, Rt axillo-inguinal anastomosis and focused on Rt lateral trunk at area of fullness ? ?06/16/21:  ?Therapeutic Exercises: ?Pulleys into flexion x1 min and abd x2 mins returning therapist demo  ?Ball roll up wall into flex x10 and then Rt UE abd x5 with lean into end of stretches and returning therapist demo ?Manual Therapy: ?MFR: In Supine to Rt axilla  ?P/ROM: To Rt shoulder into flexion, abd and D2 to pts tolerance ?STM: In Lt S/L to Rt periscapular area and lateral trunk with cocoa butter, mild pressure as pt is tender to touch at lateral trunk ? ?06/07/21: ?Manual Therapy: ?MFR: In Supine to Rt axilla and  lateral breast at areas of cording, and then also in Lt S/L to Rt lateral breast ?P/ROM: To Rt shoulder into flexion, abd and D2 to pts tolerance ?STM: In Lt S/L to Rt periscapular area and lateral trunk with cocoa butter, mild pressure as pt is tender to touch at lateral trunk ?  ?  ?PATIENT EDUCATION:  ?Education details: HEP  and lymphedema education ?Person educated: Patient ?Education method: Explanation, Demonstration, and Handouts ?Education comprehension: verbalized understanding and returned demonstration ?  ?  ?HOME EXERCISE PROGRAM: ?           Reviewed previously given post op HEP. Issued closed chain HEP. ?  ?  ?ASSESSMENT: ?  ?CLINICAL IMPRESSION: ?Progressed pt to include supine scapular series and instructed her to only do these 2-3x/wk for now until cording is improved. She verbalized good understanding. Then continued with manual therapy working to decrease cording at Lt lateral breast and tightness along Lt lateral trunk. Her cording is some improved visibly today per pt and therapist and pt was very pleased by this along with her end Rt shoulder ROM being improved as well.   ?  ?Pt will benefit from skilled therapeutic intervention to improve on the following deficits: Decreased knowledge of precautions, impaired UE functional use, pain, decreased ROM, postural dysfunction.  ?  ?PT treatment/interventions: ADL/Self care home management, Therapeutic exercises, Therapeutic activity, Neuromuscular re-education, Balance training, Gait training, Patient/Family education, Joint mobilization, Manual lymph drainage, scar mobilization, and Manual therapy ?  ?  ?  ?  ?GOALS: ?Goals reviewed with patient? Yes ?  ?LONG TERM GOALS:  (STG=LTG) ?  ?GOALS Name Target Date Goal status  ?1 Pt will demonstrate she has regained full shoulder ROM and function post operatively compared to baselines.  ?Baseline: 06/30/2021 IN PROGRESS  ?2 Patient will increase right flexion and abduction to >/= 150 degrees for increased  ease reaching overhead. 07/04/2021 INITIAL  ?3 Patient will improve her DASH score to be </= 5 for improved overall UE function. 07/04/2021 INITIAL  ?4 Patient will demonstrate her visible right breast c

## 2021-06-21 ENCOUNTER — Encounter: Payer: Self-pay | Admitting: Hematology and Oncology

## 2021-06-21 DIAGNOSIS — Z95828 Presence of other vascular implants and grafts: Secondary | ICD-10-CM

## 2021-06-21 HISTORY — DX: Presence of other vascular implants and grafts: Z95.828

## 2021-06-22 ENCOUNTER — Inpatient Hospital Stay: Payer: BC Managed Care – PPO | Attending: Hematology and Oncology

## 2021-06-22 ENCOUNTER — Inpatient Hospital Stay (HOSPITAL_BASED_OUTPATIENT_CLINIC_OR_DEPARTMENT_OTHER): Payer: BC Managed Care – PPO | Admitting: Hematology and Oncology

## 2021-06-22 ENCOUNTER — Other Ambulatory Visit: Payer: Self-pay

## 2021-06-22 ENCOUNTER — Inpatient Hospital Stay: Payer: BC Managed Care – PPO

## 2021-06-22 VITALS — BP 128/90 | HR 82 | Temp 97.6°F | Resp 18 | Wt 193.5 lb

## 2021-06-22 DIAGNOSIS — Z95828 Presence of other vascular implants and grafts: Secondary | ICD-10-CM | POA: Insufficient documentation

## 2021-06-22 DIAGNOSIS — B379 Candidiasis, unspecified: Secondary | ICD-10-CM | POA: Insufficient documentation

## 2021-06-22 DIAGNOSIS — Z171 Estrogen receptor negative status [ER-]: Secondary | ICD-10-CM

## 2021-06-22 DIAGNOSIS — Z79899 Other long term (current) drug therapy: Secondary | ICD-10-CM | POA: Insufficient documentation

## 2021-06-22 DIAGNOSIS — Z5189 Encounter for other specified aftercare: Secondary | ICD-10-CM | POA: Insufficient documentation

## 2021-06-22 DIAGNOSIS — C50411 Malignant neoplasm of upper-outer quadrant of right female breast: Secondary | ICD-10-CM

## 2021-06-22 DIAGNOSIS — Z5111 Encounter for antineoplastic chemotherapy: Secondary | ICD-10-CM | POA: Insufficient documentation

## 2021-06-22 DIAGNOSIS — C773 Secondary and unspecified malignant neoplasm of axilla and upper limb lymph nodes: Secondary | ICD-10-CM | POA: Insufficient documentation

## 2021-06-22 DIAGNOSIS — Z1379 Encounter for other screening for genetic and chromosomal anomalies: Secondary | ICD-10-CM

## 2021-06-22 LAB — CMP (CANCER CENTER ONLY)
ALT: 17 U/L (ref 0–44)
AST: 18 U/L (ref 15–41)
Albumin: 4.1 g/dL (ref 3.5–5.0)
Alkaline Phosphatase: 55 U/L (ref 38–126)
Anion gap: 9 (ref 5–15)
BUN: 16 mg/dL (ref 6–20)
CO2: 23 mmol/L (ref 22–32)
Calcium: 9.4 mg/dL (ref 8.9–10.3)
Chloride: 103 mmol/L (ref 98–111)
Creatinine: 1.2 mg/dL — ABNORMAL HIGH (ref 0.44–1.00)
GFR, Estimated: 54 mL/min — ABNORMAL LOW (ref >60)
Glucose, Bld: 127 mg/dL — ABNORMAL HIGH (ref 70–99)
Potassium: 4.1 mmol/L (ref 3.5–5.1)
Sodium: 135 mmol/L (ref 135–145)
Total Bilirubin: 0.5 mg/dL (ref 0.3–1.2)
Total Protein: 7.2 g/dL (ref 6.5–8.1)

## 2021-06-22 LAB — CBC WITH DIFFERENTIAL (CANCER CENTER ONLY)
Abs Immature Granulocytes: 0.03 10*3/uL (ref 0.00–0.07)
Basophils Absolute: 0 10*3/uL (ref 0.0–0.1)
Basophils Relative: 0 %
Eosinophils Absolute: 0 10*3/uL (ref 0.0–0.5)
Eosinophils Relative: 0 %
HCT: 39.5 % (ref 36.0–46.0)
Hemoglobin: 13.4 g/dL (ref 12.0–15.0)
Immature Granulocytes: 0 %
Lymphocytes Relative: 27 %
Lymphs Abs: 2.9 10*3/uL (ref 0.7–4.0)
MCH: 30 pg (ref 26.0–34.0)
MCHC: 33.9 g/dL (ref 30.0–36.0)
MCV: 88.4 fL (ref 80.0–100.0)
Monocytes Absolute: 0.7 10*3/uL (ref 0.1–1.0)
Monocytes Relative: 7 %
Neutro Abs: 6.9 10*3/uL (ref 1.7–7.7)
Neutrophils Relative %: 66 %
Platelet Count: 333 10*3/uL (ref 150–400)
RBC: 4.47 MIL/uL (ref 3.87–5.11)
RDW: 12.1 % (ref 11.5–15.5)
WBC Count: 10.6 10*3/uL — ABNORMAL HIGH (ref 4.0–10.5)
nRBC: 0 % (ref 0.0–0.2)

## 2021-06-22 LAB — PREGNANCY, URINE: Preg Test, Ur: NEGATIVE

## 2021-06-22 MED ORDER — PALONOSETRON HCL INJECTION 0.25 MG/5ML
0.2500 mg | Freq: Once | INTRAVENOUS | Status: AC
  Administered 2021-06-22: 0.25 mg via INTRAVENOUS
  Filled 2021-06-22: qty 5

## 2021-06-22 MED ORDER — SODIUM CHLORIDE 0.9 % IV SOLN
600.0000 mg/m2 | Freq: Once | INTRAVENOUS | Status: AC
  Administered 2021-06-22: 1180 mg via INTRAVENOUS
  Filled 2021-06-22: qty 59

## 2021-06-22 MED ORDER — SODIUM CHLORIDE 0.9% FLUSH
10.0000 mL | INTRAVENOUS | Status: DC | PRN
  Administered 2021-06-22: 10 mL

## 2021-06-22 MED ORDER — SODIUM CHLORIDE 0.9 % IV SOLN
10.0000 mg | Freq: Once | INTRAVENOUS | Status: AC
  Administered 2021-06-22: 10 mg via INTRAVENOUS
  Filled 2021-06-22: qty 10

## 2021-06-22 MED ORDER — SODIUM CHLORIDE 0.9 % IV SOLN: Freq: Once | INTRAVENOUS | Status: AC

## 2021-06-22 MED ORDER — SODIUM CHLORIDE 0.9 % IV SOLN
75.0000 mg/m2 | Freq: Once | INTRAVENOUS | Status: DC
  Administered 2021-06-22: 150 mg via INTRAVENOUS
  Filled 2021-06-22: qty 15

## 2021-06-22 MED ORDER — SODIUM CHLORIDE 0.9% FLUSH
10.0000 mL | Freq: Once | INTRAVENOUS | Status: AC
  Administered 2021-06-22: 10 mL

## 2021-06-22 MED ORDER — HEPARIN SOD (PORK) LOCK FLUSH 100 UNIT/ML IV SOLN
500.0000 [IU] | Freq: Once | INTRAVENOUS | Status: AC | PRN
  Administered 2021-06-22: 500 [IU]

## 2021-06-22 NOTE — Research (Signed)
Trial: DCP-001: Use of a Clinical Trial Screening Tool to Address Cancer Health Disparities in the [place] [facility]) ? ?Patient Natasha Ortega was identified by this RN as a potential candidate for the above listed study.  This Clinical Research Nurse met with Natasha Ortega, [name], on 06/22/21 in a manner and location that ensures patient privacy to discuss participation in the above listed research study.  Patient is Unaccompanied.  A copy of the informed consent document and separate HIPAA Authorization was provided to the patient.  Patient reads, speaks, and understands [place].   ? ?Patient was provided with the business card of this Nurse and encouraged to contact the research team with any questions.  Patient was provided the option of taking informed consent documents home to review and was encouraged to review at their convenience with their support network, including other care providers. Patient is comfortable with making a decision regarding study participation today. ? ?As outlined in the informed consent form, this Nurse and Larose Kells discussed the purpose of the research study, the investigational nature of the study, study procedures and requirements for study participation, potential risks and benefits of study participation, as well as alternatives to participation. This study is not blinded. The patient understands participation is voluntary and they may withdraw from study participation at any time.  This study does not involve randomization.  This study does not involve an investigational drug or device. This study does not involve a placebo. Patient understands enrollment is pending full eligibility review.  ? ?Confidentiality and how the patient's information will be used as part of study participation were discussed.  Patient was informed there is not reimbursement provided for their time and effort spent on trial participation.  The patient is  encouraged to discuss research study participation with their insurance provider to determine what costs they may incur as part of study participation, including research related injury.   ? ?All questions were answered to patient's satisfaction.  The informed consent and separate HIPAA Authorization was reviewed page by page.  The patient's mental and emotional status is appropriate to provide informed consent, and the patient verbalizes an understanding of study participation.  Patient has agreed to participate in the above listed research study and has voluntarily signed the informed consent dated 06/21/21 and separate HIPAA Authorization, version 5  on 06/22/21 at 1:15PM.  The patient was provided with a copy of the signed informed consent form and separate HIPAA Authorization for their reference.  No study specific procedures were obtained prior to the signing of the informed consent document.  Approximately 15 minutes were spent with the patient reviewing the informed consent documents.  Patient was not requested to complete a Release of Information form. ? ?Marjie Skiff. Baker Moronta, RN, BSN, CHPN ?She  Her  Hers ?Clinical Research Nurse ?[place] ?Direct Dial [phone]  Pager [phone] ?06/22/2021 1:35 PM ?

## 2021-06-22 NOTE — Patient Instructions (Signed)
[facility]  Discharge Instructions: ?Thank you for choosing [facility] to provide your oncology and hematology care.  ? ?If you have a lab appointment with the [facility], please go directly to the [facility] and check in at the registration area. ?  ?Wear comfortable clothing and clothing appropriate for easy access to any Portacath or PICC line.  ? ?We strive to give you quality time with your provider. You may need to reschedule your appointment if you arrive late (15 or more minutes).  Arriving late affects you and other patients whose appointments are after yours.  Also, if you miss three or more appointments without notifying the office, you may be dismissed from the clinic at the provider?s discretion.    ?  ?For prescription refill requests, have your pharmacy contact our office and allow 72 hours for refills to be completed.   ? ?Today you received the following chemotherapy and/or immunotherapy agents: Taxotere, Cytoxan.     ?  ?To help prevent nausea and vomiting after your treatment, we encourage you to take your nausea medication as directed. ? ?BELOW ARE SYMPTOMS THAT SHOULD BE REPORTED IMMEDIATELY: ?*FEVER GREATER THAN 100.4 F (38 ?C) OR HIGHER ?*CHILLS OR SWEATING ?*NAUSEA AND VOMITING THAT IS NOT CONTROLLED WITH YOUR NAUSEA MEDICATION ?*UNUSUAL SHORTNESS OF BREATH ?*UNUSUAL BRUISING OR BLEEDING ?*URINARY PROBLEMS (pain or burning when urinating, or frequent urination) ?*BOWEL PROBLEMS (unusual diarrhea, constipation, pain near the anus) ?TENDERNESS IN MOUTH AND THROAT WITH OR WITHOUT PRESENCE OF ULCERS (sore throat, sores in mouth, or a toothache) ?UNUSUAL RASH, SWELLING OR PAIN  ?UNUSUAL VAGINAL DISCHARGE OR ITCHING  ? ?Items with * indicate a potential emergency and should be followed up as soon as possible or go to the Emergency Department if any problems should occur. ? ?Please show the CHEMOTHERAPY ALERT CARD or IMMUNOTHERAPY ALERT CARD at  check-in to the Emergency Department and triage nurse. ? ?Should you have questions after your visit or need to cancel or reschedule your appointment, please contact [facility]  Dept: [phone]  and follow the prompts.  Office hours are 8:00 a.m. to 4:30 p.m. Monday - Friday. Please note that voicemails left after 4:00 p.m. may not be returned until the following business day.  We are closed weekends and major holidays. You have access to a nurse at all times for urgent questions. Please call the main number to the clinic Dept: [phone] and follow the prompts. ? ? ?For any non-urgent questions, you may also contact your provider using MyChart. We now offer e-Visits for anyone 71 and older to request care online for non-urgent symptoms. For details visit [email or web address]. ?  ?Also download the MyChart app! Go to the app store, search "MyChart", open the app, select , and log in with your MyChart username and password. ? ?Due to Covid, a mask is required upon entering the hospital/clinic. If you do not have a mask, one will be given to you upon arrival. For doctor visits, patients may have 1 support person aged 22 or older with them. For treatment visits, patients cannot have anyone with them due to current Covid guidelines and our immunocompromised population.  ? ?Docetaxel injection ?What is this medication? ?DOCETAXEL (doe se TAX el) is a chemotherapy drug. It targets fast dividing cells, like cancer cells, and causes these cells to die. This medicine is used to treat many types of cancers like breast cancer, certain stomach cancers, head and  neck cancer, lung cancer, and prostate cancer. ?This medicine may be used for other purposes; ask your health care provider or pharmacist if you have questions. ?COMMON BRAND NAME(S): Docefrez, Taxotere ?What should I tell my care team before I take this medication? ?They need to know if you have any of these  conditions: ?infection (especially a virus infection such as chickenpox, cold sores, or herpes) ?liver disease ?low blood counts, like low white cell, platelet, or red cell counts ?an unusual or allergic reaction to docetaxel, polysorbate 80, other chemotherapy agents, other medicines, foods, dyes, or preservatives ?pregnant or trying to get pregnant ?breast-feeding ?How should I use this medication? ?This drug is given as an infusion into a vein. It is administered in a hospital or clinic by a specially trained health care professional. ?Talk to your pediatrician regarding the use of this medicine in children. Special care may be needed. ?Overdosage: If you think you have taken too much of this medicine contact a poison control center or emergency room at once. ?NOTE: This medicine is only for you. Do not share this medicine with others. ?What if I miss a dose? ?It is important not to miss your dose. Call your doctor or health care professional if you are unable to keep an appointment. ?What may interact with this medication? ?Do not take this medicine with any of the following medications: ?live virus vaccines ?This medicine may also interact with the following medications: ?aprepitant ?certain antibiotics like erythromycin or clarithromycin ?certain antivirals for HIV or hepatitis ?certain medicines for fungal infections like fluconazole, itraconazole, ketoconazole, posaconazole, or voriconazole ?cimetidine ?ciprofloxacin ?conivaptan ?cyclosporine ?dronedarone ?fluvoxamine ?grapefruit juice ?imatinib ?verapamil ?This list may not describe all possible interactions. Give your health care provider a list of all the medicines, herbs, non-prescription drugs, or dietary supplements you use. Also tell them if you smoke, drink alcohol, or use illegal drugs. Some items may interact with your medicine. ?What should I watch for while using this medication? ?Your condition will be monitored carefully while you are receiving  this medicine. You will need important blood work done while you are taking this medicine. ?Call your doctor or health care professional for advice if you get a fever, chills or sore throat, or other symptoms of a cold or flu. Do not treat yourself. This drug decreases your body's ability to fight infections. Try to avoid being around people who are sick. ?Some products may contain alcohol. Ask your health care professional if this medicine contains alcohol. Be sure to tell all health care professionals you are taking this medicine. Certain medicines, like metronidazole and disulfiram, can cause an unpleasant reaction when taken with alcohol. The reaction includes flushing, headache, nausea, vomiting, sweating, and increased thirst. The reaction can last from 30 minutes to several hours. ?You may get drowsy or dizzy. Do not drive, use machinery, or do anything that needs mental alertness until you know how this medicine affects you. Do not stand or sit up quickly, especially if you are an older patient. This reduces the risk of dizzy or fainting spells. Alcohol may interfere with the effect of this medicine. ?Talk to your health care professional about your risk of cancer. You may be more at risk for certain types of cancer if you take this medicine. ?Do not become pregnant while taking this medicine or for 6 months after stopping it. Women should inform their doctor if they wish to become pregnant or think they might be pregnant. There is a potential for serious side  effects to an unborn child. Talk to your health care professional or pharmacist for more information. Do not breast-feed an infant while taking this medicine or for 1 week after stopping it. ?Males who get this medicine must use a condom during sex with females who can get pregnant. If you get a woman pregnant, the baby could have birth defects. The baby could die before they are born. You will need to continue wearing a condom for 3 months after  stopping the medicine. Tell your health care provider right away if your partner becomes pregnant while you are taking this medicine. ?This may interfere with the ability to father a child. You should talk to your doct

## 2021-06-22 NOTE — Research (Signed)
[id number] - TREATMENT OF REFRACTORY NAUSEA ? ?Met with patient prior to initiation of first chemo. She did not get a chance to complete baseline questionnaires, so I gave her a copy to complete prior to chemo.  ? ?Returned to infusion room and picked up the completed baseline questionnaires. Gave her the Cycle 1 forms that have instructions on when to complete each page. Plan made with patient to call her Monday for C1D4 call, as it falls on Saturday. She will be in the clinic on Wednesday for an appt with Dr Lindi Adie and a port flush, so we will pick up with questionnaires then instead of having her mail them back. ? ?Marjie Skiff Jaxsyn Catalfamo, RN, BSN, CHPN ?She  Her  Hers ?Clinical Research Nurse ?[place] ?Direct Dial [phone]  Pager [phone] ?06/22/2021 1:38 PM ?

## 2021-06-22 NOTE — Research (Signed)
DCP-001: Use of a Clinical Trial Screening Tool to Address Cancer Health Disparities in the [facility]) ? ?This Nurse has reviewed this patient's inclusion and exclusion criteria and confirmed Natasha Ortega is eligible for study participation.  Patient will continue with enrollment. ? ?Eligibility confirmed by treating investigator, who also agrees that patient should proceed with enrollment. ? ?Marjie Skiff. Navie Lamoreaux, RN, BSN, CHPN ?She  Her  Hers ?Clinical Research Nurse ?[place] ?Direct Dial [phone]  Pager [phone] ?06/22/2021 1:36 PM ?

## 2021-06-22 NOTE — Assessment & Plan Note (Signed)
04/21/2021:Screening mammogram detected right breast mass 6 mm by ultrasound, axilla negative, biopsy: Grade 2 IDC ER 0%, PR 0%, HER2 2+ by IHC ?05/11/2021:Right lumpectomy: Grade 2 IDC 0.9 cm, margins negative, angiolymphatic invasion present, 1 lymph node with micrometastases, 0/2 other lymph nodes negative, ER 0%, PR 0%, HER2 2+ by IHC and FISH negative with a ratio 1.33 Ki-67 25% ?? ?Treatment plan: ?1.??Adjuvant chemotherapy with Taxotere Cytoxan every 3 weeks x4 ?2.??Adjuvant radiation ?----------------------------------------------------------------------------------------------------------------------------- ?Current treatment: Cycle 1 Taxotere Cytoxan ?Labs reviewed, chemo consent obtained, chemo education completed, antiemetics were reviewed ?Return to clinic in 1 week for toxicity check ?

## 2021-06-22 NOTE — Progress Notes (Signed)
RN confirmed today's weight at 193.8 pounds with BSA: 1.99 m2 ? ?Henreitta Leber, PharmD ?

## 2021-06-23 ENCOUNTER — Telehealth: Payer: Self-pay

## 2021-06-23 ENCOUNTER — Ambulatory Visit: Payer: BC Managed Care – PPO

## 2021-06-23 DIAGNOSIS — R293 Abnormal posture: Secondary | ICD-10-CM

## 2021-06-23 DIAGNOSIS — C50411 Malignant neoplasm of upper-outer quadrant of right female breast: Secondary | ICD-10-CM | POA: Diagnosis not present

## 2021-06-23 DIAGNOSIS — Z483 Aftercare following surgery for neoplasm: Secondary | ICD-10-CM

## 2021-06-23 LAB — GENETIC SCREENING ORDER

## 2021-06-23 NOTE — Therapy (Signed)
?OUTPATIENT PHYSICAL THERAPY TREATMENT NOTE ? ? ?Patient Name: Natasha Ortega ?MRN: [id number] ?DOB:Jul 15, 1970, 51 y.o., female ?Today's Date: 06/23/2021 ? ?PCP: Louretta Shorten, MD ?REFERRING PROVIDER: Louretta Shorten, MD ? ?END OF SESSION:  ? PT End of Session - 06/23/21 0804   ? ? Visit Number 6   ? Number of Visits 10   ? Date for PT Re-Evaluation 07/04/21   ? PT Start Time [phone]   ? PT Stop Time [phone]   ? PT Time Calculation (min) 60 min   ? Activity Tolerance Patient tolerated treatment well   ? Behavior During Therapy [facility] for tasks assessed/performed   ? ?  ?  ? ?  ? ? ?Past Medical History:  ?Diagnosis Date  ? Cancer [facility])   ? breast  ? Headache   ? migraines  ? ?Past Surgical History:  ?Procedure Laterality Date  ? BREAST LUMPECTOMY WITH RADIOACTIVE SEED AND SENTINEL LYMPH NODE BIOPSY Right 05/11/2021  ? Procedure: RIGHT BREAST LUMPECTOMY WITH RADIOACTIVE SEED AND SENTINEL LYMPH NODE BIOPSY;  Surgeon: [name], MD;  Location: [place];  Service: General;  Laterality: Right;  ? PORTACATH PLACEMENT N/A 06/09/2021  ? Procedure: INSERTION PORT-A-CATH;  Surgeon: [name], MD;  Location: [place];  Service: General;  Laterality: N/A;  ? ?Patient Active Problem List  ? Diagnosis Date Noted  ? Port-A-Cath in place 06/21/2021  ? Genetic testing 05/09/2021  ? Family history of breast cancer 05/05/2021  ? Malignant neoplasm of upper-outer quadrant of right breast in female, estrogen receptor negative ([place]) 04/28/2021  ? ? ?REFERRING DIAG: Right breast cancer ? ?THERAPY DIAG:  ?Malignant neoplasm of upper-outer quadrant of right breast in female, estrogen receptor negative ([place]) ? ?Abnormal posture ? ?Aftercare following surgery for neoplasm ? ?PERTINENT HISTORY: Patient was diagnosed on 04/28/2021 with right grade 2 invasive ductal carcinoma breast cancer. She underwent a right lumpectomy and sentinel node biopsy on 05/11/2021. She had 1 node with micromets and 2 negative nodes (3 total) removed. It is  ER/PR negative and HER2 negative with a Ki67 of 40%.  ? ?PRECAUTIONS: Recent Surgery, right UE Lymphedema risk ? ?SUBJECTIVE: I feel really good today. I slept good last night and I feel okay since having my first chemo treatment yesterday.   ? ?PAIN:  ?Are you having pain? No ? ? ? ?OBJECTIVE:  ?  ?PATIENT SURVEYS:  ?QUICK DASH: ?  Katina Dung - 06/06/21 0001   ?  ?  Open a tight or new jar No difficulty   ?  Do heavy household chores (wash walls, wash floors) Mild difficulty   ?  Carry a shopping bag or briefcase No difficulty   ?  Wash your back Moderate difficulty   ?  Use a knife to cut food No difficulty   ?  Recreational activities in which you take some force or impact through your arm, shoulder, or hand (golf, hammering, tennis) Mild difficulty   ?  During the past week, to what extent has your arm, shoulder or hand problem interfered with your normal social activities with family, friends, neighbors, or groups? Slightly   ?  During the past week, to what extent has your arm, shoulder or hand problem limited your work or other regular daily activities Slightly   ?  Arm, shoulder, or hand pain. None   ?  Tingling (pins and needles) in your arm, shoulder, or hand None   ?  Difficulty Sleeping No difficulty   ?  DASH  Score 13.64 %   ?  ?   ?  ?  ?   ?  ?  ?  ?OBSERVATIONS: ?           Incision is well healed in her left lateral breast. Glue still present on incision. She has lateral breast cording (see photo).  ?  ?POSTURE:  ?Forward head and rounded shoulders posture ?  ?UPPER EXTREMITY AROM/PROM: ?  ?A/PROM RIGHT  05/05/2021 ?  RIGHT 06/06/2021  ?Shoulder extension 49 45  ?Shoulder flexion 152 119  ?Shoulder abduction 164 130  ?Shoulder internal rotation 63 53  ?Shoulder external rotation 88 78  ?                        (Blank rows = not tested) ?  ?A/PROM LEFT  05/05/2021  ?Shoulder extension 60  ?Shoulder flexion 149  ?Shoulder abduction 162  ?Shoulder internal rotation 61  ?Shoulder external rotation 89  ?                         (Blank rows = not tested) ?  ?  ? ?  ?CERVICAL AROM: ?All within normal limits ?  ?PALPATION: ?Patient is very tender to palpation in right lateral ribs and just inferior to her right breast along her rib. ?  ?UPPER EXTREMITY STRENGTH: WNL ?  ?  ?LYMPHEDEMA ASSESSMENTS:  ?  ?LANDMARK RIGHT  05/05/2021 RIGHT 06/06/2021  ?10 cm proximal to olecranon process 34.5 33.4  ?Olecranon process 26.9 26.8  ?10 cm proximal to ulnar styloid process 23.7 23.5  ?Just proximal to ulnar styloid process 16 15.9  ?Across hand at thumb web space 19.5 18.9  ?At base of 2nd digit 6.2 6.2  ?(Blank rows = not tested) ?  ?[place] LEFT  05/05/2021 LEFT 06/06/2021  ?10 cm proximal to olecranon process 34.2 34  ?Olecranon process 26 26.1  ?10 cm proximal to ulnar styloid process 22.8 23  ?Just proximal to ulnar styloid process 16.3 16.1  ?Across hand at thumb web space 19.5 18.9  ?At base of 2nd digit 6.2 6.1  ?(Blank rows = not tested) ?  ?Todays Treatment: ? ?06/22/21: ?Therapeutic Exercises: ?Reviewed Supine Scapular Series with yellow theraband x5 each, pt able to return excellent technique only requiring VCs for slow, controlled pace with reps ?Manual Therapy: ?MFR: In Supine to Rt axilla at end motions, and then also in Lt S/L to Rt lateral breast cording which is much improved today ?P/ROM: To Rt shoulder into flexion, abd and D2 to pts tolerance ?STM: In Lt S/L over area of incision at and scar tissue surrounding same without, then with cocoa butter ?  MLD: Short neck, 5 diaphragmatic breaths, Rt inguinal nodes, Rt axillo-inguinal anastomosis and focused on Rt lateral trunk at area of fullness, then     continued same in Lt S/L to work over lateral breast at area of cording then finished retracing all steps in supine and did MLD intermittently during other manual   therapies today ? ?06/20/21: ?Therapeutic Exercises: ?Supine Scapular Series with yellow theraband x5-7 each, returning therapist demo for each and handout  issued ?Manual Therapy: ?MFR: In Supine to Rt axilla and lateral breast at areas of cording, and then also in Lt S/L to Rt lateral breast ?P/ROM: To Rt shoulder into flexion, abd and D2 to pts tolerance ?STM: In Lt S/L to Rt lateral trunk ?MLD: Rt inguinal nodes, Rt axillo-inguinal anastomosis and  focused on Rt lateral trunk at area of fullness ? ?06/16/21:  ?Therapeutic Exercises: ?Pulleys into flexion x1 min and abd x2 mins returning therapist demo  ?Ball roll up wall into flex x10 and then Rt UE abd x5 with lean into end of stretches and returning therapist demo ?Manual Therapy: ?MFR: In Supine to Rt axilla  ?P/ROM: To Rt shoulder into flexion, abd and D2 to pts tolerance ?STM: In Lt S/L to Rt periscapular area and lateral trunk with cocoa butter, mild pressure as pt is tender to touch at lateral trunk ? ?  ?  ?PATIENT EDUCATION:  ?Education details: HEP and lymphedema education ?Person educated: Patient ?Education method: Explanation, Demonstration, and Handouts ?Education comprehension: verbalized understanding and returned demonstration ?  ?  ?HOME EXERCISE PROGRAM: ?           Reviewed previously given post op HEP. Issued closed chain HEP. ?  ?  ?ASSESSMENT: ?  ?CLINICAL IMPRESSION: ?Reviewed supine scapular series with yellow theraband. Pt able to return excellent technique, just required VCs for slower pace during reps. Then continued manual therapy working to decrease myofascial restrictions from cording. This is much improved as evidenced by being less palpable and visible than at start of care. Pt is progressing well with her ROM as P/ROM is WNLs now, pt reports just feeling pull still from cording at end motions, though this has also improved.   ?  ?Pt will benefit from skilled therapeutic intervention to improve on the following deficits: Decreased knowledge of precautions, impaired UE functional use, pain, decreased ROM, postural dysfunction.  ?  ?PT treatment/interventions: ADL/Self care home  management, Therapeutic exercises, Therapeutic activity, Neuromuscular re-education, Balance training, Gait training, Patient/Family education, Joint mobilization, Manual lymph drainage, scar mobilization, and M

## 2021-06-23 NOTE — Telephone Encounter (Signed)
-----   Message from Rafael Bihari, RN sent at 06/22/2021  4:00 PM EDT ----- ?Regarding: Dr Lindi Adie pt first time Docetaxel and Cytoxan ?Dr Lindi Adie pt, came in for first time Docetaxel and Cytoxan. Tolerated well. Needs call back.  ? ?

## 2021-06-23 NOTE — Telephone Encounter (Signed)
LM for patient that this nurse was calling to see how they were doing after their treatment. Please call back to Dr. Gudena's nurse at [phone] if they have any questions or concerns regarding the treatment. 

## 2021-06-24 ENCOUNTER — Inpatient Hospital Stay: Payer: BC Managed Care – PPO

## 2021-06-24 ENCOUNTER — Other Ambulatory Visit: Payer: Self-pay

## 2021-06-24 VITALS — BP 122/92 | HR 66 | Temp 97.9°F | Resp 18

## 2021-06-24 DIAGNOSIS — Z171 Estrogen receptor negative status [ER-]: Secondary | ICD-10-CM

## 2021-06-24 DIAGNOSIS — Z5111 Encounter for antineoplastic chemotherapy: Secondary | ICD-10-CM | POA: Diagnosis not present

## 2021-06-24 MED ORDER — PEGFILGRASTIM-CBQV 6 MG/0.6ML ~~LOC~~ SOSY
6.0000 mg | PREFILLED_SYRINGE | Freq: Once | SUBCUTANEOUS | Status: AC
  Administered 2021-06-24: 6 mg via SUBCUTANEOUS
  Filled 2021-06-24: qty 0.6

## 2021-06-27 ENCOUNTER — Inpatient Hospital Stay: Payer: BC Managed Care – PPO | Admitting: Hematology and Oncology

## 2021-06-27 ENCOUNTER — Inpatient Hospital Stay: Payer: BC Managed Care – PPO

## 2021-06-27 ENCOUNTER — Telehealth: Payer: Self-pay

## 2021-06-27 ENCOUNTER — Encounter: Payer: Self-pay | Admitting: Hematology and Oncology

## 2021-06-27 NOTE — Telephone Encounter (Signed)
[id number] - TREATMENT OF REFRACTORY NAUSEA ? ?Called Ms Fesperman for her 4 day call. Day 4 was Saturday, so call was made today.  ? ?Was she reminded to complete questionnaires? Yes; she will bring to her appt on Wednesday ?Did she have a reportable AE? No ?Did she have nausea 3 or above? No ?Did she report vomiting? No ?Is she eligible to randomize to cycle 2? No ? ? ?Explained to patient that her participation in this study is complete, and thanked her for her time. Confirmed that a colleague will meet her at her appt Wednesday to pick up her questionnaires. ? ?Marjie Skiff Lennie Dunnigan, RN, BSN, CHPN ?She  Her  [facility] ?Clinical Research Nurse ?[place] ?Direct Dial [phone]  Pager [phone] ?06/27/2021 10:29 AM ?

## 2021-06-28 ENCOUNTER — Ambulatory Visit: Payer: BC Managed Care – PPO

## 2021-06-28 DIAGNOSIS — Z171 Estrogen receptor negative status [ER-]: Secondary | ICD-10-CM

## 2021-06-28 DIAGNOSIS — R293 Abnormal posture: Secondary | ICD-10-CM

## 2021-06-28 DIAGNOSIS — C50411 Malignant neoplasm of upper-outer quadrant of right female breast: Secondary | ICD-10-CM | POA: Diagnosis not present

## 2021-06-28 DIAGNOSIS — Z483 Aftercare following surgery for neoplasm: Secondary | ICD-10-CM

## 2021-06-28 NOTE — Therapy (Signed)
?OUTPATIENT PHYSICAL THERAPY TREATMENT NOTE ? ? ?Patient Name: Natasha Ortega ?MRN: [id number] ?DOB:1970-12-12, 51 y.o., female ?Today's Date: 06/28/2021 ? ?PCP: Louretta Shorten, MD ?REFERRING PROVIDER: Nicholas Lose, MD ? ?END OF SESSION:  ? PT End of Session - 06/28/21 1111   ? ? Visit Number 7   ? Number of Visits 10   ? Date for PT Re-Evaluation 07/04/21   ? PT Start Time 1106   ? PT Stop Time 1202   ? PT Time Calculation (min) 56 min   ? Activity Tolerance Patient tolerated treatment well   ? Behavior During Therapy [facility] for tasks assessed/performed   ? ?  ?  ? ?  ? ? ?Past Medical History:  ?Diagnosis Date  ? Cancer [facility])   ? breast  ? Headache   ? migraines  ? ?Past Surgical History:  ?Procedure Laterality Date  ? BREAST LUMPECTOMY WITH RADIOACTIVE SEED AND SENTINEL LYMPH NODE BIOPSY Right 05/11/2021  ? Procedure: RIGHT BREAST LUMPECTOMY WITH RADIOACTIVE SEED AND SENTINEL LYMPH NODE BIOPSY;  Surgeon: [name], MD;  Location: [place];  Service: General;  Laterality: Right;  ? PORTACATH PLACEMENT N/A 06/09/2021  ? Procedure: INSERTION PORT-A-CATH;  Surgeon: [name], MD;  Location: [place];  Service: General;  Laterality: N/A;  ? ?Patient Active Problem List  ? Diagnosis Date Noted  ? Port-A-Cath in place 06/21/2021  ? Genetic testing 05/09/2021  ? Family history of breast cancer 05/05/2021  ? Malignant neoplasm of upper-outer quadrant of right breast in female, estrogen receptor negative ([place]) 04/28/2021  ? ? ?REFERRING DIAG: Right breast cancer ? ?THERAPY DIAG:  ?Malignant neoplasm of upper-outer quadrant of right breast in female, estrogen receptor negative ([place]) ? ?Abnormal posture ? ?Aftercare following surgery for neoplasm ? ?PERTINENT HISTORY: Patient was diagnosed on 04/28/2021 with right grade 2 invasive ductal carcinoma breast cancer. She underwent a right lumpectomy and sentinel node biopsy on 05/11/2021. She had 1 node with micromets and 2 negative nodes (3 total) removed. It  is ER/PR negative and HER2 negative with a Ki67 of 40%.  ? ?PRECAUTIONS: Recent Surgery, right UE Lymphedema risk ? ?SUBJECTIVE: I feel really good today. I slept good last night and I feel okay since having my first chemo treatment yesterday.   ? ?PAIN:  ?Are you having pain? No ? ? ? ?OBJECTIVE:  ?  ?PATIENT SURVEYS:  ?QUICK DASH: ?  Katina Dung - 06/06/21 0001   ?  ?  Open a tight or new jar No difficulty   ?  Do heavy household chores (wash walls, wash floors) Mild difficulty   ?  Carry a shopping bag or briefcase No difficulty   ?  Wash your back Moderate difficulty   ?  Use a knife to cut food No difficulty   ?  Recreational activities in which you take some force or impact through your arm, shoulder, or hand (golf, hammering, tennis) Mild difficulty   ?  During the past week, to what extent has your arm, shoulder or hand problem interfered with your normal social activities with family, friends, neighbors, or groups? Slightly   ?  During the past week, to what extent has your arm, shoulder or hand problem limited your work or other regular daily activities Slightly   ?  Arm, shoulder, or hand pain. None   ?  Tingling (pins and needles) in your arm, shoulder, or hand None   ?  Difficulty Sleeping No difficulty   ?  DASH  Score 13.64 %   ?  ?   ?  ?  ?   ?  ?  ?  ?OBSERVATIONS: ?           Incision is well healed in her left lateral breast. Glue still present on incision. She has lateral breast cording (see photo).  ?  ?POSTURE:  ?Forward head and rounded shoulders posture ?  ?UPPER EXTREMITY AROM/PROM: ?  ?A/PROM RIGHT  05/05/2021 ?  RIGHT 06/06/2021  ?Shoulder extension 49 45  ?Shoulder flexion 152 119  ?Shoulder abduction 164 130  ?Shoulder internal rotation 63 53  ?Shoulder external rotation 88 78  ?                        (Blank rows = not tested) ?  ?A/PROM LEFT  05/05/2021  ?Shoulder extension 60  ?Shoulder flexion 149  ?Shoulder abduction 162  ?Shoulder internal rotation 61  ?Shoulder external rotation 89  ?                         (Blank rows = not tested) ?  ?  ? ?  ?CERVICAL AROM: ?All within normal limits ?  ?PALPATION: ?Patient is very tender to palpation in right lateral ribs and just inferior to her right breast along her rib. ?  ?UPPER EXTREMITY STRENGTH: WNL ?  ?  ?LYMPHEDEMA ASSESSMENTS:  ?  ?LANDMARK RIGHT  05/05/2021 RIGHT 06/06/2021  ?10 cm proximal to olecranon process 34.5 33.4  ?Olecranon process 26.9 26.8  ?10 cm proximal to ulnar styloid process 23.7 23.5  ?Just proximal to ulnar styloid process 16 15.9  ?Across hand at thumb web space 19.5 18.9  ?At base of 2nd digit 6.2 6.2  ?(Blank rows = not tested) ?  ?[place] LEFT  05/05/2021 LEFT 06/06/2021  ?10 cm proximal to olecranon process 34.2 34  ?Olecranon process 26 26.1  ?10 cm proximal to ulnar styloid process 22.8 23  ?Just proximal to ulnar styloid process 16.3 16.1  ?Across hand at thumb web space 19.5 18.9  ?At base of 2nd digit 6.2 6.1  ?(Blank rows = not tested) ?  ?Todays Treatment: ?06/28/21: ?Therapeutic Exercises ?Pulleys into abduction x1 min but mild stretch felt ?Ball roll up wall into flexion x10 ?Doorway pectoralis stretch 3 reps, x20 sec ?Manual Therapy: ?MFR: Lt S/L to Rt lateral breast cording which is much improved today and very mildly palpable ?STM: In Lt S/L over area of incision at and scar tissue surrounding same without ?  MLD: Short neck, 5 diaphragmatic breaths, Rt inguinal nodes, Rt axillo-inguinal anastomosis and focused on Rt lateral trunk at area of fullness, then     continued same in Lt S/L to work over lateral breast at area of cording then finished retracing all steps in supine beginning to instruct pt in this, having her return demo ? ?06/22/21: ?Therapeutic Exercises: ?Reviewed Supine Scapular Series with yellow theraband x5 each, pt able to return excellent technique only requiring VCs for slow, controlled pace with reps ?Manual Therapy: ?MFR: In Supine to Rt axilla at end motions, and then also in Lt S/L to Rt lateral  breast cording which is much improved today ?P/ROM: To Rt shoulder into flexion, abd and D2 to pts tolerance ?STM: In Lt S/L over area of incision at and scar tissue surrounding same without, then with cocoa butter ?  MLD: Short neck, 5 diaphragmatic breaths, Rt inguinal nodes, Rt axillo-inguinal anastomosis  and focused on Rt lateral trunk at area of fullness, then     continued same in Lt S/L to work over lateral breast at area of cording then finished retracing all steps in supine and did MLD intermittently during other manual   therapies today ? ?06/20/21: ?Therapeutic Exercises: ?Supine Scapular Series with yellow theraband x5-7 each, returning therapist demo for each and handout issued ?Manual Therapy: ?MFR: In Supine to Rt axilla and lateral breast at areas of cording, and then also in Lt S/L to Rt lateral breast ?P/ROM: To Rt shoulder into flexion, abd and D2 to pts tolerance ?STM: In Lt S/L to Rt lateral trunk ?MLD: Rt inguinal nodes, Rt axillo-inguinal anastomosis and focused on Rt lateral trunk at area of fullness ? ?06/16/21:  ?Therapeutic Exercises: ?Pulleys into flexion x1 min and abd x2 mins returning therapist demo  ?Ball roll up wall into flex x10 and then Rt UE abd x5 with lean into end of stretches and returning therapist demo ?Manual Therapy: ?MFR: In Supine to Rt axilla  ?P/ROM: To Rt shoulder into flexion, abd and D2 to pts tolerance ?STM: In Lt S/L to Rt periscapular area and lateral trunk with cocoa butter, mild pressure as pt is tender to touch at lateral trunk ? ?  ?  ?PATIENT EDUCATION:  ?Education details: HEP and lymphedema education ?Person educated: Patient ?Education method: Explanation, Demonstration, and Handouts ?Education comprehension: verbalized understanding and returned demonstration ?  ?  ?HOME EXERCISE PROGRAM: ?           Reviewed previously given post op HEP. Issued closed chain HEP. ?  ?  ?ASSESSMENT: ?  ?CLINICAL IMPRESSION: ?Continued MLD to Rt breast and instructed pt in  this as it will be beneficial for her to do this during radiation. Small area of scar tissue palpable inferior to incision today with small area of peau de l'orange, showed pt this with mirror so focused

## 2021-06-28 NOTE — Patient Instructions (Signed)
Self manual lymph drainage: ?Perform this sequence once a day.  Only give enough pressure to your skin to make the skin move. ? ?Start with circles near neck 5 times each side. ? ?Diaphragmatic - Supine ? ? ?Inhale through nose making navel move out toward hands. Exhale through puckered lips, hands follow navel in. ?Repeat _5__ times. Rest _10__ seconds between repeats.  ? ?Hug yourself.  Do circles at your neck just above your collarbones.  Repeat this 10 times. ? ?Axilla - One at a Time ? ? ?Using full weight of flat hand and fingers at center of uninvolved armpit, make _10__ in-place circles.  ? ?Copyright ? [employer]. All rights reserved.  ?LEG: Inguinal Nodes Stimulation ? ? ?With small finger side of hand against hip crease on involved side, gently perform circles at the crease. ?Repeat __10_ times.  ? ?Copyright ? V[employer]. All rights reserved.  ?Axilla to Inguinal Nodes - Sweep ? ? ?On involved side, pump _4__ times from armpit along side of trunk to hip crease. ? ?Now gently stretch skin from the involved side to the uninvolved side across the chest at the shoulder line.  Repeat that 4 times. ? ?Draw an imaginary diagonal line from upper outer breast through the nipple area toward lower inner breast.  Direct fluid upward and inward from this line toward the pathway across your upper chest .  Do this in three rows to treat all of the upper inner breast tissue, and do each row 3-4x. ?     Direct fluid to treat all of lower outer breast tissue downward and outward toward  pathway that is aimed at the right groin. ? ?Finish by doing the pathways as described above going from your involved armpit to the same side groin and going across your upper chest from the involved shoulder to the uninvolved shoulder. ? ?Repeat the steps above where you do circles in your right groin and left armpit. ?Copyright ? [employer]. All rights reserved ? ?Cancer Rehab [phone] ?

## 2021-06-29 ENCOUNTER — Inpatient Hospital Stay: Payer: BC Managed Care – PPO

## 2021-06-29 ENCOUNTER — Other Ambulatory Visit: Payer: Self-pay

## 2021-06-29 ENCOUNTER — Encounter: Payer: Self-pay | Admitting: *Deleted

## 2021-06-29 ENCOUNTER — Inpatient Hospital Stay (HOSPITAL_BASED_OUTPATIENT_CLINIC_OR_DEPARTMENT_OTHER): Payer: BC Managed Care – PPO | Admitting: Hematology and Oncology

## 2021-06-29 ENCOUNTER — Other Ambulatory Visit: Payer: Self-pay | Admitting: *Deleted

## 2021-06-29 DIAGNOSIS — C50411 Malignant neoplasm of upper-outer quadrant of right female breast: Secondary | ICD-10-CM | POA: Diagnosis not present

## 2021-06-29 DIAGNOSIS — Z171 Estrogen receptor negative status [ER-]: Secondary | ICD-10-CM

## 2021-06-29 DIAGNOSIS — Z95828 Presence of other vascular implants and grafts: Secondary | ICD-10-CM

## 2021-06-29 DIAGNOSIS — Z5111 Encounter for antineoplastic chemotherapy: Secondary | ICD-10-CM | POA: Diagnosis not present

## 2021-06-29 LAB — CBC WITH DIFFERENTIAL (CANCER CENTER ONLY)
Abs Immature Granulocytes: 3.33 10*3/uL — ABNORMAL HIGH (ref 0.00–0.07)
Basophils Absolute: 0.1 10*3/uL (ref 0.0–0.1)
Basophils Relative: 0 %
Eosinophils Absolute: 0.2 10*3/uL (ref 0.0–0.5)
Eosinophils Relative: 1 %
HCT: 37.5 % (ref 36.0–46.0)
Hemoglobin: 13.1 g/dL (ref 12.0–15.0)
Immature Granulocytes: 11 %
Lymphocytes Relative: 12 %
Lymphs Abs: 3.7 10*3/uL (ref 0.7–4.0)
MCH: 30.5 pg (ref 26.0–34.0)
MCHC: 34.9 g/dL (ref 30.0–36.0)
MCV: 87.4 fL (ref 80.0–100.0)
Monocytes Absolute: 6.7 10*3/uL — ABNORMAL HIGH (ref 0.1–1.0)
Monocytes Relative: 22 %
Neutro Abs: 16 10*3/uL — ABNORMAL HIGH (ref 1.7–7.7)
Neutrophils Relative %: 54 %
Platelet Count: 291 10*3/uL (ref 150–400)
RBC: 4.29 MIL/uL (ref 3.87–5.11)
RDW: 12.4 % (ref 11.5–15.5)
Smear Review: NORMAL
WBC Count: 30 10*3/uL — ABNORMAL HIGH (ref 4.0–10.5)
nRBC: 0.6 % — ABNORMAL HIGH (ref 0.0–0.2)

## 2021-06-29 LAB — CMP (CANCER CENTER ONLY)
ALT: 28 U/L (ref 0–44)
AST: 41 U/L (ref 15–41)
Albumin: 4 g/dL (ref 3.5–5.0)
Alkaline Phosphatase: 78 U/L (ref 38–126)
Anion gap: 10 (ref 5–15)
BUN: 10 mg/dL (ref 6–20)
CO2: 23 mmol/L (ref 22–32)
Calcium: 9.2 mg/dL (ref 8.9–10.3)
Chloride: 103 mmol/L (ref 98–111)
Creatinine: 0.99 mg/dL (ref 0.44–1.00)
GFR, Estimated: >60 mL/min (ref >60)
Glucose, Bld: 109 mg/dL — ABNORMAL HIGH (ref 70–99)
Potassium: 4.1 mmol/L (ref 3.5–5.1)
Sodium: 136 mmol/L (ref 135–145)
Total Bilirubin: 0.5 mg/dL (ref 0.3–1.2)
Total Protein: 6.9 g/dL (ref 6.5–8.1)

## 2021-06-29 MED ORDER — FLUCONAZOLE 100 MG PO TABS: 100.0000 mg | ORAL_TABLET | Freq: Every day | ORAL | 0 refills | Status: DC

## 2021-06-29 MED ORDER — SODIUM CHLORIDE 0.9% FLUSH
10.0000 mL | Freq: Once | INTRAVENOUS | Status: AC
  Administered 2021-06-29: 10 mL

## 2021-06-29 MED ORDER — HEPARIN SOD (PORK) LOCK FLUSH 100 UNIT/ML IV SOLN
500.0000 [IU] | Freq: Once | INTRAVENOUS | Status: AC
  Administered 2021-06-29: 500 [IU]

## 2021-06-29 NOTE — Assessment & Plan Note (Signed)
04/21/2021:Screening mammogram detected right breast mass 6 mm by ultrasound, axilla negative, biopsy: Grade 2 IDC ER 0%, PR 0%, HER2 2+ by IHC ?05/11/2021:Right lumpectomy: Grade 2 IDC 0.9 cm, margins negative, angiolymphatic invasion present, 1 lymph node with micrometastases, 0/2 other lymph nodes negative, ER 0%, PR 0%, HER2 2+ by IHC and FISH negative with a ratio 1.33 Ki-67 25% ?? ?Treatment plan: ?1.??Adjuvant chemotherapy with Taxotere Cytoxan every 3 weeks x4 ?2.??Adjuvant radiation ?------------------------------------------------------------------------------------------------------------------- ?Current treatment: Cycle 1 day 8 Taxotere and Cytoxan ?Chemotoxicities: ? ? ?Return to clinic in 2 weeks for cycle 2 ?

## 2021-06-30 ENCOUNTER — Ambulatory Visit: Payer: BC Managed Care – PPO

## 2021-06-30 DIAGNOSIS — C50411 Malignant neoplasm of upper-outer quadrant of right female breast: Secondary | ICD-10-CM | POA: Diagnosis not present

## 2021-06-30 DIAGNOSIS — Z483 Aftercare following surgery for neoplasm: Secondary | ICD-10-CM

## 2021-06-30 DIAGNOSIS — R293 Abnormal posture: Secondary | ICD-10-CM

## 2021-06-30 NOTE — Therapy (Signed)
?OUTPATIENT PHYSICAL THERAPY TREATMENT NOTE ? ? ?Patient Name: Natasha Ortega ?MRN: [id number] ?DOB:[birth year], 51 y.o., female ?Today's Date: 06/30/2021 ? ?PCP: Louretta Shorten, MD ?REFERRING PROVIDER: Nicholas Lose, MD ? ?END OF SESSION:  ? PT End of Session - 06/30/21 1105   ? ? Visit Number 8   ? Number of Visits 10   ? Date for PT Re-Evaluation 07/04/21   ? PT Start Time 1104   ? PT Stop Time 3354   ? PT Time Calculation (min) 55 min   ? Activity Tolerance Patient tolerated treatment well   ? Behavior During Therapy [facility] for tasks assessed/performed   ? ?  ?  ? ?  ? ? ?Past Medical History:  ?Diagnosis Date  ? Cancer [facility])   ? breast  ? Headache   ? migraines  ? ?Past Surgical History:  ?Procedure Laterality Date  ? BREAST LUMPECTOMY WITH RADIOACTIVE SEED AND SENTINEL LYMPH NODE BIOPSY Right 05/11/2021  ? Procedure: RIGHT BREAST LUMPECTOMY WITH RADIOACTIVE SEED AND SENTINEL LYMPH NODE BIOPSY;  Surgeon: [name], MD;  Location: [place];  Service: General;  Laterality: Right;  ? PORTACATH PLACEMENT N/A 06/09/2021  ? Procedure: INSERTION PORT-A-CATH;  Surgeon: [name], MD;  Location: [place];  Service: General;  Laterality: N/A;  ? ?Patient Active Problem List  ? Diagnosis Date Noted  ? Port-A-Cath in place 06/21/2021  ? Genetic testing 05/09/2021  ? Family history of breast cancer 05/05/2021  ? Malignant neoplasm of upper-outer quadrant of right breast in female, estrogen receptor negative ([place]) 04/28/2021  ? ? ?REFERRING DIAG: Right breast cancer ? ?THERAPY DIAG:  ?Malignant neoplasm of upper-outer quadrant of right breast in female, estrogen receptor negative ([place]) ? ?Abnormal posture ? ?Aftercare following surgery for neoplasm ? ?PERTINENT HISTORY: Patient was diagnosed on 04/28/2021 with right grade 2 invasive ductal carcinoma breast cancer. She underwent a right lumpectomy and sentinel node biopsy on 05/11/2021. She had 1 node with micromets and 2 negative nodes (3 total) removed. It  is ER/PR negative and HER2 negative with a Ki67 of 40%.  ? ?PRECAUTIONS: Recent Surgery, right UE Lymphedema risk ? ?SUBJECTIVE: I had a low grade fever the last few days. I know I just pushed myself too long. 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     ? ?PAIN:  ?Are you having pain? No ? ? ? ?OBJECTIVE:  ?  ?PATIENT SURVEYS:  ?QUICK DASH: ?  Katina Dung - 06/06/21 0001   ?  ?  Open a tight or new jar No difficulty   ?  Do heavy household chores (wash walls, wash floors) Mild difficulty   ?  Carry a shopping bag or briefcase No difficulty   ?  Wash your back Moderate difficulty   ?  Use a knife to cut food No difficulty   ?  Recreational activities in which you take some force or impact through your arm, shoulder, or hand (golf, hammering, tennis) Mild difficulty   ?  During the past week, to what extent has your arm, shoulder or hand problem interfered with your normal social activities with family, friends, neighbors, or groups? Slightly   ?  During the past week, to what extent has your arm, shoulder or hand problem limited your work or other regular daily activities Slightly   ?  Arm, shoulder, or hand pain. None   ?  Tingling (pins and needles) in your arm, shoulder, or hand None   ?  Difficulty Sleeping No difficulty   ?  DASH Score 13.64 %   ?  ?   ?  ?  ?   ?  ?  ?  ?OBSERVATIONS: ?           Incision is well healed in her left lateral breast. Glue still present on incision. She has lateral breast cording (see photo).  ?  ?POSTURE:  ?Forward head and rounded shoulders posture ?  ?UPPER EXTREMITY AROM/PROM: ?  ?A/PROM RIGHT  05/05/2021 ?  RIGHT 06/06/2021  ?Shoulder extension 49 45  ?Shoulder flexion 152 119  ?Shoulder abduction 164 130  ?Shoulder internal rotation 63 53  ?Shoulder external rotation 88 78  ?                        (Blank rows = not tested) ?  ?A/PROM LEFT  05/05/2021  ?Shoulder extension 60  ?Shoulder flexion 149  ?Shoulder abduction 162  ?Shoulder internal rotation 61   ?Shoulder external rotation 89  ?                        (Blank rows = not tested) ?  ?  ? ?  ?CERVICAL AROM: ?All within normal limits ?  ?PALPATION: ?Patient is very tender to palpation in right lateral ribs and just inferior to her right breast along her rib. ?  ?UPPER EXTREMITY STRENGTH: WNL ?  ?  ?LYMPHEDEMA ASSESSMENTS:  ?  ?LANDMARK RIGHT  05/05/2021 RIGHT 06/06/2021  ?10 cm proximal to olecranon process 34.5 33.4  ?Olecranon process 26.9 26.8  ?10 cm proximal to ulnar styloid process 23.7 23.5  ?Just proximal to ulnar styloid process 16 15.9  ?Across hand at thumb web space 19.5 18.9  ?At base of 2nd digit 6.2 6.2  ?(Blank rows = not tested) ?  ?[place] LEFT  05/05/2021 LEFT 06/06/2021  ?10 cm proximal to olecranon process 34.2 34  ?Olecranon process 26 26.1  ?10 cm proximal to ulnar styloid process 22.8 23  ?Just proximal to ulnar styloid process 16.3 16.1  ?Across hand at thumb web space 19.5 18.9  ?At base of 2nd digit 6.2 6.1  ?(Blank rows = not tested) ?  ?Todays Treatment: ?06/30/21: ?Manual Therapy: ?MFR: Lt S/L to Rt lateral breast cording which is much improved today and very mildly palpable ?STM: In Lt S/L over area of incision at and scar tissue surrounding same without ?  MLD: Short neck, 5 diaphragmatic breaths, Rt inguinal nodes, Rt axillo-inguinal anastomosis and focused on Rt lateral trunk at area of fullness, then     continued same in Lt S/L to work over lateral breast at area of cording then finished retracing all steps in supine beginning to instruct pt in this, having her return demo ? ?06/28/21: ?Therapeutic Exercises ?Pulleys into abduction x1 min but mild stretch felt ?Ball roll up wall into flexion x10 ?Doorway pectoralis stretch 3 reps, x20 sec ?Manual Therapy: ?MFR: Lt S/L to Rt lateral breast cording which is much improved today and very mildly palpable ?STM: In Lt S/L over area of incision at and scar tissue surrounding same without ?  MLD: Short neck, 5 diaphragmatic breaths, Rt  inguinal nodes, Rt axillo-inguinal anastomosis and focused on Rt lateral trunk at area of fullness, then     continued same in Lt S/L to work over lateral breast at area of cording then finished retracing all steps in supine beginning to instruct pt in  this, having her return demo ? ?06/22/21: ?Therapeutic Exercises: ?Reviewed Supine Scapular Series with yellow theraband x5 each, pt able to return excellent technique only requiring VCs for slow, controlled pace with reps ?Manual Therapy: ?MFR: In Supine to Rt axilla at end motions, and then also in Lt S/L to Rt lateral breast cording which is much improved today ?P/ROM: To Rt shoulder into flexion, abd and D2 to pts tolerance ?STM: In Lt S/L over area of incision at and scar tissue surrounding same without, then with cocoa butter ?  MLD: Short neck, 5 diaphragmatic breaths, Rt inguinal nodes, Rt axillo-inguinal anastomosis and focused on Rt lateral trunk at area of fullness, then     continued same in Lt S/L to work over lateral breast at area of cording then finished retracing all steps in supine and did MLD intermittently during other manual   therapies today ? ? ?  ?  ?PATIENT EDUCATION:  ?Education details: HEP and lymphedema education ?Person educated: Patient ?Education method: Explanation, Demonstration, and Handouts ?Education comprehension: verbalized understanding and returned demonstration ?  ?  ?HOME EXERCISE PROGRAM: ?           Reviewed previously given post op HEP. Issued closed chain HEP. ?  ?  ?ASSESSMENT: ?  ?CLINICAL IMPRESSION: ?Continued MLD to Rt breast with MFR and STM to scr tissue at incision and area where cording was present. Cording is much improved and very mildly visible now. Pt also reports tightness with OH reaching is much improved.  ?  ?Pt will benefit from skilled therapeutic intervention to improve on the following deficits: Decreased knowledge of precautions, impaired UE functional use, pain, decreased ROM, postural dysfunction.  ?   ?PT treatment/interventions: ADL/Self care home management, Therapeutic exercises, Therapeutic activity, Neuromuscular re-education, Balance training, Gait training, Patient/Family education, Joint mobil

## 2021-07-01 ENCOUNTER — Telehealth: Payer: Self-pay

## 2021-07-01 NOTE — Telephone Encounter (Signed)
Return call to pt, she reports mild vaginal spotting and cramping in lower abdomen for 1 day, 5/11.  I consulted with Val RN and informed pt that this can sometimes happen, likely due to treatment and her over exerting herself the days prior.  Advised pt to call us if this occurs again.  ?

## 2021-07-04 ENCOUNTER — Ambulatory Visit: Payer: BC Managed Care – PPO

## 2021-07-04 DIAGNOSIS — R293 Abnormal posture: Secondary | ICD-10-CM

## 2021-07-04 DIAGNOSIS — Z171 Estrogen receptor negative status [ER-]: Secondary | ICD-10-CM

## 2021-07-04 DIAGNOSIS — C50411 Malignant neoplasm of upper-outer quadrant of right female breast: Secondary | ICD-10-CM | POA: Diagnosis not present

## 2021-07-04 DIAGNOSIS — Z483 Aftercare following surgery for neoplasm: Secondary | ICD-10-CM

## 2021-07-04 NOTE — Progress Notes (Signed)
Patient Care Team: Candice Camp, MD as PCP - General (Obstetrics and Gynecology)  DIAGNOSIS:  Encounter Diagnosis  Name Primary?   Malignant neoplasm of upper-outer quadrant of right breast in female, estrogen receptor negative (HCC)     SUMMARY OF ONCOLOGIC HISTORY: Oncology History  Malignant neoplasm of upper-outer quadrant of right breast in female, estrogen receptor negative (HCC)  04/21/2021 Initial Diagnosis   Screening mammogram detected right breast mass 6 mm by ultrasound, axilla negative, biopsy: Grade 2 IDC ER 0%, PR 0%, HER2 2+ by [facility]   05/11/2021 Surgery   Right lumpectomy: Grade 2 IDC 0.9 cm, margins negative, angiolymphatic invasion present, 1 lymph node with micrometastases, 0/2 other lymph nodes negative, ER 0%, PR 0%, HER2 negative by FISH Ki-67 25% (final pathology is negative for HER2)   06/22/2021 -  Chemotherapy   Patient is on Treatment Plan : BREAST TC q21d       Genetic Testing   [facility] CustomNext Panel was Negative. Report date is 07/05/2021.  The CustomNext-Cancer+RNAinsight panel offered by [facility] includes sequencing and rearrangement analysis for the following 47 genes:  APC, ATM, AXIN2, BARD1, BMPR1A, BRCA1, BRCA2, BRIP1, CDH1, CDK4, CDKN2A, CHEK2, CTNNA1, DICER1, EPCAM, GREM1, HOXB13, KIT, MEN1, MLH1, MSH2, MSH3, MSH6, MUTYH, NBN, NF1, NTHL1, PALB2, PDGFRA, PMS2, POLD1, POLE, PTEN, RAD50, RAD51C, RAD51D, SDHA, SDHB, SDHC, SDHD, SMAD4, SMARCA4, STK11, TP53, TSC1, TSC2, and VHL.  RNA data is routinely analyzed for use in variant interpretation for all genes.     CHIEF COMPLIANT: Cycle 2 Docetaxel Cyclophosphamide  INTERVAL HISTORY: Natasha Ortega is a 51 y.o. female is here because of recent diagnosis of right-sided breast cancer on Docetaxel  Cyclophosphamide She presents to the clinic today for a follow-up. She states that her menstrual cycle has been coming on and off in the last past week. States that it was only one day. She states that she  takes Claritin for the bone pain. She states that the diarrhea is under control.   ALLERGIES:  is allergic to penicillins.  MEDICATIONS:  Current Outpatient Medications  Medication Sig Dispense Refill   dexamethasone (DECADRON) 4 MG tablet Take 1 tablet (4 mg total) by mouth 2 (two) times daily. Take 1 tablet day before chemo and 1 tablet day after chemo with food 8 tablet 0   fluconazole (DIFLUCAN) 100 MG tablet Take 1 tablet (100 mg total) by mouth daily. 10 tablet 0   ibuprofen (ADVIL) 200 MG tablet Take by mouth.     lidocaine-prilocaine (EMLA) cream Apply to affected area once 30 g 3   Multiple Vitamins-Minerals (MULTIVITAMIN WITH MINERALS) tablet Take 1 tablet by mouth daily.     ondansetron (ZOFRAN) 8 MG tablet Take 1 tablet (8 mg total) by mouth 2 (two) times daily as needed for refractory nausea / vomiting. Start on day 3 after chemo. 30 tablet 1   prochlorperazine (COMPAZINE) 10 MG tablet Take 1 tablet (10 mg total) by mouth every 6 (six) hours as needed (Nausea or vomiting). 30 tablet 1   traMADol (ULTRAM) 50 MG tablet Take 1 tablet (50 mg total) by mouth every 6 (six) hours as needed for moderate pain. 25 tablet 0   No current facility-administered medications for this visit.    PHYSICAL EXAMINATION: ECOG PERFORMANCE STATUS: 1 - Symptomatic but completely ambulatory  Vitals:   07/13/21 1347  BP: 105/74  Pulse: 82  Resp: 18  Temp: (!) 97.3 F (36.3 C)  SpO2: 97%   Filed Weights   07/13/21  1347  Weight: 191 lb 4.8 oz (86.8 kg)      LABORATORY DATA:  I have reviewed the data as listed    Latest Ref Rng & Units 06/29/2021    8:31 AM 06/22/2021   10:37 AM  CMP  Glucose 70 - 99 mg/dL 109   127    BUN 6 - 20 mg/dL 10   16    Creatinine 0.44 - 1.00 mg/dL 0.99   1.20    Sodium 135 - 145 mmol/L 136   135    Potassium 3.5 - 5.1 mmol/L 4.1   4.1    Chloride 98 - 111 mmol/L 103   103    CO2 22 - 32 mmol/L 23   23    Calcium 8.9 - 10.3 mg/dL 9.2   9.4    Total Protein  6.5 - 8.1 g/dL 6.9   7.2    Total Bilirubin 0.3 - 1.2 mg/dL 0.5   0.5    Alkaline Phos 38 - 126 U/L 78   55    AST 15 - 41 U/L 41   18    ALT 0 - 44 U/L 28   17      Lab Results  Component Value Date   WBC 9.4 07/13/2021   HGB 12.1 07/13/2021   HCT 34.8 (L) 07/13/2021   MCV 89.0 07/13/2021   PLT 532 (H) 07/13/2021   NEUTROABS 5.5 07/13/2021    ASSESSMENT & PLAN:  Malignant neoplasm of upper-outer quadrant of right breast in female, estrogen receptor negative ([place]) 04/21/2021:Screening mammogram detected right breast mass 6 mm by ultrasound, axilla negative, biopsy: Grade 2 IDC ER 0%, PR 0%, HER2 2+ by IHC 05/11/2021:Right lumpectomy: Grade 2 IDC 0.9 cm, margins negative, angiolymphatic invasion present, 1 lymph node with micrometastases, 0/2 other lymph nodes negative, ER 0%, PR 0%, HER2 2+ by IHC and FISH negative with a ratio 1.33 Ki-67 25%   Treatment plan: 1.  Adjuvant chemotherapy with Taxotere Cytoxan every 3 weeks x4 2.  Adjuvant radiation ------------------------------------------------------------------------------------------------------------------- Current treatment: Cycle 2 Taxotere and Cytoxan Chemotoxicities: Mild nausea: Resolved with antinausea medication Mild diarrhea: Resolved with Imodium Monitoring closely for neuropathy   Labs have been reviewed.  Patient did extremely well and she will receive the same dosage for cycle 2.   Return to clinic in 3 weeks for cycle 3    No orders of the defined types were placed in this encounter.  The patient has a good understanding of the overall plan. she agrees with it. she will call with any problems that may develop before the next visit here. Total time spent: 30 mins including face to face time and time spent for planning, charting and co-ordination of care   Harriette Ohara, MD 07/13/21    I Gardiner Coins am scribing for Dr. Lindi Adie  I have reviewed the above documentation for accuracy and completeness, and  I agree with the above.

## 2021-07-04 NOTE — Therapy (Signed)
?OUTPATIENT PHYSICAL THERAPY TREATMENT NOTE ? ? ?Patient Name: Natasha Ortega ?MRN: [id number] ?DOB:Jul 04, 1970, 51 y.o., female ?Today's Date: 07/04/2021 ? ?PCP: Louretta Shorten, MD ?REFERRING PROVIDER: Nicholas Lose, MD ? ?END OF SESSION:  ? PT End of Session - 07/04/21 1405   ? ? Visit Number 9   ? Number of Visits 10   ? Date for PT Re-Evaluation 07/04/21   ? PT Start Time 4098   ? PT Stop Time 1191   ? PT Time Calculation (min) 56 min   ? Activity Tolerance Patient tolerated treatment well   ? Behavior During Therapy [facility] for tasks assessed/performed   ? ?  ?  ? ?  ? ? ?Past Medical History:  ?Diagnosis Date  ? Cancer [facility])   ? breast  ? Headache   ? migraines  ? ?Past Surgical History:  ?Procedure Laterality Date  ? BREAST LUMPECTOMY WITH RADIOACTIVE SEED AND SENTINEL LYMPH NODE BIOPSY Right 05/11/2021  ? Procedure: RIGHT BREAST LUMPECTOMY WITH RADIOACTIVE SEED AND SENTINEL LYMPH NODE BIOPSY;  Surgeon: [name], MD;  Location: [place];  Service: General;  Laterality: Right;  ? PORTACATH PLACEMENT N/A 06/09/2021  ? Procedure: INSERTION PORT-A-CATH;  Surgeon: [name], MD;  Location: [place];  Service: General;  Laterality: N/A;  ? ?Patient Active Problem List  ? Diagnosis Date Noted  ? Port-A-Cath in place 06/21/2021  ? Genetic testing 05/09/2021  ? Family history of breast cancer 05/05/2021  ? Malignant neoplasm of upper-outer quadrant of right breast in female, estrogen receptor negative ([place]) 04/28/2021  ? ? ?REFERRING DIAG: Right breast cancer ? ?THERAPY DIAG:  ?Malignant neoplasm of upper-outer quadrant of right breast in female, estrogen receptor negative ([place]) ? ?Abnormal posture ? ?Aftercare following surgery for neoplasm ? ?PERTINENT HISTORY: Patient was diagnosed on 04/28/2021 with right grade 2 invasive ductal carcinoma breast cancer. She underwent a right lumpectomy and sentinel node biopsy on 05/11/2021. She had 1 node with micromets and 2 negative nodes (3 total) removed. It  is ER/PR negative and HER2 negative with a Ki67 of 40%.  ? ?PRECAUTIONS: Recent Surgery, right UE Lymphedema risk ? ?SUBJECTIVE: I feel better than I did last time. But I did notice that my hair really started falling out yesterday/last night.   The cording In my breast is so much better than it was and I don't feel the tightness I used to fele when I reach [facility].    ? ?PAIN:  ?Are you having pain? No ? ? ? ?OBJECTIVE:  ?  ?PATIENT SURVEYS:  ?QUICK DASH: ?  Katina Dung - 06/06/21 0001   ?  ?  Open a tight or new jar No difficulty   ?  Do heavy household chores (wash walls, wash floors) Mild difficulty   ?  Carry a shopping bag or briefcase No difficulty   ?  Wash your back Moderate difficulty   ?  Use a knife to cut food No difficulty   ?  Recreational activities in which you take some force or impact through your arm, shoulder, or hand (golf, hammering, tennis) Mild difficulty   ?  During the past week, to what extent has your arm, shoulder or hand problem interfered with your normal social activities with family, friends, neighbors, or groups? Slightly   ?  During the past week, to what extent has your arm, shoulder or hand problem limited your work or other regular daily activities Slightly   ?  Arm, shoulder, or hand pain.  None   ?  Tingling (pins and needles) in your arm, shoulder, or hand None   ?  Difficulty Sleeping No difficulty   ?  DASH Score 13.64 %   ?  ?   ?  ?  ?   ?  ?  ?  ?OBSERVATIONS: ?           Incision is well healed in her left lateral breast. Glue still present on incision. She has lateral breast cording (see photo).  ?  ?POSTURE:  ?Forward head and rounded shoulders posture ?  ?UPPER EXTREMITY AROM/PROM: ?  ?A/PROM RIGHT  05/05/2021 ?  RIGHT 06/06/2021  ?Shoulder extension 49 45  ?Shoulder flexion 152 119  ?Shoulder abduction 164 130  ?Shoulder internal rotation 63 53  ?Shoulder external rotation 88 78  ?                        (Blank rows = not tested) ?  ?A/PROM LEFT  05/05/2021  ?Shoulder  extension 60  ?Shoulder flexion 149  ?Shoulder abduction 162  ?Shoulder internal rotation 61  ?Shoulder external rotation 89  ?                        (Blank rows = not tested) ?  ?  ? ?  ?CERVICAL AROM: ?All within normal limits ?  ?PALPATION: ?Patient is very tender to palpation in right lateral ribs and just inferior to her right breast along her rib. ?  ?UPPER EXTREMITY STRENGTH: WNL ?  ?  ?LYMPHEDEMA ASSESSMENTS:  ?  ?LANDMARK RIGHT  05/05/2021 RIGHT 06/06/2021  ?10 cm proximal to olecranon process 34.5 33.4  ?Olecranon process 26.9 26.8  ?10 cm proximal to ulnar styloid process 23.7 23.5  ?Just proximal to ulnar styloid process 16 15.9  ?Across hand at thumb web space 19.5 18.9  ?At base of 2nd digit 6.2 6.2  ?(Blank rows = not tested) ?  ?[place] LEFT  05/05/2021 LEFT 06/06/2021  ?10 cm proximal to olecranon process 34.2 34  ?Olecranon process 26 26.1  ?10 cm proximal to ulnar styloid process 22.8 23  ?Just proximal to ulnar styloid process 16.3 16.1  ?Across hand at thumb web space 19.5 18.9  ?At base of 2nd digit 6.2 6.1  ?(Blank rows = not tested) ?  ?Todays Treatment: ? ?07/04/21: ?Manual Therapy: ?MFR: Lt S/L to Rt lateral breast cording which is much improved today and very mildly palpable ?STM: In Lt S/L over area of incision at and scar tissue surrounding same without ?  MLD: Short neck, 5 diaphragmatic breaths, Rt inguinal nodes, Rt axillo-inguinal anastomosis and focused on Rt lateral trunk at area of fullness, then     continued same in Lt S/L to work over lateral breast at area of cording then finished retracing all steps in supine reviewing technique with pt while performing ? ?06/30/21: ?Manual Therapy: ?MFR: Lt S/L to Rt lateral breast cording which is much improved today and very mildly palpable ?STM: In Lt S/L over area of incision at and scar tissue surrounding same without ?  MLD: Short neck, 5 diaphragmatic breaths, Rt inguinal nodes, Rt axillo-inguinal anastomosis and focused on Rt lateral  trunk at area of fullness, then     continued same in Lt S/L to work over lateral breast at area of cording then finished retracing all steps in supine beginning to instruct pt in this, having her return demo ? ?06/28/21: ?Therapeutic  Exercises ?Pulleys into abduction x1 min but mild stretch felt ?Ball roll up wall into flexion x10 ?Doorway pectoralis stretch 3 reps, x20 sec ?Manual Therapy: ?MFR: Lt S/L to Rt lateral breast cording which is much improved today and very mildly palpable ?STM: In Lt S/L over area of incision at and scar tissue surrounding same without ?  MLD: Short neck, 5 diaphragmatic breaths, Rt inguinal nodes, Rt axillo-inguinal anastomosis and focused on Rt lateral trunk at area of fullness, then     continued same in Lt S/L to work over lateral breast at area of cording then finished retracing all steps in supine beginning to instruct pt in this, having her return demo ? ? ?  ?  ?PATIENT EDUCATION:  ?Education details: HEP and lymphedema education ?Person educated: Patient ?Education method: Explanation, Demonstration, and Handouts ?Education comprehension: verbalized understanding and returned demonstration ?  ?  ?HOME EXERCISE PROGRAM: ?           Reviewed previously given post op HEP. Issued closed chain HEP. ?  ?  ?ASSESSMENT: ?  ?CLINICAL IMPRESSION: ?Continued MLD to Rt breast with MFR and STM to scr tissue at incision and area where cording was present. Cording conts to be much improved by only being slightly palpable now. Pt will be ready for D/C at next session. ?  ?Pt will benefit from skilled therapeutic intervention to improve on the following deficits: Decreased knowledge of precautions, impaired UE functional use, pain, decreased ROM, postural dysfunction.  ?  ?PT treatment/interventions: ADL/Self care home management, Therapeutic exercises, Therapeutic activity, Neuromuscular re-education, Balance training, Gait training, Patient/Family education, Joint mobilization, Manual lymph  drainage, scar mobilization, and Manual therapy ?  ?  ?  ?  ?GOALS: ?Goals reviewed with patient? Yes ?  ?LONG TERM GOALS:  (STG=LTG) ?  ?GOALS Name Target Date Goal status  ?1 Pt will demonstrate she has regain

## 2021-07-06 ENCOUNTER — Ambulatory Visit: Payer: BC Managed Care – PPO

## 2021-07-06 DIAGNOSIS — R293 Abnormal posture: Secondary | ICD-10-CM

## 2021-07-06 DIAGNOSIS — Z483 Aftercare following surgery for neoplasm: Secondary | ICD-10-CM

## 2021-07-06 DIAGNOSIS — C50411 Malignant neoplasm of upper-outer quadrant of right female breast: Secondary | ICD-10-CM | POA: Diagnosis not present

## 2021-07-06 DIAGNOSIS — Z171 Estrogen receptor negative status [ER-]: Secondary | ICD-10-CM

## 2021-07-06 NOTE — Therapy (Addendum)
?OUTPATIENT PHYSICAL THERAPY TREATMENT NOTE ? ? ?Patient Name: Natasha Ortega ?MRN: [id number] ?DOB:[birth year], 51 y.o., female ?Today's Date: 07/06/2021 ? ?PCP: Louretta Shorten, MD ?REFERRING PROVIDER: Nicholas Lose, MD ? ?END OF SESSION:  ? PT End of Session - 07/06/21 0909   ? ? Visit Number 10   ? Number of Visits 10   ? Date for PT Re-Evaluation 07/04/21   D/C this visit  ? PT Start Time [phone]   ? PT Stop Time 1000   ? PT Time Calculation (min) 54 min   ? Activity Tolerance Patient tolerated treatment well   ? Behavior During Therapy [facility] for tasks assessed/performed   ? ?  ?  ? ?  ? ? ?Past Medical History:  ?Diagnosis Date  ? Cancer [facility])   ? breast  ? Headache   ? migraines  ? ?Past Surgical History:  ?Procedure Laterality Date  ? BREAST LUMPECTOMY WITH RADIOACTIVE SEED AND SENTINEL LYMPH NODE BIOPSY Right 05/11/2021  ? Procedure: RIGHT BREAST LUMPECTOMY WITH RADIOACTIVE SEED AND SENTINEL LYMPH NODE BIOPSY;  Surgeon: [name], MD;  Location: [place];  Service: General;  Laterality: Right;  ? PORTACATH PLACEMENT N/A 06/09/2021  ? Procedure: INSERTION PORT-A-CATH;  Surgeon: [name], MD;  Location: [place];  Service: General;  Laterality: N/A;  ? ?Patient Active Problem List  ? Diagnosis Date Noted  ? Port-A-Cath in place 06/21/2021  ? Genetic testing 05/09/2021  ? Family history of breast cancer 05/05/2021  ? Malignant neoplasm of upper-outer quadrant of right breast in female, estrogen receptor negative ([place]) 04/28/2021  ? ? ?REFERRING DIAG: Right breast cancer ? ?THERAPY DIAG:  ?Malignant neoplasm of upper-outer quadrant of right breast in female, estrogen receptor negative ([place]) ? ?Abnormal posture ? ?Aftercare following surgery for neoplasm ? ?PERTINENT HISTORY: Patient was diagnosed on 04/28/2021 with right grade 2 invasive ductal carcinoma breast cancer. She underwent a right lumpectomy and sentinel node biopsy on 05/11/2021. She had 1 node with micromets and 2 negative nodes (3  total) removed. It is ER/PR negative and HER2 negative with a Ki67 of 40%.  ? ?PRECAUTIONS: Recent Surgery, right UE Lymphedema risk ? ?SUBJECTIVE: I'm doing really good. Ready for D/C.  ? ?PAIN:  ?Are you having pain? No ? ? ? ?OBJECTIVE:  ?  ?PATIENT SURVEYS:  ?QUICK DASH: ?  Katina Dung -                                                                  06/06/21 0001                                                                   07/06/21  ?  ?  Open a tight or new jar No difficulty  No difficulty  ?  Do heavy household chores (wash walls, wash floors) Mild difficulty  No difficulty  ?  Carry a shopping bag or briefcase No difficulty  No difficulty  ?  Wash your back Moderate difficulty  No difficulty  ?  Use  a knife to cut food No difficulty  No difficulty  ?  Recreational activities in which you take some force or impact through your arm, shoulder, or hand (golf, hammering, tennis) Mild difficulty  No difficulty  ?  During the past week, to what extent has your arm, shoulder or hand problem interfered with your normal social activities with family, friends, neighbors, or groups? Slightly  No difficulty  ?  During the past week, to what extent has your arm, shoulder or hand problem limited your work or other regular daily activities Slightly  No difficulty  ?  Arm, shoulder, or hand pain. None  None  ?  Tingling (pins and needles) in your arm, shoulder, or hand None  None  ?  Difficulty Sleeping No difficulty  No difficulty  ?  DASH Score 13.64 %  0%  ?  ?   ?  ?   ?   ?  ?  ?  ?OBSERVATIONS: ?           Incision is well healed in her left lateral breast. Glue still present on incision. She has lateral breast cording (see photo).  ?  ?POSTURE:  ?Forward head and rounded shoulders posture ?  ?UPPER EXTREMITY AROM/PROM: ?  ?A/PROM RIGHT  05/05/2021 ?  RIGHT 06/06/2021 Right 07/06/21  ?Shoulder extension 49 45   ?Shoulder flexion 152 119 160  ?Shoulder abduction 164 130 175  ?Shoulder internal rotation 63 53    ?Shoulder external rotation 88 78   ?                        (Blank rows = not tested) ?  ?A/PROM LEFT  05/05/2021  ?Shoulder extension 60  ?Shoulder flexion 149  ?Shoulder abduction 162  ?Shoulder internal rotation 61  ?Shoulder external rotation 89  ?                        (Blank rows = not tested) ?  ?  ? ?  ?CERVICAL AROM: ?All within normal limits ?  ?PALPATION: ?Patient is very tender to palpation in right lateral ribs and just inferior to her right breast along her rib. ?  ?UPPER EXTREMITY STRENGTH: WNL ?  ?  ?LYMPHEDEMA ASSESSMENTS:  ?  ?LANDMARK RIGHT  05/05/2021 RIGHT 06/06/2021  ?10 cm proximal to olecranon process 34.5 33.4  ?Olecranon process 26.9 26.8  ?10 cm proximal to ulnar styloid process 23.7 23.5  ?Just proximal to ulnar styloid process 16 15.9  ?Across hand at thumb web space 19.5 18.9  ?At base of 2nd digit 6.2 6.2  ?(Blank rows = not tested) ?  ?[place] LEFT  05/05/2021 LEFT 06/06/2021  ?10 cm proximal to olecranon process 34.2 34  ?Olecranon process 26 26.1  ?10 cm proximal to ulnar styloid process 22.8 23  ?Just proximal to ulnar styloid process 16.3 16.1  ?Across hand at thumb web space 19.5 18.9  ?At base of 2nd digit 6.2 6.1  ?(Blank rows = not tested) ?  ?Todays Treatment: ? ?07/06/21: ?Manual Therapy: ?MFR: Lt S/L to Rt lateral breast cording which is much improved today and very mildly palpable ?STM: In Lt S/L over area of incision at and scar tissue surrounding same without ?  MLD: Short neck, 5 diaphragmatic breaths, Rt inguinal nodes, Rt axillo-inguinal anastomosis and focused on Rt lateral trunk at area of fullness, then     continued same in Lt S/L to work  over lateral breast at area of cording then finished retracing all steps in supine reviewing technique with pt while performing ? ?07/04/21: ?Manual Therapy: ?MFR: Lt S/L to Rt lateral breast cording which is much improved today and very mildly palpable ?STM: In Lt S/L over area of incision at and scar tissue surrounding same  without ?  MLD: Short neck, 5 diaphragmatic breaths, Rt inguinal nodes, Rt axillo-inguinal anastomosis and focused on Rt lateral trunk at area of fullness, then     continued same in Lt S/L to work over lateral breast at area of cording then finished retracing all steps in supine reviewing technique with pt while performing ? ?06/30/21: ?Manual Therapy: ?MFR: Lt S/L to Rt lateral breast cording which is much improved today and very mildly palpable ?STM: In Lt S/L over area of incision at and scar tissue surrounding same without ?  MLD: Short neck, 5 diaphragmatic breaths, Rt inguinal nodes, Rt axillo-inguinal anastomosis and focused on Rt lateral trunk at area of fullness, then     continued same in Lt S/L to work over lateral breast at area of cording then finished retracing all steps in supine beginning to instruct pt in this, having her return demo ? ? ?  ?  ?PATIENT EDUCATION:  ?Education details: HEP and lymphedema education ?Person educated: Patient ?Education method: Explanation, Demonstration, and Handouts ?Education comprehension: verbalized understanding and returned demonstration ?  ?  ?HOME EXERCISE PROGRAM: ?           Reviewed previously given post op HEP. Issued closed chain HEP. ?  ?  ?ASSESSMENT: ?  ?CLINICAL IMPRESSION: ?Pt has met all goals and is ready for D/C at this time. She knows how to perform self MLD and self MFR to Rt breast and wears her compression bra daily. She also knows she can call or return to this clinic prn at any time during her upcoming radiation and following recovery.  ?  ?Pt will benefit from skilled therapeutic intervention to improve on the following deficits: Decreased knowledge of precautions, impaired UE functional use, pain, decreased ROM, postural dysfunction.  ?  ?PT treatment/interventions: ADL/Self care home management, Therapeutic exercises, Therapeutic activity, Neuromuscular re-education, Balance training, Gait training, Patient/Family education, Joint  mobilization, Manual lymph drainage, scar mobilization, and Manual therapy ?  ?  ?  ?  ?GOALS: ?Goals reviewed with patient? Yes ?  ?LONG TERM GOALS:  (STG=LTG) ?  ?GOALS Name Target Date Goal status Goal Status  ?1 Pt will de

## 2021-07-07 ENCOUNTER — Encounter: Payer: Self-pay | Admitting: Genetic Counselor

## 2021-07-07 ENCOUNTER — Telehealth: Payer: Self-pay | Admitting: Genetic Counselor

## 2021-07-07 NOTE — Telephone Encounter (Signed)
I contacted Ms. Route to discuss her genetic testing results. No pathogenic variants were identified in the 47 genes analyzed. Detailed clinic note to follow.  The test report has been scanned into EPIC and is located under the Molecular Pathology section of the Results Review tab.  A portion of the result report is included below for reference.   Lucille Passy, MS, [facility] Genetic Counselor [email or web address] (P) [phone]

## 2021-07-11 ENCOUNTER — Inpatient Hospital Stay: Payer: BC Managed Care – PPO | Admitting: Hematology and Oncology

## 2021-07-11 ENCOUNTER — Inpatient Hospital Stay: Payer: BC Managed Care – PPO

## 2021-07-12 MED FILL — Dexamethasone Sodium Phosphate Inj 100 MG/10ML: INTRAMUSCULAR | Qty: 1 | Status: AC

## 2021-07-13 ENCOUNTER — Inpatient Hospital Stay: Payer: BC Managed Care – PPO

## 2021-07-13 ENCOUNTER — Other Ambulatory Visit: Payer: Self-pay | Admitting: Surgery

## 2021-07-13 ENCOUNTER — Encounter: Payer: Self-pay | Admitting: Hematology and Oncology

## 2021-07-13 ENCOUNTER — Ambulatory Visit (HOSPITAL_COMMUNITY)
Admission: RE | Admit: 2021-07-13 | Discharge: 2021-07-13 | Disposition: A | Discharge status: Home / Self Care | Payer: BC Managed Care – PPO | Source: Ambulatory Visit | Attending: Hematology and Oncology | Admitting: Hematology and Oncology

## 2021-07-13 ENCOUNTER — Other Ambulatory Visit: Payer: Self-pay | Admitting: *Deleted

## 2021-07-13 ENCOUNTER — Other Ambulatory Visit: Payer: Self-pay

## 2021-07-13 ENCOUNTER — Inpatient Hospital Stay (HOSPITAL_BASED_OUTPATIENT_CLINIC_OR_DEPARTMENT_OTHER): Payer: BC Managed Care – PPO | Admitting: Hematology and Oncology

## 2021-07-13 DIAGNOSIS — C50411 Malignant neoplasm of upper-outer quadrant of right female breast: Secondary | ICD-10-CM

## 2021-07-13 DIAGNOSIS — Z95828 Presence of other vascular implants and grafts: Secondary | ICD-10-CM | POA: Insufficient documentation

## 2021-07-13 DIAGNOSIS — Z171 Estrogen receptor negative status [ER-]: Secondary | ICD-10-CM

## 2021-07-13 DIAGNOSIS — Z5111 Encounter for antineoplastic chemotherapy: Secondary | ICD-10-CM | POA: Diagnosis not present

## 2021-07-13 LAB — CBC WITH DIFFERENTIAL (CANCER CENTER ONLY)
Abs Immature Granulocytes: 0.02 10*3/uL (ref 0.00–0.07)
Basophils Absolute: 0 10*3/uL (ref 0.0–0.1)
Basophils Relative: 0 %
Eosinophils Absolute: 0 10*3/uL (ref 0.0–0.5)
Eosinophils Relative: 0 %
HCT: 34.8 % — ABNORMAL LOW (ref 36.0–46.0)
Hemoglobin: 12.1 g/dL (ref 12.0–15.0)
Immature Granulocytes: 0 %
Lymphocytes Relative: 31 %
Lymphs Abs: 2.9 10*3/uL (ref 0.7–4.0)
MCH: 30.9 pg (ref 26.0–34.0)
MCHC: 34.8 g/dL (ref 30.0–36.0)
MCV: 89 fL (ref 80.0–100.0)
Monocytes Absolute: 1 10*3/uL (ref 0.1–1.0)
Monocytes Relative: 10 %
Neutro Abs: 5.5 10*3/uL (ref 1.7–7.7)
Neutrophils Relative %: 59 %
Platelet Count: 532 10*3/uL — ABNORMAL HIGH (ref 150–400)
RBC: 3.91 MIL/uL (ref 3.87–5.11)
RDW: 13.6 % (ref 11.5–15.5)
WBC Count: 9.4 10*3/uL (ref 4.0–10.5)
nRBC: 0 % (ref 0.0–0.2)

## 2021-07-13 LAB — CMP (CANCER CENTER ONLY)
ALT: 40 U/L (ref 0–44)
AST: 24 U/L (ref 15–41)
Albumin: 4.3 g/dL (ref 3.5–5.0)
Alkaline Phosphatase: 50 U/L (ref 38–126)
Anion gap: 8 (ref 5–15)
BUN: 17 mg/dL (ref 6–20)
CO2: 25 mmol/L (ref 22–32)
Calcium: 9.7 mg/dL (ref 8.9–10.3)
Chloride: 106 mmol/L (ref 98–111)
Creatinine: 1.1 mg/dL — ABNORMAL HIGH (ref 0.44–1.00)
GFR, Estimated: >60 mL/min (ref >60)
Glucose, Bld: 103 mg/dL — ABNORMAL HIGH (ref 70–99)
Potassium: 3.7 mmol/L (ref 3.5–5.1)
Sodium: 139 mmol/L (ref 135–145)
Total Bilirubin: 0.6 mg/dL (ref 0.3–1.2)
Total Protein: 7.3 g/dL (ref 6.5–8.1)

## 2021-07-13 MED ORDER — PALONOSETRON HCL INJECTION 0.25 MG/5ML
0.2500 mg | Freq: Once | INTRAVENOUS | Status: AC
  Administered 2021-07-13: 0.25 mg via INTRAVENOUS
  Filled 2021-07-13: qty 5

## 2021-07-13 MED ORDER — SODIUM CHLORIDE 0.9 % IV SOLN
600.0000 mg/m2 | Freq: Once | INTRAVENOUS | Status: AC
  Administered 2021-07-13: 1180 mg via INTRAVENOUS
  Filled 2021-07-13: qty 59

## 2021-07-13 MED ORDER — SODIUM CHLORIDE 0.9 % IV SOLN
10.0000 mg | Freq: Once | INTRAVENOUS | Status: AC
  Administered 2021-07-13: 10 mg via INTRAVENOUS
  Filled 2021-07-13: qty 10

## 2021-07-13 MED ORDER — SODIUM CHLORIDE 0.9% FLUSH
10.0000 mL | INTRAVENOUS | Status: DC | PRN
  Administered 2021-07-13: 10 mL

## 2021-07-13 MED ORDER — SODIUM CHLORIDE 0.9 % IV SOLN: Freq: Once | INTRAVENOUS | Status: AC

## 2021-07-13 MED ORDER — SODIUM CHLORIDE 0.9 % IV SOLN
75.0000 mg/m2 | Freq: Once | INTRAVENOUS | Status: AC
  Administered 2021-07-13: 150 mg via INTRAVENOUS
  Filled 2021-07-13: qty 15

## 2021-07-13 MED ORDER — HEPARIN SOD (PORK) LOCK FLUSH 100 UNIT/ML IV SOLN
500.0000 [IU] | Freq: Once | INTRAVENOUS | Status: AC | PRN
  Administered 2021-07-13: 500 [IU]

## 2021-07-13 MED ORDER — SODIUM CHLORIDE 0.9% FLUSH
10.0000 mL | Freq: Once | INTRAVENOUS | Status: AC
  Administered 2021-07-13: 10 mL

## 2021-07-13 NOTE — Progress Notes (Signed)
Verbal orders received from MD for chest xray to be completed for evaluation kinking in the porta cath tubing recently seen on chest xray from 06/09/21. Pt notified and verbalized understanding.

## 2021-07-13 NOTE — Assessment & Plan Note (Addendum)
04/21/2021:Screening mammogram detected right breast mass 6 mm by ultrasound, axilla negative, biopsy: Grade 2 IDC ER 0%, PR 0%, HER2 2+ by IHC 05/11/2021:Right lumpectomy: Grade 2 IDC 0.9 cm, margins negative, angiolymphatic invasion present, 1 lymph node with micrometastases, 0/2 other lymph nodes negative, ER 0%, PR 0%, HER22+ by IHC and FISH negative with a ratio 1.33Ki-67 25%  Treatment plan: 1.Adjuvant chemotherapy withTaxotere Cytoxan every 3 weeks x4 2.Adjuvant radiation ------------------------------------------------------------------------------------------------------------------- Current treatment: Cycle 2 Taxotere and Cytoxan Chemotoxicities: 1. Mild nausea: Resolved with antinausea medication 2. Mild diarrhea: Resolved with Imodium 3. Monitoring closely for neuropathy  Labs have been reviewed.  Patient did extremely well and she will receive the same dosage for cycle 2.  Return to clinic in 3 weeks for cycle 3

## 2021-07-13 NOTE — Patient Instructions (Signed)
[facility]ICAL [facility]COLOG[facility]  Discharge Instructions: Thank you for choosing [facility] to provide your oncology and hematology care.   If you have a lab appointment with the Cancer Center, please go directly to the Cancer Center and check in at the registration area.   Wear comfortable clothing and clothing appropriate for easy access to any Portacath or PICC line.   We strive to give you quality time with your provider. You may need to reschedule your appointment if you arrive late (15 or more minutes).  Arriving late affects you and other patients whose appointments are after yours.  Also, if you miss three or more appointments without notifying the office, you may be dismissed from the clinic at the provider's discretion.      For prescription refill requests, have your pharmacy contact our office and allow 72 hours for refills to be completed.    Today you received the following chemotherapy and/or immunotherapy agents: Docetaxel (Taxotere) and Cyclophosphamide (Cytoxan)   To help prevent nausea and vomiting after your treatment, we encourage you to take your nausea medication as directed.  BELOW ARE SYMPTOMS THAT SHOULD BE REPORTED IMMEDIATELY: *FEVER GREATER THAN 100.4 F (38 C) OR HIGHER *CHILLS OR SWEATING *NAUSEA AND VOMITING THAT IS NOT CONTROLLED WITH YOUR NAUSEA MEDICATION *UNUSUAL SHORTNESS OF BREATH *UNUSUAL BRUISING OR BLEEDING *URINARY PROBLEMS (pain or burning when urinating, or frequent urination) *BOWEL PROBLEMS (unusual diarrhea, constipation, pain near the anus) TENDERNESS IN MOUTH AND THROAT WITH OR WITHOUT PRESENCE OF ULCERS (sore throat, sores in mouth, or a toothache) UNUSUAL RASH, SWELLING OR PAIN  UNUSUAL VAGINAL DISCHARGE OR ITCHING   Items with * indicate a potential emergency and should be followed up as soon as possible or go to the Emergency Department if any problems should occur.  Please show the CHEMOTHERAPY ALERT CARD or  IMMUNOTHERAPY ALERT CARD at check-in to the Emergency Department and triage nurse.  Should you have questions after your visit or need to cancel or reschedule your appointment, please contact [facility]ICAL [facility]COL[facility]  Dept: [phone]  and follow the prompts.  Office hours are 8:00 a.m. to 4:30 p.m. Monday - Friday. Please note that voicemails left after 4:00 p.m. may not be returned until the following business day.  We are closed weekends and major holidays. You have access to a nurse at all times for urgent questions. Please call the main number to the clinic Dept: [phone] and follow the prompts.   For any non-urgent questions, you may also contact your provider using MyChart. We now offer e-Visits for anyone 18 and older to request care online for non-urgent symptoms. For details visit [email or web address].   Also download the MyChart app! Go to the app store, search "MyChart", open the app, select [place], and log in with your MyChart username and password.  Due to Covid, a mask is required upon entering the hospital/clinic. If you do not have a mask, one will be given to you upon arrival. For doctor visits, patients may have 1 support person aged 18 or older with them. For treatment visits, patients cannot have anyone with them due to current Covid guidelines and our immunocompromised population.  

## 2021-07-13 NOTE — Progress Notes (Signed)
Per Dr. Lindi Adie - okay to proceed with treatment today with port-a-cath catheter noted to have kink and tip placement "is in the mid to distal right atrium". Dr. Trevor Mace team will be reaching out to patient regarding assessment and possible adjustment of patient's port placement.   Patient has imaging ordered to assess status of port-a-cath. Patient will proceed to [facility] following infusion to have imaging completed. Patient aware and in agreement to plan.

## 2021-07-15 ENCOUNTER — Other Ambulatory Visit: Payer: Self-pay

## 2021-07-15 ENCOUNTER — Inpatient Hospital Stay: Payer: BC Managed Care – PPO

## 2021-07-15 VITALS — BP 130/79 | HR 71 | Temp 99.0°F | Resp 18

## 2021-07-15 DIAGNOSIS — Z5111 Encounter for antineoplastic chemotherapy: Secondary | ICD-10-CM | POA: Diagnosis not present

## 2021-07-15 DIAGNOSIS — Z171 Estrogen receptor negative status [ER-]: Secondary | ICD-10-CM

## 2021-07-15 MED ORDER — PEGFILGRASTIM-CBQV 6 MG/0.6ML ~~LOC~~ SOSY
6.0000 mg | PREFILLED_SYRINGE | Freq: Once | SUBCUTANEOUS | Status: AC
  Administered 2021-07-15: 6 mg via SUBCUTANEOUS
  Filled 2021-07-15: qty 0.6

## 2021-07-18 ENCOUNTER — Encounter: Payer: Self-pay | Admitting: Hematology and Oncology

## 2021-07-28 ENCOUNTER — Telehealth: Payer: Self-pay | Admitting: Hematology and Oncology

## 2021-07-28 ENCOUNTER — Encounter: Payer: Self-pay | Admitting: Hematology and Oncology

## 2021-07-28 NOTE — Telephone Encounter (Signed)
Called patient to inform her that during her 08/24/21 appointment, she will not be seeing Dr.Gudena due to him being on PAL. Instead, she was placed with Wilber Bihari. Left message.

## 2021-08-01 ENCOUNTER — Inpatient Hospital Stay: Payer: BC Managed Care – PPO | Admitting: Adult Health

## 2021-08-01 ENCOUNTER — Inpatient Hospital Stay: Payer: BC Managed Care – PPO

## 2021-08-02 MED FILL — Dexamethasone Sodium Phosphate Inj 100 MG/10ML: INTRAMUSCULAR | Qty: 1 | Status: AC

## 2021-08-03 ENCOUNTER — Inpatient Hospital Stay: Payer: BC Managed Care – PPO | Attending: Hematology and Oncology

## 2021-08-03 ENCOUNTER — Other Ambulatory Visit: Payer: Self-pay

## 2021-08-03 ENCOUNTER — Inpatient Hospital Stay (HOSPITAL_BASED_OUTPATIENT_CLINIC_OR_DEPARTMENT_OTHER): Payer: BC Managed Care – PPO | Admitting: Adult Health

## 2021-08-03 ENCOUNTER — Inpatient Hospital Stay: Payer: BC Managed Care – PPO

## 2021-08-03 ENCOUNTER — Encounter: Payer: Self-pay | Admitting: Adult Health

## 2021-08-03 ENCOUNTER — Other Ambulatory Visit: Payer: Self-pay | Admitting: *Deleted

## 2021-08-03 VITALS — BP 129/93 | HR 90 | Temp 97.7°F | Resp 18 | Ht 64.0 in | Wt 192.7 lb

## 2021-08-03 VITALS — BP 128/86 | HR 72

## 2021-08-03 DIAGNOSIS — Z5111 Encounter for antineoplastic chemotherapy: Secondary | ICD-10-CM | POA: Diagnosis present

## 2021-08-03 DIAGNOSIS — Z5189 Encounter for other specified aftercare: Secondary | ICD-10-CM | POA: Diagnosis not present

## 2021-08-03 DIAGNOSIS — Z171 Estrogen receptor negative status [ER-]: Secondary | ICD-10-CM

## 2021-08-03 DIAGNOSIS — C50411 Malignant neoplasm of upper-outer quadrant of right female breast: Secondary | ICD-10-CM | POA: Diagnosis present

## 2021-08-03 DIAGNOSIS — Z79899 Other long term (current) drug therapy: Secondary | ICD-10-CM | POA: Insufficient documentation

## 2021-08-03 DIAGNOSIS — Z95828 Presence of other vascular implants and grafts: Secondary | ICD-10-CM

## 2021-08-03 LAB — CMP (CANCER CENTER ONLY)
ALT: 37 U/L (ref 0–44)
AST: 20 U/L (ref 15–41)
Albumin: 4.3 g/dL (ref 3.5–5.0)
Alkaline Phosphatase: 54 U/L (ref 38–126)
Anion gap: 8 (ref 5–15)
BUN: 13 mg/dL (ref 6–20)
CO2: 25 mmol/L (ref 22–32)
Calcium: 9.8 mg/dL (ref 8.9–10.3)
Chloride: 104 mmol/L (ref 98–111)
Creatinine: 0.75 mg/dL (ref 0.44–1.00)
GFR, Estimated: >60 mL/min (ref >60)
Glucose, Bld: 144 mg/dL — ABNORMAL HIGH (ref 70–99)
Potassium: 4.1 mmol/L (ref 3.5–5.1)
Sodium: 137 mmol/L (ref 135–145)
Total Bilirubin: 0.3 mg/dL (ref 0.3–1.2)
Total Protein: 7.1 g/dL (ref 6.5–8.1)

## 2021-08-03 LAB — CBC WITH DIFFERENTIAL (CANCER CENTER ONLY)
Abs Immature Granulocytes: 0.04 10*3/uL (ref 0.00–0.07)
Basophils Absolute: 0 10*3/uL (ref 0.0–0.1)
Basophils Relative: 0 %
Eosinophils Absolute: 0 10*3/uL (ref 0.0–0.5)
Eosinophils Relative: 0 %
HCT: 35.7 % — ABNORMAL LOW (ref 36.0–46.0)
Hemoglobin: 12.2 g/dL (ref 12.0–15.0)
Immature Granulocytes: 0 %
Lymphocytes Relative: 18 %
Lymphs Abs: 2 10*3/uL (ref 0.7–4.0)
MCH: 31.3 pg (ref 26.0–34.0)
MCHC: 34.2 g/dL (ref 30.0–36.0)
MCV: 91.5 fL (ref 80.0–100.0)
Monocytes Absolute: 0.9 10*3/uL (ref 0.1–1.0)
Monocytes Relative: 8 %
Neutro Abs: 8.1 10*3/uL — ABNORMAL HIGH (ref 1.7–7.7)
Neutrophils Relative %: 74 %
Platelet Count: 399 10*3/uL (ref 150–400)
RBC: 3.9 MIL/uL (ref 3.87–5.11)
RDW: 15.1 % (ref 11.5–15.5)
WBC Count: 11.1 10*3/uL — ABNORMAL HIGH (ref 4.0–10.5)
nRBC: 0 % (ref 0.0–0.2)

## 2021-08-03 MED ORDER — SODIUM CHLORIDE 0.9 % IV SOLN
600.0000 mg/m2 | Freq: Once | INTRAVENOUS | Status: AC
  Administered 2021-08-03: 1180 mg via INTRAVENOUS
  Filled 2021-08-03: qty 59

## 2021-08-03 MED ORDER — SODIUM CHLORIDE 0.9 % IV SOLN
75.0000 mg/m2 | Freq: Once | INTRAVENOUS | Status: AC
  Administered 2021-08-03: 150 mg via INTRAVENOUS
  Filled 2021-08-03: qty 15

## 2021-08-03 MED ORDER — HEPARIN SOD (PORK) LOCK FLUSH 100 UNIT/ML IV SOLN
500.0000 [IU] | Freq: Once | INTRAVENOUS | Status: AC | PRN
  Administered 2021-08-03: 500 [IU]

## 2021-08-03 MED ORDER — SODIUM CHLORIDE 0.9 % IV SOLN
10.0000 mg | Freq: Once | INTRAVENOUS | Status: AC
  Administered 2021-08-03: 10 mg via INTRAVENOUS
  Filled 2021-08-03: qty 10

## 2021-08-03 MED ORDER — SODIUM CHLORIDE 0.9% FLUSH
10.0000 mL | INTRAVENOUS | Status: DC | PRN
  Administered 2021-08-03: 10 mL

## 2021-08-03 MED ORDER — PALONOSETRON HCL INJECTION 0.25 MG/5ML
0.2500 mg | Freq: Once | INTRAVENOUS | Status: AC
  Administered 2021-08-03: 0.25 mg via INTRAVENOUS
  Filled 2021-08-03: qty 5

## 2021-08-03 MED ORDER — SODIUM CHLORIDE 0.9% FLUSH
10.0000 mL | Freq: Once | INTRAVENOUS | Status: AC
  Administered 2021-08-03: 10 mL

## 2021-08-03 MED ORDER — SODIUM CHLORIDE 0.9 % IV SOLN: Freq: Once | INTRAVENOUS | Status: AC

## 2021-08-03 NOTE — Assessment & Plan Note (Signed)
04/21/2021:Screening mammogram detected right breast mass 6 mm by ultrasound, axilla negative, biopsy: Grade 2 IDC ER 0%, PR 0%, HER2 2+ by IHC 05/11/2021:Right lumpectomy: Grade 2 IDC 0.9 cm, margins negative, angiolymphatic invasion present, 1 lymph node with micrometastases, 0/2 other lymph nodes negative, ER 0%, PR 0%, HER22+ by IHC and FISH negative with a ratio 1.33Ki-67 25%  Treatment plan: 1.Adjuvant chemotherapy withTaxotere Cytoxan every 3 weeks x4 2.Adjuvant radiation 3.  Discussed [facility] testing ------------------------------------------------------------------------------------------------------------------- Current treatment: Cycle 3 Taxotere and Cytoxan Chemotoxicities: mild diarrhea Monitoring closely for neuropathy  We reviewed that she will receive chemo every 3 weeks and next week will be her final cycle.  She requested that we go ahead and coordinate with surgery her port removal and radiation oncology her adjuvant radiation therapy so that she can get this all together prior to going on vacation in August, and so she can return and quickly begin her adjuvant radiation therapy.  I asked our nurse navigators donning [name] with Dr. Ninfa Linden and Dr. Lisbeth Renshaw copied on the message to go ahead and work on getting this arranged.  I will also have my nurse fax wig [facility] so she can get some legs from there.  We reviewed Natasha Ortega's labs which are stable.  She will proceed with her treatment today.  She will return in 3 weeks for labs, follow-up, and her final chemotherapy infusion.

## 2021-08-03 NOTE — Progress Notes (Signed)
Signed order per MD sent to [facility] for cranial prosthesis per pt request with instructions to call pt. Demographic information attached as well. Fax confirmation received.

## 2021-08-03 NOTE — Patient Instructions (Signed)
[facility]ICAL [facility]COLOG[facility]  Discharge Instructions: Thank you for choosing [facility] to provide your oncology and hematology care.   If you have a lab appointment with the Cancer Center, please go directly to the Cancer Center and check in at the registration area.   Wear comfortable clothing and clothing appropriate for easy access to any Portacath or PICC line.   We strive to give you quality time with your provider. You may need to reschedule your appointment if you arrive late (15 or more minutes).  Arriving late affects you and other patients whose appointments are after yours.  Also, if you miss three or more appointments without notifying the office, you may be dismissed from the clinic at the provider's discretion.      For prescription refill requests, have your pharmacy contact our office and allow 72 hours for refills to be completed.    Today you received the following chemotherapy and/or immunotherapy agents: Docetaxel, Cytoxan.       To help prevent nausea and vomiting after your treatment, we encourage you to take your nausea medication as directed.  BELOW ARE SYMPTOMS THAT SHOULD BE REPORTED IMMEDIATELY: *FEVER GREATER THAN 100.4 F (38 C) OR HIGHER *CHILLS OR SWEATING *NAUSEA AND VOMITING THAT IS NOT CONTROLLED WITH YOUR NAUSEA MEDICATION *UNUSUAL SHORTNESS OF BREATH *UNUSUAL BRUISING OR BLEEDING *URINARY PROBLEMS (pain or burning when urinating, or frequent urination) *BOWEL PROBLEMS (unusual diarrhea, constipation, pain near the anus) TENDERNESS IN MOUTH AND THROAT WITH OR WITHOUT PRESENCE OF ULCERS (sore throat, sores in mouth, or a toothache) UNUSUAL RASH, SWELLING OR PAIN  UNUSUAL VAGINAL DISCHARGE OR ITCHING   Items with * indicate a potential emergency and should be followed up as soon as possible or go to the Emergency Department if any problems should occur.  Please show the CHEMOTHERAPY ALERT CARD or IMMUNOTHERAPY ALERT CARD at  check-in to the Emergency Department and triage nurse.  Should you have questions after your visit or need to cancel or reschedule your appointment, please contact [facility]  Dept: [phone]  and follow the prompts.  Office hours are 8:00 a.m. to 4:30 p.m. Monday - Friday. Please note that voicemails left after 4:00 p.m. may not be returned until the following business day.  We are closed weekends and major holidays. You have access to a nurse at all times for urgent questions. Please call the main number to the clinic Dept: [phone] and follow the prompts.   For any non-urgent questions, you may also contact your provider using MyChart. We now offer e-Visits for anyone 18 and older to request care online for non-urgent symptoms. For details visit [email or web address].   Also download the MyChart app! Go to the app store, search "MyChart", open the app, select [place], and log in with your MyChart username and password.  Masks are optional in the cancer centers. If you would like for your care team to wear a mask while they are taking care of you, please let them know. For doctor visits, patients may have with them one support person who is at least 53 years old. At this time, visitors are not allowed in the infusion area. 

## 2021-08-03 NOTE — Progress Notes (Signed)
[name] Follow up:    Louretta Shorten, MD [address] [place] [address] [place] [address]   DIAGNOSIS:  Cancer Staging  Malignant neoplasm of upper-outer quadrant of right breast in female, estrogen receptor negative ([place]) Staging form: Breast, AJCC 8th Edition - Pathologic stage from 05/11/2021: Stage IB (pT1b, pN55mi, cM0, G2, ER-, PR-, HER2-) - Signed by Gardenia Phlegm, NP on 08/03/2021 Stage prefix: Initial diagnosis Histologic grading system: 3 grade system   SUMMARY OF ONCOLOGIC HISTORY: Oncology History  Malignant neoplasm of upper-outer quadrant of right breast in female, estrogen receptor negative ([place])  04/21/2021 Initial Diagnosis   Screening mammogram detected right breast mass 6 mm by ultrasound, axilla negative, biopsy: Grade 2 IDC ER 0%, PR 0%, HER2 2+ by [facility]   05/11/2021 Surgery   Right lumpectomy: Grade 2 IDC 0.9 cm, margins negative, angiolymphatic invasion present, 1 lymph node with micrometastases, 0/2 other lymph nodes negative, ER 0%, PR 0%, HER2 negative by FISH Ki-67 25% (final pathology is negative for HER2)   05/11/2021 Cancer Staging   Staging form: Breast, AJCC 8th Edition - Pathologic stage from 05/11/2021: Stage IB (pT1b, pN50mi, cM0, G2, ER-, PR-, HER2-) - Signed by Gardenia Phlegm, NP on 08/03/2021 Stage prefix: Initial diagnosis Histologic grading system: 3 grade system   06/22/2021 -  Chemotherapy   Patient is on Treatment Plan : BREAST TC q21d      Genetic Testing   [facility] CustomNext Panel was Negative. Report date is 07/05/2021.  The CustomNext-Cancer+RNAinsight panel offered by [facility] includes sequencing and rearrangement analysis for the following 47 genes:  APC, ATM, AXIN2, BARD1, BMPR1A, BRCA1, BRCA2, BRIP1, CDH1, CDK4, CDKN2A, CHEK2, CTNNA1, DICER1, EPCAM, GREM1, HOXB13, KIT, MEN1, MLH1, MSH2, MSH3, MSH6, MUTYH, NBN, NF1, NTHL1, PALB2, PDGFRA, PMS2, POLD1, POLE, PTEN, RAD50, RAD51C, RAD51D, SDHA, SDHB,  SDHC, SDHD, SMAD4, SMARCA4, STK11, TP53, TSC1, TSC2, and VHL.  RNA data is routinely analyzed for use in variant interpretation for all genes.     CURRENT THERAPY: Taxotere and Cytoxan cycle 3.  INTERVAL HISTORY: Natasha Ortega 51 y.o. female returns for follow-up prior to receiving cycle 3 of adjuvant chemotherapy with Taxotere and Cytoxan.  She lives tolerating the chemotherapy moderately well.  She does not have any peripheral neuropathy.  She denies any mouth sores.  She did note an inner thigh skin rash that was a circular red lesion these are pictured in her MyChart messages.  There are no other lesions other than the single lesion.  She has been applying Neosporin which has been fading the lesion.  She denies any itching or pain to the area.  And it is small approximately dime size.  She tells me that she does experience fatigue following her treatment however does seem to improve after a few days.  She denies any significant nausea or vomiting.   Patient Active Problem List   Diagnosis Date Noted   Port-A-Cath in place 06/21/2021   Genetic testing 05/09/2021   Family history of breast cancer 05/05/2021   Malignant neoplasm of upper-outer quadrant of right breast in female, estrogen receptor negative ([place]) 04/28/2021    is allergic to penicillins.  MEDICAL HISTORY: Past Medical History:  Diagnosis Date   Cancer [facility])    breast   Headache    migraines    SURGICAL HISTORY: Past Surgical History:  Procedure Laterality Date   BREAST LUMPECTOMY WITH RADIOACTIVE SEED AND SENTINEL LYMPH NODE BIOPSY Right 05/11/2021   Procedure: RIGHT BREAST  LUMPECTOMY WITH RADIOACTIVE SEED AND SENTINEL LYMPH NODE BIOPSY;  Surgeon: [name], MD;  Location: [place];  Service: General;  Laterality: Right;   PORTACATH PLACEMENT N/A 06/09/2021   Procedure: INSERTION PORT-A-CATH;  Surgeon: [name], MD;  Location: [place];  Service: General;  Laterality: N/A;     SOCIAL HISTORY: Social History   Socioeconomic History   Marital status: Married    Spouse name: Not on file   Number of children: Not on file   Years of education: Not on file   Highest education level: Not on file  Occupational History   Not on file  Tobacco Use   Smoking status: Never   Smokeless tobacco: Never  Vaping Use   Vaping Use: Never used  Substance and Sexual Activity   Alcohol use: Not Currently    Comment: Occasional   Drug use: Never   Sexual activity: Yes    Partners: Male  Other Topics Concern   Not on file  Social History Narrative   Not on file   Social Determinants of Health   Financial Resource Strain: Not on file  Food Insecurity: Not on file  Transportation Needs: Not on file  Physical Activity: Not on file  Stress: Not on file  Social Connections: Not on file  Intimate Partner Violence: Not on file    FAMILY HISTORY: Family History  Problem Relation Age of Onset   Breast cancer Maternal Grandmother 36       dx. again at 47, unsure if recurrence or second primary   Prostate cancer Paternal Grandfather    Breast cancer Other    Brain cancer Other     Review of Systems  Constitutional:  Negative for appetite change, chills, fatigue, fever and unexpected weight change.  HENT:   Negative for hearing loss, lump/mass and trouble swallowing.   Eyes:  Negative for eye problems and icterus.  Respiratory:  Negative for chest tightness, cough and shortness of breath.   Cardiovascular:  Negative for chest pain, leg swelling and palpitations.  Gastrointestinal:  Negative for abdominal distention, abdominal pain, constipation, diarrhea, nausea and vomiting.  Endocrine: Negative for hot flashes.  Genitourinary:  Negative for difficulty urinating.   Musculoskeletal:  Negative for arthralgias.  Skin:  Negative for itching and rash.  Neurological:  Negative for dizziness, extremity weakness, headaches and numbness.  Hematological:  Negative for  adenopathy. Does not bruise/bleed easily.  Psychiatric/Behavioral:  Negative for depression. The patient is not nervous/anxious.       PHYSICAL EXAMINATION  ECOG PERFORMANCE STATUS: 1 - Symptomatic but completely ambulatory  Vitals:   08/03/21 1056  BP: (!) 129/93  Pulse: 90  Resp: 18  Temp: 97.7 F (36.5 C)  SpO2: 99%    Physical Exam Constitutional:      General: She is not in acute distress.    Appearance: Normal appearance. She is not toxic-appearing.  HENT:     Head: Normocephalic and atraumatic.  Eyes:     General: No scleral icterus. Cardiovascular:     Rate and Rhythm: Normal rate and regular rhythm.     Pulses: Normal pulses.     Heart sounds: Normal heart sounds.  Pulmonary:     Effort: Pulmonary effort is normal.     Breath sounds: Normal breath sounds.  Abdominal:     General: Abdomen is flat. Bowel sounds are normal. There is no distension.     Palpations: Abdomen is soft.     Tenderness: There is no  abdominal tenderness.  Musculoskeletal:        General: No swelling.     Cervical back: Neck supple.  Lymphadenopathy:     Cervical: No cervical adenopathy.  Skin:    General: Skin is warm and dry.     Findings: No rash.  Neurological:     General: No focal deficit present.     Mental Status: She is alert.  Psychiatric:        Mood and Affect: Mood normal.        Behavior: Behavior normal.     LABORATORY DATA:  CBC    Component Value Date/Time   WBC 11.1 (H) 08/03/2021 1044   WBC 5.8 05/11/2021 0850   RBC 3.90 08/03/2021 1044   HGB 12.2 08/03/2021 1044   HCT 35.7 (L) 08/03/2021 1044   PLT 399 08/03/2021 1044   MCV 91.5 08/03/2021 1044   MCH 31.3 08/03/2021 1044   MCHC 34.2 08/03/2021 1044   RDW 15.1 08/03/2021 1044   LYMPHSABS 2.0 08/03/2021 1044   MONOABS 0.9 08/03/2021 1044   EOSABS 0.0 08/03/2021 1044   BASOSABS 0.0 08/03/2021 1044    CMP     Component Value Date/Time   NA 137 08/03/2021 1044   K 4.1 08/03/2021 1044   CL 104  08/03/2021 1044   CO2 25 08/03/2021 1044   GLUCOSE 144 (H) 08/03/2021 1044   BUN 13 08/03/2021 1044   CREATININE 0.75 08/03/2021 1044   CALCIUM 9.8 08/03/2021 1044   PROT 7.1 08/03/2021 1044   ALBUMIN 4.3 08/03/2021 1044   AST 20 08/03/2021 1044   ALT 37 08/03/2021 1044   ALKPHOS 54 08/03/2021 1044   BILITOT 0.3 08/03/2021 1044   GFRNONAA >60 08/03/2021 1044       PENDING LABS:   RADIOGRAPHIC STUDIES:  No results found.   PATHOLOGY:     ASSESSMENT and THERAPY PLAN:   Malignant neoplasm of upper-outer quadrant of right breast in female, estrogen receptor negative ([place]) 04/21/2021:Screening mammogram detected right breast mass 6 mm by ultrasound, axilla negative, biopsy: Grade 2 IDC ER 0%, PR 0%, HER2 2+ by IHC 05/11/2021:Right lumpectomy: Grade 2 IDC 0.9 cm, margins negative, angiolymphatic invasion present, 1 lymph node with micrometastases, 0/2 other lymph nodes negative, ER 0%, PR 0%, HER2 2+ by IHC and FISH negative with a ratio 1.33 Ki-67 25%   Treatment plan: 1.  Adjuvant chemotherapy with Taxotere Cytoxan every 3 weeks x4 2.  Adjuvant radiation 3.  Discussed [facility] testing ------------------------------------------------------------------------------------------------------------------- Current treatment: Cycle 3 Taxotere and Cytoxan Chemotoxicities: mild diarrhea Monitoring closely for neuropathy  We reviewed that she will receive chemo every 3 weeks and next week will be her final cycle.  She requested that we go ahead and coordinate with surgery her port removal and radiation oncology her adjuvant radiation therapy so that she can get this all together prior to going on vacation in August, and so she can return and quickly begin her adjuvant radiation therapy.  I asked our nurse navigators donning [name] with Dr. Ninfa Linden and Dr. Lisbeth Renshaw copied on the message to go ahead and work on getting this arranged.  I will also have my nurse fax wig prescription over to a  special place for the patient so she can get some legs from there.  We reviewed Valisa's labs which are stable.  She will proceed with her treatment today.  She will return in 3 weeks for labs, follow-up, and her final chemotherapy infusion.    All questions were  answered. The patient knows to call the clinic with any problems, questions or concerns. We can certainly see the patient much sooner if necessary.  Total encounter time:30 minutes*in face-to-face visit time, chart review, lab review, care coordination, order entry, and documentation of the encounter time.    Wilber Bihari, NP 08/03/21 11:58 AM Medical Oncology and Hematology [facility] [place], [place] [address] Tel. [phone]    Fax. [phone]  *Total Encounter Time as defined by the Centers for Medicare and Medicaid Services includes, in addition to the face-to-face time of a patient visit (documented in the note above) non-face-to-face time: obtaining and reviewing outside history, ordering and reviewing medications, tests or procedures, care coordination (communications with other health care professionals or caregivers) and documentation in the medical record.

## 2021-08-04 ENCOUNTER — Telehealth: Payer: Self-pay | Admitting: *Deleted

## 2021-08-04 NOTE — Telephone Encounter (Signed)
Spoke to pt concerning port removal and referral to Dr. Lisbeth Renshaw. Pt going on vacation in Aug. Request to have port removed 3 wks after final chemo on 7/5 and see Dr. Lisbeth Renshaw with SIM prior to her vacation Informed pt msg sent to Dr. Ninfa Linden  and his nurse regarding port removal as well as to Dr. Ida Rogue scheduling team. No further needs voiced at this time.

## 2021-08-05 ENCOUNTER — Encounter: Payer: Self-pay | Admitting: *Deleted

## 2021-08-05 ENCOUNTER — Telehealth: Payer: Self-pay | Admitting: Radiation Oncology

## 2021-08-05 ENCOUNTER — Other Ambulatory Visit: Payer: Self-pay

## 2021-08-05 ENCOUNTER — Inpatient Hospital Stay: Payer: BC Managed Care – PPO

## 2021-08-05 VITALS — BP 118/79 | HR 77 | Temp 97.9°F | Resp 17

## 2021-08-05 DIAGNOSIS — Z171 Estrogen receptor negative status [ER-]: Secondary | ICD-10-CM

## 2021-08-05 DIAGNOSIS — Z5111 Encounter for antineoplastic chemotherapy: Secondary | ICD-10-CM | POA: Diagnosis not present

## 2021-08-05 MED ORDER — PEGFILGRASTIM-CBQV 6 MG/0.6ML ~~LOC~~ SOSY
6.0000 mg | PREFILLED_SYRINGE | Freq: Once | SUBCUTANEOUS | Status: AC
  Administered 2021-08-05: 6 mg via SUBCUTANEOUS

## 2021-08-05 NOTE — Telephone Encounter (Signed)
LVM to schedule FUN with Dr. Solon Augusta

## 2021-08-08 ENCOUNTER — Other Ambulatory Visit: Payer: Self-pay | Admitting: *Deleted

## 2021-08-08 ENCOUNTER — Ambulatory Visit: Payer: BC Managed Care – PPO | Attending: Hematology and Oncology | Admitting: Physical Therapy

## 2021-08-08 ENCOUNTER — Other Ambulatory Visit: Payer: Self-pay | Admitting: Surgery

## 2021-08-08 ENCOUNTER — Encounter: Payer: Self-pay | Admitting: Radiation Oncology

## 2021-08-08 ENCOUNTER — Encounter: Payer: Self-pay | Admitting: Physical Therapy

## 2021-08-08 DIAGNOSIS — C50411 Malignant neoplasm of upper-outer quadrant of right female breast: Secondary | ICD-10-CM | POA: Insufficient documentation

## 2021-08-08 DIAGNOSIS — Z483 Aftercare following surgery for neoplasm: Secondary | ICD-10-CM | POA: Insufficient documentation

## 2021-08-08 DIAGNOSIS — Z171 Estrogen receptor negative status [ER-]: Secondary | ICD-10-CM

## 2021-08-08 DIAGNOSIS — R293 Abnormal posture: Secondary | ICD-10-CM | POA: Insufficient documentation

## 2021-08-08 NOTE — Therapy (Signed)
  OUTPATIENT PHYSICAL THERAPY SOZO SCREENING NOTE   Patient Name: Natasha Ortega MRN: [id number] DOB:[birth year], 51 y.o., female Today's Date: 08/08/2021  PCP: Louretta Shorten, MD REFERRING PROVIDER: Nicholas Lose, MD   PT End of Session - 08/08/21 1324     Visit Number 10    Number of Visits 10    PT Start Time 1305    PT Stop Time 1324    PT Time Calculation (min) 19 min    Activity Tolerance Patient tolerated treatment well    Behavior During Therapy WFL for tasks assessed/performed             Past Medical History:  Diagnosis Date   Cancer ([place])    breast   Headache    migraines   Past Surgical History:  Procedure Laterality Date   BREAST LUMPECTOMY WITH RADIOACTIVE SEED AND SENTINEL LYMPH NODE BIOPSY Right 05/11/2021   Procedure: RIGHT BREAST LUMPECTOMY WITH RADIOACTIVE SEED AND SENTINEL LYMPH NODE BIOPSY;  Surgeon: [name], MD;  Location: [place];  Service: General;  Laterality: Right;   PORTACATH PLACEMENT N/A 06/09/2021   Procedure: INSERTION PORT-A-CATH;  Surgeon: [name], MD;  Location: [place];  Service: General;  Laterality: N/A;   Patient Active Problem List   Diagnosis Date Noted   Port-A-Cath in place 06/21/2021   Genetic testing 05/09/2021   Family history of breast cancer 05/05/2021   Malignant neoplasm of upper-outer quadrant of right breast in female, estrogen receptor negative ([place]) 04/28/2021    REFERRING DIAG: right breast cancer at risk for lymphedema  THERAPY DIAG:  Aftercare following surgery for neoplasm  PERTINENT HISTORY: Patient was diagnosed on 04/28/2021 with right grade 2 invasive ductal carcinoma breast cancer. She underwent a right lumpectomy and sentinel node biopsy on 05/11/2021. She had 1 node with micromets and 2 negative nodes (3 total) removed. It is ER/PR negative and HER2 negative with a Ki67 of 40%.   PRECAUTIONS: right UE Lymphedema risk  SUBJECTIVE: Pt here for a SOZO screen but also c/o  right axillary and lateral breast cording which resolved with PT but has recently returned.  PAIN:  Are you having pain? No  SOZO SCREENING: Patient was assessed today using the SOZO machine to determine the lymphedema index score. This was compared to her baseline score. It was determined that she is within the recommended range when compared to her baseline and no further action is needed at this time. She will continue SOZO screenings. These are done every 3 months for 2 years post operatively followed by every 6 months for 2 years, and then annually.   L-DEX FLOWSHEETS - 08/08/21 1300       L-DEX LYMPHEDEMA SCREENING   Measurement Type Unilateral    L-DEX MEASUREMENT EXTREMITY Upper Extremity    POSITION  Standing    DOMINANT SIDE Right    At Risk Side Right    BASELINE SCORE (UNILATERAL) -5.6    L-DEX SCORE (UNILATERAL) -10.8    VALUE CHANGE (UNILAT) -5.2             Annia Friendly, [place] 08/08/21 1:27 PM

## 2021-08-09 ENCOUNTER — Ambulatory Visit: Payer: BC Managed Care – PPO | Admitting: Physical Therapy

## 2021-08-09 DIAGNOSIS — Z171 Estrogen receptor negative status [ER-]: Secondary | ICD-10-CM | POA: Diagnosis present

## 2021-08-09 DIAGNOSIS — R293 Abnormal posture: Secondary | ICD-10-CM | POA: Diagnosis present

## 2021-08-09 DIAGNOSIS — C50411 Malignant neoplasm of upper-outer quadrant of right female breast: Secondary | ICD-10-CM

## 2021-08-09 DIAGNOSIS — Z483 Aftercare following surgery for neoplasm: Secondary | ICD-10-CM | POA: Diagnosis present

## 2021-08-09 NOTE — Therapy (Signed)
OUTPATIENT PHYSICAL THERAPY ONCOLOGY EVALUATION  Patient Name: Natasha Ortega MRN: [id number] DOB:04-08-1970, 51 y.o., female Today's Date: 08/09/2021   PT End of Session - 08/09/21 1123     Visit Number 1    Number of Visits 9    Date for PT Re-Evaluation 09/08/21    PT Start Time 1000    PT Stop Time 1055    PT Time Calculation (min) 55 min    Activity Tolerance Patient tolerated treatment well    Behavior During Therapy WFL for tasks assessed/performed             Past Medical History:  Diagnosis Date   Cancer ([place])    breast   Headache    migraines   Past Surgical History:  Procedure Laterality Date   BREAST LUMPECTOMY WITH RADIOACTIVE SEED AND SENTINEL LYMPH NODE BIOPSY Right 05/11/2021   Procedure: RIGHT BREAST LUMPECTOMY WITH RADIOACTIVE SEED AND SENTINEL LYMPH NODE BIOPSY;  Surgeon: [name], MD;  Location: [place];  Service: General;  Laterality: Right;   PORTACATH PLACEMENT N/A 06/09/2021   Procedure: INSERTION PORT-A-CATH;  Surgeon: [name], MD;  Location: [place];  Service: General;  Laterality: N/A;   Patient Active Problem List   Diagnosis Date Noted   Port-A-Cath in place 06/21/2021   Genetic testing 05/09/2021   Family history of breast cancer 05/05/2021   Malignant neoplasm of upper-outer quadrant of right breast in female, estrogen receptor negative ([place]) 04/28/2021    PCP:  Louretta Shorten   REFERRING PROVIDER: Dr Jerilynn Birkenhead DIAG: malignant neoplasm of right breast   THERAPY DIAG:  Aftercare following surgery for neoplasm - Plan: PT plan of care cert/re-cert  Malignant neoplasm of upper-outer quadrant of right breast in female, estrogen receptor negative ([place]) - Plan: PT plan of care cert/re-cert  Abnormal posture - Plan: PT plan of care cert/re-cert  ONSET DATE: 02/26/9676  Rationale for Evaluation and Treatment Rehabilitation  SUBJECTIVE                                                                                                                                                                                            SUBJECTIVE STATEMENT:  Pt has been doing her stretches at home and last week noticed some cording in her right axilla  that was not there before She has her final chemo treatment on July 5 She plans to fly to [place] and She plans to start radiation around August 21  She feels the coding from the breast up to the shoulder and front of chest but does not go into armpit or down into  arm. She is wear [employer] with foam patch in lateral breast that she wears 24/7   PERTINENT HISTORY:    Patient was diagnosed on 04/28/2021 with right grade 2 invasive ductal carcinoma breast cancer. She underwent a right lumpectomy and sentinel node biopsy on 05/11/2021. She had 1 node with micromets and 2 negative nodes (3 total) removed. It is ER/PR negative and HER2 negative with a Ki67 of 40%.     PAIN:  Are you having pain? Yes NPRS scale: 1/10 Pain location: at right lateral chest  Pain orientation: Right and Lateral  PAIN TYPE: sore when I touch it  Pain description: sore  Aggravating factors: when she touches it  Relieving factors: massage from her husband helped   PRECAUTIONS: ongoing chemo  WEIGHT BEARING RESTRICTIONS No  FALLS:  Has patient fallen in last 6 months? No  LIVING ENVIRONMENT:Patient lives with: Husband [place] and 17 y.o. daughter Naida Sleight. She has 2 other children (35 y.o. daughter and 30 y.o. son) Lives in: House/apartment Has following equipment at home: None   OCCUPATION: Full time Optometrist and works remotely    [employer]: walks 30 minutes a day , does UE stretching and stregthening    PRIOR LEVEL OF FUNCTION: Independent  PATIENT GOALS :  to keep the cording from getting worse    OBJECTIVE  COGNITION:  Overall cognitive status: Within functional limits for tasks assessed   PALPATION: Pt has several guitar string like cords palpable from going from  scar up toward shoulder possibly along border of pec major muscle   OBSERVATIONS / OTHER ASSESSMENTS: Pt with well healed incsion in left lateral breast toward axilla    POSTURE: forward head and rounded shoulder posture   UPPER EXTREMITY AROM/PROM:  A/PROM RIGHT   eval   Shoulder extension   Shoulder flexion 173  Shoulder abduction 177  Shoulder internal rotation   Shoulder external rotation     (Blank rows = not tested)    CERVICAL AROM: All within normal limits:      UPPER EXTREMITY STRENGTH: WNL    LYMPHEDEMA ASSESSMENTS:  not tested as pt had SOZO screen yesterday and no lymphedema present     TODAY'S TREATMENT  Myofascial release to cording above right scar area into shoulder and along pec major Brief MLD to right shoudler, anterior chest . Tried several positions with pt in supine and sidelying to provoke cording. Pt felt significant stretch in supine with legs rotated to left , upper body rotated to right with right arm at 90 degrees abduction, elbow straight with fingers and wrist exteded  with deep breath to put traction on fascial anterior sling. Pt encouraged to do this slowly up to 5 reps and monitor for response  Made a foam patch of double white foam to wear in compression bra just above incision   PATIENT EDUCATION:  Education details: stretch as above  Person educated: Patient Education method: Explanation, Demonstration, and Tactile cues Education comprehension: verbalized understanding and returned demonstration   HOME EXERCISE PROGRAM: Add stretch as above   ASSESSMENT:  CLINICAL IMPRESSION: Patient is a 51 y.o. female  who was seen today for physical therapy evaluation and treatment for post surgical cording syndrome . She has had an exacerbation of this proximal to scar and wonders if it may be related to chemo (?). She was educated in an upgraded stretch , will continue current exercise and will benefit from manual work to help decrease the  symptoms.  Pt is taking  a flight to [place]. Discussed use of propylactic sleeve ( she has a compression glove) and use of compression bra at elevation She needs a sleeve    OBJECTIVE IMPAIRMENTS increased fascial restrictions.    PARTICIPATION LIMITATIONS: ongoing chemo   PERSONAL FACTORS Past/current experiences are also affecting patient's functional outcome.  Pt has recently completed PT for treatment of similar symptoms   REHAB POTENTIAL: Good  CLINICAL DECISION MAKING: Stable/uncomplicated  EVALUATION COMPLEXITY: Low  GOALS: Goals reviewed with patient? Yes  LONG TERM GOALS: Target date: 09/06/2021    Pt will report that symptoms of cording are decreased by 50% and that she knows how to manage them  Baseline: Goal status: INITIAL  2.  Pt will have a compression sleeve to wear prophylactically for flying and at high elevations  Baseline:  Goal status: INITIAL   PLAN: PT FREQUENCY: 1-2x/week  PT DURATION: 4 weeks  PLANNED INTERVENTIONS: manual techniques, manual lymph drainage, patient  eduction, orthotic fit, therapeutic exercise   PLAN FOR NEXT SESSION:  help arrange for a prophylactic sleeve ( she has a glove ) check for cording, techniques as indicated   Ranessa Kosta K. Owens Shark PT  Norwood Levo, PT 08/09/2021, 11:24 AM

## 2021-08-10 ENCOUNTER — Telehealth: Payer: Self-pay | Admitting: *Deleted

## 2021-08-10 ENCOUNTER — Encounter: Payer: Self-pay | Admitting: Hematology and Oncology

## 2021-08-10 MED ORDER — METHYLPREDNISOLONE 4 MG PO TBPK: ORAL_TABLET | ORAL | 0 refills | Status: DC

## 2021-08-10 NOTE — Telephone Encounter (Signed)
Received call from pt with complaint of red blotchy rash bilateral neck, ears, chest and torso x1 day.  Pt denies fever or recent laundry/soap changes. Pt states rash is raised and itches.  RN reviewed with MD and verbal orders received for pt to be prescribed Medrol Dosepak.  Pt educated on prescription and OTC benadryl 25 mg p.o to alleviate symptoms.  Pt also educated to contact the office if symptoms become worse or do not go away by Friday to be seen in [facility].  Pt verbalized understanding.

## 2021-08-11 ENCOUNTER — Telehealth: Payer: Self-pay

## 2021-08-11 NOTE — Telephone Encounter (Signed)
Return call to pt, she states she is feeling much better and notices that majority of her welts are gone.  She does have flushing of her palms but overall pt feels her medications are helping.

## 2021-08-11 NOTE — Telephone Encounter (Signed)
Return call to pt, spoke with her regarding rash that was discussed via telephone call yesterday.  Pt states the rash is worse and she is very uncomfortable, has not started her medrol dose pack yet as she is asking if this is the correct thing to do.  I reassured pt that per MD she should start on the steroid which will help to calm down this reaction.  I advised pt she could also take benadryl as previously instructed.  Pt told me she took the meds while we were on the phone.  I instructed pt to call us back if there are any further changes but if not I would expect to hear from her this afternoon with updates on how she is doing.  Pt verbalized understanding and thanks

## 2021-08-17 ENCOUNTER — Ambulatory Visit: Payer: BC Managed Care – PPO

## 2021-08-17 DIAGNOSIS — C50411 Malignant neoplasm of upper-outer quadrant of right female breast: Secondary | ICD-10-CM

## 2021-08-17 DIAGNOSIS — R293 Abnormal posture: Secondary | ICD-10-CM

## 2021-08-17 DIAGNOSIS — Z483 Aftercare following surgery for neoplasm: Secondary | ICD-10-CM | POA: Diagnosis not present

## 2021-08-17 NOTE — Therapy (Signed)
OUTPATIENT PHYSICAL THERAPY TREATMENT NOTE   Patient Name: Natasha Ortega MRN: [id number] DOB:08/03/1970, 51 y.o., female Today's Date: 08/17/2021  PCP: Louretta Shorten  REFERRING PROVIDER: Nicholas Lose, MD  END OF SESSION:   PT End of Session - 08/17/21 0904     Visit Number 2    Number of Visits 9    Date for PT Re-Evaluation 09/08/21    PT Start Time 0900    PT Stop Time 0959    PT Time Calculation (min) 59 min    Activity Tolerance Patient tolerated treatment well    Behavior During Therapy [facility] for tasks assessed/performed             Past Medical History:  Diagnosis Date   Cancer ([place])    breast   Headache    migraines   Past Surgical History:  Procedure Laterality Date   BREAST LUMPECTOMY WITH RADIOACTIVE SEED AND SENTINEL LYMPH NODE BIOPSY Right 05/11/2021   Procedure: RIGHT BREAST LUMPECTOMY WITH RADIOACTIVE SEED AND SENTINEL LYMPH NODE BIOPSY;  Surgeon: [name], MD;  Location: [place];  Service: General;  Laterality: Right;   PORTACATH PLACEMENT N/A 06/09/2021   Procedure: INSERTION PORT-A-CATH;  Surgeon: [name], MD;  Location: [facility];  Service: General;  Laterality: N/A;   Patient Active Problem List   Diagnosis Date Noted   Port-A-Cath in place 06/21/2021   Genetic testing 05/09/2021   Family history of breast cancer 05/05/2021   Malignant neoplasm of upper-outer quadrant of right breast in female, estrogen receptor negative ([place]) 04/28/2021    REFERRING DIAG: malignant neoplasm of right breast   THERAPY DIAG:  Aftercare following surgery for neoplasm  Malignant neoplasm of upper-outer quadrant of right breast in female, estrogen receptor negative ([place])  Abnormal posture  Rationale for Evaluation and Treatment Rehabilitation  PERTINENT HISTORY: Patient was diagnosed on 04/28/2021 with right grade 2 invasive ductal carcinoma breast cancer. She underwent a right lumpectomy and sentinel node biopsy on 05/11/2021. She  had 1 node with micromets and 2 negative nodes (3 total) removed. It is ER/PR negative and HER2 negative with a Ki67 of 40%.   PRECAUTIONS:  ongoing chemo   SUBJECTIVE: I'm sorry I missed last week. I had a bad reaction to chemo with welts on my head/neck area that moved down to Rt upper quadrant. The doctor put me on a Z-pack and that helped immediately.  PAIN:  Are you having pain? No   OBJECTIVE:  UPPER EXTREMITY AROM/PROM:   A/PROM RIGHT   eval    Shoulder extension    Shoulder flexion 173  Shoulder abduction 177  Shoulder internal rotation    Shoulder external rotation                            (Blank rows = not tested)       CERVICAL AROM: All within normal limits:          UPPER EXTREMITY STRENGTH: WNL      LYMPHEDEMA ASSESSMENTS:  not tested as pt had SOZO screen yesterday and no lymphedema present        TODAY'S TREATMENT   08/17/21:  Manual Therapy  MFR: At Rt pectoralis insertion where tightness palpable but no cording today found by therapist or pt, despite pts UE in varying positions and with and without trunk rotation.   P/ROM: To Rt shoulder into flexion, abd and D2 to pts available end motions, pt reports  feeling good stretch at pectoralis  STM: To Rt pectoralis insertion where palpably tight 08/09/21: Myofascial release to cording above right scar area into shoulder and along pec major Brief MLD to right shoudler, anterior chest . Tried several positions with pt in supine and sidelying to provoke cording. Pt felt significant stretch in supine with legs rotated to left , upper body rotated to right with right arm at 90 degrees abduction, elbow straight with fingers and wrist exteded  with deep breath to put traction on fascial anterior sling. Pt encouraged to do this slowly up to 5 reps and monitor for response  Made a foam patch of double white foam to wear in compression bra just above incision    PATIENT EDUCATION:  Education details: stretch as above   Person educated: Patient Education method: Explanation, Demonstration, and Tactile cues Education comprehension: verbalized understanding and returned demonstration     HOME EXERCISE PROGRAM: Add stretch as above    ASSESSMENT:   CLINICAL IMPRESSION: Patient returns for manual therapy for her cording that she found a few weeks ago. It was difficult to palpate cording today but she did demonstrate tightness at Rt pectoralis insertion so focused manual techniques here. She just finished a round of steroids and this my have contributed to her cording resolving since here last. Also issued a script for a compression sleeve for her to try to get a sleeve at [facility] as she reports when she was there last it was offered to her and that's where she got her compression gloves and socks during chemo.      OBJECTIVE IMPAIRMENTS increased fascial restrictions.      PARTICIPATION LIMITATIONS: ongoing chemo    PERSONAL FACTORS Past/current experiences are also affecting patient's functional outcome.  Pt has recently completed PT for treatment of similar symptoms    REHAB POTENTIAL: Good   CLINICAL DECISION MAKING: Stable/uncomplicated   EVALUATION COMPLEXITY: Low   GOALS: Goals reviewed with patient? Yes   LONG TERM GOALS: Target date: 09/06/2021     Pt will report that symptoms of cording are decreased by 50% and that she knows how to manage them  Baseline: Goal status: INITIAL   2.  Pt will have a compression sleeve to wear prophylactically for flying and at high elevations  Baseline:  Goal status: INITIAL     PLAN: PT FREQUENCY: 1-2x/week   PT DURATION: 4 weeks   PLANNED INTERVENTIONS: manual techniques, manual lymph drainage, patient  eduction, orthotic fit, therapeutic exercise    PLAN FOR NEXT SESSION:  able to get a compression sleeve at [facility]?, check for cording, techniques as indicated  Otelia Limes, PTA 08/17/2021, 10:05 AM

## 2021-08-22 ENCOUNTER — Inpatient Hospital Stay: Payer: BC Managed Care – PPO

## 2021-08-22 ENCOUNTER — Inpatient Hospital Stay: Payer: BC Managed Care – PPO | Admitting: Hematology and Oncology

## 2021-08-22 MED FILL — Dexamethasone Sodium Phosphate Inj 100 MG/10ML: INTRAMUSCULAR | Qty: 1 | Status: AC

## 2021-08-24 ENCOUNTER — Other Ambulatory Visit: Payer: Self-pay

## 2021-08-24 ENCOUNTER — Inpatient Hospital Stay: Payer: BC Managed Care – PPO

## 2021-08-24 ENCOUNTER — Encounter: Payer: Self-pay | Admitting: *Deleted

## 2021-08-24 ENCOUNTER — Encounter: Payer: Self-pay | Admitting: Adult Health

## 2021-08-24 ENCOUNTER — Inpatient Hospital Stay (HOSPITAL_BASED_OUTPATIENT_CLINIC_OR_DEPARTMENT_OTHER): Payer: BC Managed Care – PPO | Admitting: Adult Health

## 2021-08-24 ENCOUNTER — Inpatient Hospital Stay: Payer: BC Managed Care – PPO | Attending: Hematology and Oncology

## 2021-08-24 VITALS — BP 122/79 | HR 89 | Temp 98.0°F | Resp 18

## 2021-08-24 VITALS — BP 119/91 | HR 88 | Temp 97.5°F | Resp 18 | Ht 64.0 in | Wt 195.6 lb

## 2021-08-24 DIAGNOSIS — C50411 Malignant neoplasm of upper-outer quadrant of right female breast: Secondary | ICD-10-CM

## 2021-08-24 DIAGNOSIS — Z5111 Encounter for antineoplastic chemotherapy: Secondary | ICD-10-CM | POA: Diagnosis not present

## 2021-08-24 DIAGNOSIS — Z5189 Encounter for other specified aftercare: Secondary | ICD-10-CM | POA: Insufficient documentation

## 2021-08-24 DIAGNOSIS — Z79899 Other long term (current) drug therapy: Secondary | ICD-10-CM | POA: Diagnosis not present

## 2021-08-24 DIAGNOSIS — Z171 Estrogen receptor negative status [ER-]: Secondary | ICD-10-CM

## 2021-08-24 DIAGNOSIS — Z95828 Presence of other vascular implants and grafts: Secondary | ICD-10-CM

## 2021-08-24 LAB — CBC WITH DIFFERENTIAL (CANCER CENTER ONLY)
Abs Immature Granulocytes: 0.04 10*3/uL (ref 0.00–0.07)
Basophils Absolute: 0 10*3/uL (ref 0.0–0.1)
Basophils Relative: 0 %
Eosinophils Absolute: 0 10*3/uL (ref 0.0–0.5)
Eosinophils Relative: 0 %
HCT: 34.9 % — ABNORMAL LOW (ref 36.0–46.0)
Hemoglobin: 11.7 g/dL — ABNORMAL LOW (ref 12.0–15.0)
Immature Granulocytes: 0 %
Lymphocytes Relative: 18 %
Lymphs Abs: 2.1 10*3/uL (ref 0.7–4.0)
MCH: 31 pg (ref 26.0–34.0)
MCHC: 33.5 g/dL (ref 30.0–36.0)
MCV: 92.3 fL (ref 80.0–100.0)
Monocytes Absolute: 1.2 10*3/uL — ABNORMAL HIGH (ref 0.1–1.0)
Monocytes Relative: 10 %
Neutro Abs: 8.3 10*3/uL — ABNORMAL HIGH (ref 1.7–7.7)
Neutrophils Relative %: 72 %
Platelet Count: 329 10*3/uL (ref 150–400)
RBC: 3.78 MIL/uL — ABNORMAL LOW (ref 3.87–5.11)
RDW: 16.1 % — ABNORMAL HIGH (ref 11.5–15.5)
WBC Count: 11.6 10*3/uL — ABNORMAL HIGH (ref 4.0–10.5)
nRBC: 0 % (ref 0.0–0.2)

## 2021-08-24 LAB — CMP (CANCER CENTER ONLY)
ALT: 28 U/L (ref 0–44)
AST: 16 U/L (ref 15–41)
Albumin: 3.9 g/dL (ref 3.5–5.0)
Alkaline Phosphatase: 47 U/L (ref 38–126)
Anion gap: 8 (ref 5–15)
BUN: 13 mg/dL (ref 6–20)
CO2: 23 mmol/L (ref 22–32)
Calcium: 9 mg/dL (ref 8.9–10.3)
Chloride: 106 mmol/L (ref 98–111)
Creatinine: 0.88 mg/dL (ref 0.44–1.00)
GFR, Estimated: >60 mL/min (ref >60)
Glucose, Bld: 120 mg/dL — ABNORMAL HIGH (ref 70–99)
Potassium: 3.9 mmol/L (ref 3.5–5.1)
Sodium: 137 mmol/L (ref 135–145)
Total Bilirubin: 0.3 mg/dL (ref 0.3–1.2)
Total Protein: 6.5 g/dL (ref 6.5–8.1)

## 2021-08-24 MED ORDER — SODIUM CHLORIDE 0.9% FLUSH
10.0000 mL | INTRAVENOUS | Status: DC | PRN
  Administered 2021-08-24: 10 mL

## 2021-08-24 MED ORDER — METHYLPREDNISOLONE 4 MG PO TBPK
ORAL_TABLET | ORAL | 0 refills | Status: DC
Start: 2021-08-24 — End: 2021-09-06

## 2021-08-24 MED ORDER — HEPARIN SOD (PORK) LOCK FLUSH 100 UNIT/ML IV SOLN
500.0000 [IU] | Freq: Once | INTRAVENOUS | Status: AC | PRN
  Administered 2021-08-24: 500 [IU]

## 2021-08-24 MED ORDER — SODIUM CHLORIDE 0.9% FLUSH
10.0000 mL | Freq: Once | INTRAVENOUS | Status: AC
  Administered 2021-08-24: 10 mL

## 2021-08-24 MED ORDER — PALONOSETRON HCL INJECTION 0.25 MG/5ML
0.2500 mg | Freq: Once | INTRAVENOUS | Status: AC
  Administered 2021-08-24: 0.25 mg via INTRAVENOUS
  Filled 2021-08-24: qty 5

## 2021-08-24 MED ORDER — SODIUM CHLORIDE 0.9 % IV SOLN
75.0000 mg/m2 | Freq: Once | INTRAVENOUS | Status: AC
  Administered 2021-08-24: 150 mg via INTRAVENOUS
  Filled 2021-08-24: qty 15

## 2021-08-24 MED ORDER — SODIUM CHLORIDE 0.9 % IV SOLN
10.0000 mg | Freq: Once | INTRAVENOUS | Status: AC
  Administered 2021-08-24: 10 mg via INTRAVENOUS
  Filled 2021-08-24: qty 10

## 2021-08-24 MED ORDER — SODIUM CHLORIDE 0.9 % IV SOLN
600.0000 mg/m2 | Freq: Once | INTRAVENOUS | Status: AC
  Administered 2021-08-24: 1180 mg via INTRAVENOUS
  Filled 2021-08-24: qty 59

## 2021-08-24 MED ORDER — SODIUM CHLORIDE 0.9 % IV SOLN: Freq: Once | INTRAVENOUS | Status: AC

## 2021-08-24 NOTE — Assessment & Plan Note (Signed)
04/21/2021:Screening mammogram detected right breast mass 6 mm by ultrasound, axilla negative, biopsy: Grade 2 IDC ER 0%, PR 0%, HER2 2+ by IHC 05/11/2021:Right lumpectomy: Grade 2 IDC 0.9 cm, margins negative, angiolymphatic invasion present, 1 lymph node with micrometastases, 0/2 other lymph nodes negative, ER 0%, PR 0%, HER22+ by IHC and FISH negative with a ratio 1.33Ki-67 25%  Treatment plan: 1.Adjuvant chemotherapy withTaxotere Cytoxan every 3 weeks x4 2.Adjuvant radiation 3.  Discussed Signatera testing ------------------------------------------------------------------------------------------------------------------- Current treatment: Cycle 4 Taxotere and Cytoxan Chemotoxicities: Hives questionably related to shellfish versus late reaction to chemo I have refilled her Medrol Dosepak in case she develops this again.  She will undergo port placement in a couple weeks with Natasha Ortega and is scheduled to see radiation on July 18.  We discussed her upcoming plans and the possibility of Signatera testing which she can discuss with Dr. Lindi Ortega at her follow-up appointment with him towards the end of her radiation therapy.  We reviewed the role of survivorship in her care and she will see survivorship in November 2023.  We will see Natasha Ortega back in medical oncology in September 2023.

## 2021-08-24 NOTE — Patient Instructions (Signed)
[facility] ONCOLOGY  Discharge Instructions: Thank you for choosing [facility] to provide your oncology and hematology care.   If you have a lab appointment with the [facility], please go directly to the [facility] and check in at the registration area.   Wear comfortable clothing and clothing appropriate for easy access to any Portacath or PICC line.   We strive to give you quality time with your provider. You may need to reschedule your appointment if you arrive late (15 or more minutes).  Arriving late affects you and other patients whose appointments are after yours.  Also, if you miss three or more appointments without notifying the office, you may be dismissed from the clinic at the provider's discretion.      For prescription refill requests, have your pharmacy contact our office and allow 72 hours for refills to be completed.    Today you received the following chemotherapy and/or immunotherapy agents: Docetaxel, Cytoxan.      To help prevent nausea and vomiting after your treatment, we encourage you to take your nausea medication as directed.  BELOW ARE SYMPTOMS THAT SHOULD BE REPORTED IMMEDIATELY: *FEVER GREATER THAN 100.4 F (38 C) OR HIGHER *CHILLS OR SWEATING *NAUSEA AND VOMITING THAT IS NOT CONTROLLED WITH YOUR NAUSEA MEDICATION *UNUSUAL SHORTNESS OF BREATH *UNUSUAL BRUISING OR BLEEDING *URINARY PROBLEMS (pain or burning when urinating, or frequent urination) *BOWEL PROBLEMS (unusual diarrhea, constipation, pain near the anus) TENDERNESS IN MOUTH AND THROAT WITH OR WITHOUT PRESENCE OF ULCERS (sore throat, sores in mouth, or a toothache) UNUSUAL RASH, SWELLING OR PAIN  UNUSUAL VAGINAL DISCHARGE OR ITCHING   Items with * indicate a potential emergency and should be followed up as soon as possible or go to the Emergency Department if any problems should occur.  Please show the CHEMOTHERAPY ALERT CARD or IMMUNOTHERAPY ALERT CARD at  check-in to the Emergency Department and triage nurse.  Should you have questions after your visit or need to cancel or reschedule your appointment, please contact [facility]  Dept: [phone]  and follow the prompts.  Office hours are 8:00 a.m. to 4:30 p.m. Monday - Friday. Please note that voicemails left after 4:00 p.m. may not be returned until the following business day.  We are closed weekends and major holidays. You have access to a nurse at all times for urgent questions. Please call the main number to the clinic Dept: [phone] and follow the prompts.   For any non-urgent questions, you may also contact your provider using MyChart. We now offer e-Visits for anyone 41 and older to request care online for non-urgent symptoms. For details visit [email or web address].   Also download the MyChart app! Go to the app store, search "MyChart", open the app, select [facility], and log in with your MyChart username and password.  Masks are optional in the cancer centers. If you would like for your care team to wear a mask while they are taking care of you, please let them know. For doctor visits, patients may have with them one support person who is at least 54 years old. At this time, visitors are not allowed in the infusion area.

## 2021-08-24 NOTE — Progress Notes (Signed)
[facility] Cancer Follow up:    Natasha Shorten, MD [address] [place] [address] [place] [address]   DIAGNOSIS:  Cancer Staging  Malignant neoplasm of upper-outer quadrant of right breast in female, estrogen receptor negative ([place]) Staging form: Breast, AJCC 8th Edition - Pathologic stage from 05/11/2021: Stage IB (pT1b, pN60m, cM0, G2, ER-, PR-, HER2-) - Signed by CGardenia Phlegm NP on 08/03/2021 Stage prefix: Initial diagnosis Histologic grading system: 3 grade system   SUMMARY OF ONCOLOGIC HISTORY: Oncology History  Malignant neoplasm of upper-outer quadrant of right breast in female, estrogen receptor negative ([place]  04/21/2021 Initial Diagnosis   Screening mammogram detected right breast mass 6 mm by ultrasound, axilla negative, biopsy: Grade 2 IDC ER 0%, PR 0%, [facility]  05/11/2021 Surgery   Right lumpectomy: Grade 2 IDC 0.9 cm, margins negative, angiolymphatic invasion present, 1 lymph node with micrometastases, 0/2 other lymph nodes negative, ER 0%, PR 0%, HER2 negative by FISH Ki-67 25% (final pathology is negative for HER2)   05/11/2021 Cancer Staging   Staging form: Breast, AJCC 8th Edition - Pathologic stage from 05/11/2021: Stage IB (pT1b, pN17m cM0, G2, ER-, PR-, HER2-) - Signed by CaGardenia PhlegmNP on 08/03/2021 Stage prefix: Initial diagnosis Histologic grading system: 3 grade system   06/22/2021 -  Chemotherapy   Patient is on Treatment Plan : BREAST TC q21d      Genetic Testing   [facility] CustomNext Panel was Negative. Report date is 07/05/2021.  The CustomNext-Cancer+RNAinsight panel offered by [facility] Fortsncludes sequencing and rearrangement analysis for the following 47 genes:  APC, ATM, AXIN2, BARD1, BMPR1A, BRCA1, BRCA2, BRIP1, CDH1, CDK4, CDKN2A, CHEK2, CTNNA1, DICER1, EPCAM, GREM1, HOXB13, KIT, MEN1, MLH1, MSH2, MSH3, MSH6, MUTYH, NBN, NF1, NTHL1, PALB2, PDGFRA, PMS2, POLD1, POLE, PTEN, RAD50, RAD51C, RAD51D, SDHA, SDHB,  SDHC, SDHD, SMAD4, SMARCA4, STK11, TP53, TSC1, TSC2, and VHL.  RNA data is routinely analyzed for use in variant interpretation for all genes.     CURRENT THERAPY: Taxotere and Cytoxan cycle 4-day 1  INTERVAL HISTORY: Natasha [age][place]89.o. female returns for follow-up prior to receiving her final cycle of adjuvant Taxotere and Cytoxan.  She denies any peripheral neuropathy and tells me that after her third cycle on approximately day 8 she developed some hives.  She is unclear what this was related to but notes that she had shellfish the day before and wonders if it was related to that.  She took a Medrol Dosepak taper and the hives resolved.  Otherwise she is doing well today.   Patient Active Problem List   Diagnosis Date Noted   Port-A-Cath in place 06/21/2021   Genetic testing 05/09/2021   Family history of breast cancer 05/05/2021   Malignant neoplasm of upper-outer quadrant of right breast in female, estrogen receptor negative ([place]03/10/2021    is allergic to penicillins.  MEDICAL HISTORY: Past Medical History:  Diagnosis Date   [facility]   bre[facility]   Headache    migraines    SURGICAL HISTORY: Past Surgical History:  Procedure Laterality Date   BREAST LUMPECTOMY WITH RADIOACTIVE SEED AND SENTINEL LYMPH NODE BIOPSY Right 05/11/2021   Procedure: RIGHT BREAST LUMPECTOMY WITH RADIOACTIVE SEED AND SENTINEL LYMPH NODE BIOPSY;  Surgeon: BlCoralie KeensMD;  [facility] Service: General;  Laterality: Right;   PORTACATH PLACEMENT N/A 06/09/2021   Procedure: INSERTION PORT-A-CATH;  Surgeon: BlCoralie KeensMD;  [facility] Service:  General;  Laterality: N/A;    SOCIAL HISTORY: Social History   Socioeconomic History   Marital status: Married    Spouse name: Not on file   Number of children: Not on file   Years of education: Not on file   Highest education level: Not on file  Occupational History   Not on file  Tobacco Use   Smoking  status: Never   Smokeless tobacco: Never  Vaping Use   Vaping Use: Never used  Substance and Sexual Activity   Alcohol use: Not Currently    Comment: Occasional   Drug use: Never   Sexual activity: Yes    Partners: Male  Other Topics Concern   Not on file  Social History Narrative   Not on file   Social Determinants of Health   Financial Resource Strain: Not on file  Food Insecurity: Not on file  Transportation Needs: Not on file  Physical Activity: Not on file  Stress: Not on file  Social Connections: Not on file  Intimate Partner Violence: Not on file    FAMILY HISTORY: Family History  Problem Relation Age of Onset   Breast cancer Maternal Grandmother 33       dx. again at 55, unsure if recurrence or second primary   Prostate cancer Paternal Grandfather    Breast cancer Other    Brain cancer Other     Review of Systems  Constitutional:  Negative for appetite change, chills, fatigue, fever and unexpected weight change.  HENT:   Negative for hearing loss, lump/mass and trouble swallowing.   Eyes:  Negative for eye problems and icterus.  Respiratory:  Negative for chest tightness, cough and shortness of breath.   Cardiovascular:  Negative for chest pain, leg swelling and palpitations.  Gastrointestinal:  Negative for abdominal distention, abdominal pain, constipation, diarrhea, nausea and vomiting.  Endocrine: Negative for hot flashes.  Genitourinary:  Negative for difficulty urinating.   Musculoskeletal:  Negative for arthralgias.  Skin:  Negative for itching and rash.  Neurological:  Negative for dizziness, extremity weakness, headaches and numbness.  Hematological:  Negative for adenopathy. Does not bruise/bleed easily.  Psychiatric/Behavioral:  Negative for depression. The patient is not nervous/anxious.       PHYSICAL EXAMINATION  ECOG PERFORMANCE STATUS: 1 - Symptomatic but completely ambulatory  Vitals:   08/24/21 1051  BP: (!) 119/91  Pulse: 88   Resp: 18  Temp: (!) 97.5 F (36.4 C)  SpO2: 99%    Physical Exam Constitutional:      General: She is not in acute distress.    Appearance: Normal appearance. She is not toxic-appearing.  HENT:     Head: Normocephalic and atraumatic.  Eyes:     General: No scleral icterus. Cardiovascular:     Rate and Rhythm: Normal rate and regular rhythm.     Pulses: Normal pulses.     Heart sounds: Normal heart sounds.  Pulmonary:     Effort: Pulmonary effort is normal.     Breath sounds: Normal breath sounds.  Abdominal:     General: Abdomen is flat. Bowel sounds are normal. There is no distension.     Palpations: Abdomen is soft.     Tenderness: There is no abdominal tenderness.  Musculoskeletal:        General: No swelling.     Cervical back: Neck supple.  Lymphadenopathy:     Cervical: No cervical adenopathy.  Skin:    General: Skin is warm and dry.  Findings: No rash.  Neurological:     General: No focal deficit present.     Mental Status: She is alert.  Psychiatric:        Mood and Affect: Mood normal.        Behavior: Behavior normal.     LABORATORY DATA:  CBC    Component Value Date/Time   WBC 11.6 (H) 08/24/2021 1034   WBC 5.8 05/11/2021 0850   RBC 3.78 (L) 08/24/2021 1034   HGB 11.7 (L) 08/24/2021 1034   HCT 34.9 (L) 08/24/2021 1034   PLT 329 08/24/2021 1034   MCV 92.3 08/24/2021 1034   MCH 31.0 08/24/2021 1034   MCHC 33.5 08/24/2021 1034   RDW 16.1 (H) 08/24/2021 1034   LYMPHSABS 2.1 08/24/2021 1034   MONOABS 1.2 (H) 08/24/2021 1034   EOSABS 0.0 08/24/2021 1034   BASOSABS 0.0 08/24/2021 1034    CMP     Component Value Date/Time   NA 137 08/24/2021 1034   K 3.9 08/24/2021 1034   CL 106 08/24/2021 1034   CO2 23 08/24/2021 1034   GLUCOSE 120 (H) 08/24/2021 1034   BUN 13 08/24/2021 1034   CREATININE 0.88 08/24/2021 1034   CALCIUM 9.0 08/24/2021 1034   PROT 6.5 08/24/2021 1034   ALBUMIN 3.9 08/24/2021 1034   AST 16 08/24/2021 1034   ALT 28  08/24/2021 1034   ALKPHOS 47 08/24/2021 1034   BILITOT 0.3 08/24/2021 1034   GFRNONAA >60 08/24/2021 1034         ASSESSMENT and THERAPY PLAN:   Malignant neoplasm of upper-outer quadrant of right breast in female, estrogen receptor negative ([place]) 04/21/2021:Screening mammogram detected right breast mass 6 mm by ultrasound, axilla negative, biopsy: Grade 2 IDC ER 0%, PR 0%, HER2 2+ by IHC 05/11/2021:Right lumpectomy: Grade 2 IDC 0.9 cm, margins negative, angiolymphatic invasion present, 1 lymph node with micrometastases, 0/2 other lymph nodes negative, ER 0%, PR 0%, HER2 2+ by IHC and FISH negative with a ratio 1.33 Ki-67 25%   Treatment plan: 1.  Adjuvant chemotherapy with Taxotere Cytoxan every 3 weeks x4 2.  Adjuvant radiation 3.  Discussed Signatera testing ------------------------------------------------------------------------------------------------------------------- Current treatment: Cycle 4 Taxotere and Cytoxan Chemotoxicities: Hives questionably related to shellfish versus late reaction to chemo I have refilled her Medrol Dosepak in case she develops this again.  She will undergo port placement in a couple weeks with Dr. Ninfa Linden and is scheduled to see radiation on July 18.  We discussed her upcoming plans and the possibility of Signatera testing which she can discuss with Dr. Lindi Adie at her follow-up appointment with him towards the end of her radiation therapy.  We reviewed the role of survivorship in her care and she will see survivorship in November 2023.  We will see Micaiah back in medical oncology in September 2023.   All questions were answered. The patient knows to call the clinic with any problems, questions or concerns. We can certainly see the patient much sooner if necessary.  Total encounter time:20 minutes*in face-to-face visit time, chart review, lab review, care coordination, order entry, and documentation of the encounter time.    Wilber Bihari, NP  08/24/21 12:30 PM Medical Oncology and Hematology [facility] [place], [place] [address] Tel. [phone]    Fax. [phone]  *Total Encounter Time as defined by the Centers for Medicare and Medicaid Services includes, in addition to the face-to-face time of a patient visit (documented in the note above) non-face-to-face time: obtaining  and reviewing outside history, ordering and reviewing medications, tests or procedures, care coordination (communications with other health care professionals or caregivers) and documentation in the medical record.

## 2021-08-25 ENCOUNTER — Telehealth: Payer: Self-pay | Admitting: Adult Health

## 2021-08-25 ENCOUNTER — Ambulatory Visit: Payer: BC Managed Care – PPO

## 2021-08-25 NOTE — Telephone Encounter (Signed)
Scheduled appointment per 7/5 los. Left message.

## 2021-08-26 ENCOUNTER — Other Ambulatory Visit: Payer: Self-pay

## 2021-08-26 ENCOUNTER — Inpatient Hospital Stay: Payer: BC Managed Care – PPO

## 2021-08-26 VITALS — BP 113/68 | HR 92 | Temp 97.7°F | Resp 18

## 2021-08-26 DIAGNOSIS — Z5111 Encounter for antineoplastic chemotherapy: Secondary | ICD-10-CM | POA: Diagnosis not present

## 2021-08-26 DIAGNOSIS — Z171 Estrogen receptor negative status [ER-]: Secondary | ICD-10-CM

## 2021-08-26 MED ORDER — PEGFILGRASTIM-CBQV 6 MG/0.6ML ~~LOC~~ SOSY
6.0000 mg | PREFILLED_SYRINGE | Freq: Once | SUBCUTANEOUS | Status: AC
  Administered 2021-08-26: 6 mg via SUBCUTANEOUS
  Filled 2021-08-26: qty 0.6

## 2021-08-29 NOTE — Progress Notes (Signed)
Documentation Encounter:   Upon review of pt charges Docetaxel was not crossing over. Changes needed to be made to infusion encounter per [facility]. Although Docetaxel was scanned, initial start of Docetaxel crossed over as a Rate/Dose Change instead of New Bag. Documentation corrected 08/29/2021. Since medication was given on 5/3, barcode scanner overridden during correction. Medication administered of 5/3 with double-RN verification according to unit protocol.

## 2021-09-01 ENCOUNTER — Ambulatory Visit: Payer: BC Managed Care – PPO | Attending: Hematology and Oncology

## 2021-09-01 DIAGNOSIS — C50411 Malignant neoplasm of upper-outer quadrant of right female breast: Secondary | ICD-10-CM | POA: Insufficient documentation

## 2021-09-01 DIAGNOSIS — Z171 Estrogen receptor negative status [ER-]: Secondary | ICD-10-CM | POA: Insufficient documentation

## 2021-09-01 DIAGNOSIS — R293 Abnormal posture: Secondary | ICD-10-CM | POA: Diagnosis present

## 2021-09-01 DIAGNOSIS — Z483 Aftercare following surgery for neoplasm: Secondary | ICD-10-CM | POA: Diagnosis not present

## 2021-09-01 NOTE — Therapy (Signed)
OUTPATIENT PHYSICAL THERAPY TREATMENT NOTE   Patient Name: Natasha Ortega MRN: [id number] DOB:03/16/1970, 51 y.o., female Today's Date: 09/01/2021  PCP: Louretta Shorten  REFERRING PROVIDER: Nicholas Lose, MD  END OF SESSION:   PT End of Session - 09/01/21 1107     Visit Number 3    Number of Visits 9    Date for PT Re-Evaluation 09/08/21    PT Start Time 1104    PT Stop Time 1201    PT Time Calculation (min) 57 min    Activity Tolerance Patient tolerated treatment well    Behavior During Therapy [facility] for tasks assessed/performed             Past Medical History:  Diagnosis Date   Cancer ([place])    breast   Headache    migraines   Past Surgical History:  Procedure Laterality Date   BREAST LUMPECTOMY WITH RADIOACTIVE SEED AND SENTINEL LYMPH NODE BIOPSY Right 05/11/2021   Procedure: RIGHT BREAST LUMPECTOMY WITH RADIOACTIVE SEED AND SENTINEL LYMPH NODE BIOPSY;  Surgeon: [name], MD;  Location: [place];  Service: General;  Laterality: Right;   PORTACATH PLACEMENT N/A 06/09/2021   Procedure: INSERTION PORT-A-CATH;  Surgeon: [name], MD;  Location: [place];  Service: General;  Laterality: N/A;   Patient Active Problem List   Diagnosis Date Noted   Port-A-Cath in place 06/21/2021   Genetic testing 05/09/2021   Family history of breast cancer 05/05/2021   Malignant neoplasm of upper-outer quadrant of right breast in female, estrogen receptor negative ([place]) 04/28/2021    REFERRING DIAG: malignant neoplasm of right breast   THERAPY DIAG:  Aftercare following surgery for neoplasm  Malignant neoplasm of upper-outer quadrant of right breast in female, estrogen receptor negative ([place])  Abnormal posture  Rationale for Evaluation and Treatment Rehabilitation  PERTINENT HISTORY: Patient was diagnosed on 04/28/2021 with right grade 2 invasive ductal carcinoma breast cancer. She underwent a right lumpectomy and sentinel node biopsy on 05/11/2021. She  had 1 node with micromets and 2 negative nodes (3 total) removed. It is ER/PR negative and HER2 negative with a Ki67 of 40%.   PRECAUTIONS:  ongoing chemo   SUBJECTIVE: I got measured for my compression sleeve but she has to order it as she didn't have my size. I had my last chemo last week and have just been more tired than usual.   PAIN:  Are you having pain? No   OBJECTIVE:  UPPER EXTREMITY AROM/PROM:   A/PROM RIGHT   eval    Shoulder extension    Shoulder flexion 173  Shoulder abduction 177  Shoulder internal rotation    Shoulder external rotation                            (Blank rows = not tested)       CERVICAL AROM: All within normal limits:          UPPER EXTREMITY STRENGTH: WNL      LYMPHEDEMA ASSESSMENTS:  not tested as pt had SOZO screen yesterday and no lymphedema present        TODAY'S TREATMENT  09/01/21: Manual Therapy MFR: At Rt pectoralis insertion where tightness palpable over cording that was present today, this was much improved by end of session  P/ROM: To Rt shoulder into flexion, abd and D2 to pts available end motions, pt reports feeling good stretch at pectoralis STM: To Rt pectoralis insertion where palpably tight  08/17/21: Manual Therapy MFR: At Rt pectoralis insertion where tightness palpable but no cording today found by therapist or pt, despite pts UE in varying positions and with and without trunk rotation.  P/ROM: To Rt shoulder into flexion, abd and D2 to pts available end motions, pt reports feeling good stretch at pectoralis STM: To Rt pectoralis insertion where palpably tight  08/09/21: Myofascial release to cording above right scar area into shoulder and along pec major Brief MLD to right shoudler, anterior chest . Tried several positions with pt in supine and sidelying to provoke cording. Pt felt significant stretch in supine with legs rotated to left , upper body rotated to right with right arm at 90 degrees abduction, elbow  straight with fingers and wrist exteded  with deep breath to put traction on fascial anterior sling. Pt encouraged to do this slowly up to 5 reps and monitor for response  Made a foam patch of double white foam to wear in compression bra just above incision    PATIENT EDUCATION:  Education details: stretch as above  Person educated: Patient Education method: Explanation, Demonstration, and Tactile cues Education comprehension: verbalized understanding and returned demonstration     HOME EXERCISE PROGRAM: Add stretch as above    ASSESSMENT:   CLINICAL IMPRESSION: Pt returns with cording palpable today since her last chemo. This did improve well with manual therapy by end of session. She has one visit next week then will be in hold for vacation. Plans to return for assess after she gets back to see if we will need to cont treatment during radiation that will be due to start that same week.      OBJECTIVE IMPAIRMENTS increased fascial restrictions.      PARTICIPATION LIMITATIONS: ongoing chemo    PERSONAL FACTORS Past/current experiences are also affecting patient's functional outcome.  Pt has recently completed PT for treatment of similar symptoms    REHAB POTENTIAL: Good   CLINICAL DECISION MAKING: Stable/uncomplicated   EVALUATION COMPLEXITY: Low   GOALS: Goals reviewed with patient? Yes   LONG TERM GOALS: Target date: 09/06/2021     Pt will report that symptoms of cording are decreased by 50% and that she knows how to manage them  Baseline: Goal status: INITIAL   2.  Pt will have a compression sleeve to wear prophylactically for flying and at high elevations  Baseline:  Goal status: INITIAL     PLAN: PT FREQUENCY: 1-2x/week   PT DURATION: 4 weeks   PLANNED INTERVENTIONS: manual techniques, manual lymph drainage, patient  eduction, orthotic fit, therapeutic exercise    PLAN FOR NEXT SESSION:  check for cording, techniques as indicated  Otelia Limes,  PTA 09/01/2021, 12:03 PM

## 2021-09-05 ENCOUNTER — Ambulatory Visit: Payer: Self-pay | Admitting: Genetic Counselor

## 2021-09-05 DIAGNOSIS — Z1379 Encounter for other screening for genetic and chromosomal anomalies: Secondary | ICD-10-CM

## 2021-09-05 NOTE — Progress Notes (Signed)
HPI:   Natasha Ortega was previously seen in the [facility] clinic due to a personal and family history of cancer and concerns regarding a hereditary predisposition to cancer. Please refer to our prior cancer genetics clinic note for more information regarding our discussion, assessment and recommendations, at the time. Natasha Ortega recent genetic test results were disclosed to her, as were recommendations warranted by these results. These results and recommendations are discussed in more detail below.  CANCER HISTORY:  Oncology History  Malignant neoplasm of upper-outer quadrant of right breast in female, estrogen receptor negative ([place])  04/21/2021 Initial Diagnosis   Screening mammogram detected right breast mass 6 mm by ultrasound, axilla negative, biopsy: Grade 2 IDC ER 0%, PR 0%, HER2 2+ by [facility]   05/11/2021 Surgery   Right lumpectomy: Grade 2 IDC 0.9 cm, margins negative, angiolymphatic invasion present, 1 lymph node with micrometastases, 0/2 other lymph nodes negative, ER 0%, PR 0%, HER2 negative by FISH Ki-67 25% (final pathology is negative for HER2)   05/11/2021 Cancer Staging   Staging form: Breast, AJCC 8th Edition - Pathologic stage from 05/11/2021: Stage IB (pT1b, pN75mi, cM0, G2, ER-, PR-, HER2-) - Signed by Gardenia Phlegm, NP on 08/03/2021 Stage prefix: Initial diagnosis Histologic grading system: 3 grade system   06/22/2021 -  Chemotherapy   Patient is on Treatment Plan : BREAST TC q21d      Genetic Testing   [facility] CustomNext Panel was Negative. Report date is 07/05/2021.  The CustomNext-Cancer+RNAinsight panel offered by [employer] includes sequencing and rearrangement analysis for the following 47 genes:  APC, ATM, AXIN2, BARD1, BMPR1A, BRCA1, BRCA2, BRIP1, CDH1, CDK4, CDKN2A, CHEK2, CTNNA1, DICER1, EPCAM, GREM1, HOXB13, KIT, MEN1, MLH1, MSH2, MSH3, MSH6, MUTYH, NBN, NF1, NTHL1, PALB2, PDGFRA, PMS2, POLD1, POLE, PTEN, RAD50, RAD51C, RAD51D, SDHA, SDHB,  SDHC, SDHD, SMAD4, SMARCA4, STK11, TP53, TSC1, TSC2, and VHL.  RNA data is routinely analyzed for use in variant interpretation for all genes.     FAMILY HISTORY:  We obtained a detailed, 4-generation family history.  Significant diagnoses are listed below:      Family History  Problem Relation Age of Onset   Breast cancer Maternal Grandmother 9        dx. again at 30, unsure if recurrence or second primary   Prostate cancer Paternal Grandfather     Breast cancer Other     Brain cancer Other           Natasha Ortega's maternal grandmother was diagnosed with breast cancer at age 41 and again at age 22, she is unsure if the second breast cancer was a recurrence or a second primary. Her paternal half-aunt was diagnosed with breast cancer at an unknown age, she is deceased. A second paternal half-aunt was diagnosed with brain cancer at an unknown age, she is deceased. Her paternal grandfather was diagnosed with prostate cancer later in life, he is deceased. There is no reported Ashkenazi Jewish ancestry.   GENETIC TEST RESULTS:  The [employer] CustomNext Panel found no pathogenic mutations.  The CustomNext-Cancer+RNAinsight panel offered by [facility] F[facility] includes sequencing and rearrangement analysis for the following 47 genes:  APC, ATM, AXIN2, BARD1, BMPR1A, BRCA1, BRCA2, BRIP1, CDH1, CDK4, CDKN2A, CHEK2, CTNNA1, DICER1, EPCAM, GREM1, HOXB13, KIT, MEN1, MLH1, MSH2, MSH3, MSH6, MUTYH, NBN, NF1, NTHL1, PALB2, PDGFRA, PMS2, POLD1, POLE, PTEN, RAD50, RAD51C, RAD51D, SDHA, SDHB, SDHC, SDHD, SMAD4, SMARCA4, STK11, TP53, TSC1, TSC2, and VHL.  RNA data is routinely analyzed for use in variant  interpretation for all genes.  The test report has been scanned into EPIC and is located under the Molecular Pathology section of the Results Review tab.  A portion of the result report is included below for reference. Genetic testing reported out on 07/05/2021.       Even though a pathogenic variant was not  identified, possible explanations for the cancer in the family may include: There may be no hereditary risk for cancer in the family. The cancers in Natasha Ortega and/or her family may be due to other genetic or environmental factors. There may be a gene mutation in one of these genes that current testing methods cannot detect, but that chance is small. There could be another gene that has not yet been discovered, or that we have not yet tested, that is responsible for the cancer diagnoses in the family.  It is also possible there is a hereditary cause for the cancer in the family that Natasha Ortega did not inherit.  Therefore, it is important to remain in touch with cancer genetics in the future so that we can continue to offer Natasha Ortega the most up to date genetic testing.   ADDITIONAL GENETIC TESTING:  We discussed with Natasha Ortega that her genetic testing was fairly extensive.  If there are genes identified to increase cancer risk that can be analyzed in the future, we would be happy to discuss and coordinate this testing at that time.    CANCER SCREENING RECOMMENDATIONS:  Natasha Ortega test result is considered negative (normal).  This means that we have not identified a hereditary cause for her personal and family history of cancer at this time.   An individual's cancer risk and medical management are not determined by genetic test results alone. Overall cancer risk assessment incorporates additional factors, including personal medical history, family history, and any available genetic information that may result in a personalized plan for cancer prevention and surveillance. Therefore, it is recommended she continue to follow the cancer management and screening guidelines provided by her oncology and primary healthcare provider.  RECOMMENDATIONS FOR FAMILY MEMBERS:   Since she did not inherit a mutation in a cancer predisposition gene included on this panel, her children could not have inherited a  mutation from her in one of these genes.  FOLLOW-UP:  Cancer genetics is a rapidly advancing field and it is possible that new genetic tests will be appropriate for her and/or her family members in the future. We encouraged her to remain in contact with cancer genetics on an annual basis so we can update her personal and family histories and let her know of advances in cancer genetics that may benefit this family.   Our contact number was provided. Natasha Ortega questions were answered to her satisfaction, and she knows she is welcome to call us at anytime with additional questions or concerns.   Lucille Passy, MS, [facility] Genetic Counselor [email or web address] (P) [phone]

## 2021-09-05 NOTE — Progress Notes (Signed)
Radiation Oncology         [phone] ________________________________  Name: Natasha Ortega        MRN: [id number]  Date of Service: 09/06/2021 DOB: 1970/06/14  VW:[id number], Shanon Brow, MD  Nicholas Lose, MD     REFERRING PHYSICIAN: Nicholas Lose, MD   DIAGNOSIS: The encounter diagnosis was Malignant neoplasm of upper-outer quadrant of right breast in female, estrogen receptor negative ([place]).   HISTORY OF PRESENT ILLNESS: Natasha Ortega is a 51 y.o. female with a diagnosis of right breast cancer.  The patient was found on screening mammogram to have an asymmetry in the right breast confirmed in the upper outer quadrant by diagnostic mammogram.  By ultrasound this was found in the 10 o'clock position measuring 6 mm in her right axilla was negative for adenopathy.  She underwent a biopsy on 04/21/2021 which showed a grade 3 invasive ductal carcinoma that was ER/PR negative, HER2 was positive and Ki-67 is 40%.    She underwent a right lumpectomy with sentinel node biopsy on 05/11/21 that showed a grade 2 invasive ductal carcinoma measuring 9 mm. Her margins were negative, the closest 1 mm to the anterior margin, and 1 of 3 sampled axillary nodes was also positive for micrometastatic disease. HER2 on final pathology was negative.  She began systemic chemotherapy for triple negative disease on 06/22/21 and she completed chemotherapy on 08/24/21. She is seen today to discuss adjuvant radiotherapy.   PREVIOUS RADIATION THERAPY: No   PAST MEDICAL HISTORY:  Past Medical History:  Diagnosis Date   Cancer ([place])    breast   Headache    migraines       PAST SURGICAL HISTORY: Past Surgical History:  Procedure Laterality Date   BREAST LUMPECTOMY WITH RADIOACTIVE SEED AND SENTINEL LYMPH NODE BIOPSY Right 05/11/2021   Procedure: RIGHT BREAST LUMPECTOMY WITH RADIOACTIVE SEED AND SENTINEL LYMPH NODE BIOPSY;  Surgeon: [name], MD;  Location: [place];  Service: General;  Laterality: Right;   PORTACATH  PLACEMENT N/A 06/09/2021   Procedure: INSERTION PORT-A-CATH;  Surgeon: [name], MD;  Location: [place];  Service: General;  Laterality: N/A;     FAMILY HISTORY:  Family History  Problem Relation Age of Onset   Breast cancer Maternal Grandmother 71       dx. again at 50, unsure if recurrence or second primary   Prostate cancer Paternal Grandfather    Breast cancer Other    Brain cancer Other      SOCIAL HISTORY:  Social History   Socioeconomic History   Marital status: Married    Spouse name: Not on file   Number of children: Not on file   Years of education: Not on file   Highest education level: Not on file  Occupational History   Not on file  Tobacco Use   Smoking status: Never   Smokeless tobacco: Never  Vaping Use   Vaping Use: Never used  Substance and Sexual Activity   Alcohol use: Not Currently    Comment: Occasional   Drug use: Never   Sexual activity: Yes    Partners: Male  Other Topics Concern   Not on file  Social History Narrative   Not on file   Social Determinants of Health   Financial Resource Strain: Not on file  Food Insecurity: Not on file  Transportation Needs: Not on file  Physical Activity: Not on file  Stress: Not on file  Social Connections: Not on file  Intimate Partner  Violence: Not on file  She is married and lives in [place].She is very active in her children's activities and is very active in her teenage daughter's soccer activities and 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    ALLERGIES: Penicillins   MEDICATIONS:  Current Outpatient Medications  Medication Sig Dispense Refill   dexamethasone (DECADRON) 4 MG tablet Take 1 tablet (4 mg total) by mouth 2 (two) times daily. Take 1 tablet day before chemo and 1 tablet day after chemo with food 8 tablet 0   fluconazole (DIFLUCAN) 100 MG tablet Take 1 tablet (100 mg total) by mouth daily. 10 tablet 0   ibuprofen (ADVIL) 200 MG tablet Take by mouth.      lidocaine-prilocaine (EMLA) cream Apply to affected area once 30 g 3   methylPREDNISolone (MEDROL DOSEPAK) 4 MG TBPK tablet Take as directed 21 tablet 0   Multiple Vitamins-Minerals (MULTIVITAMIN WITH MINERALS) tablet Take 1 tablet by mouth daily.     ondansetron (ZOFRAN) 8 MG tablet Take 1 tablet (8 mg total) by mouth 2 (two) times daily as needed for refractory nausea / vomiting. Start on day 3 after chemo. 30 tablet 1   prochlorperazine (COMPAZINE) 10 MG tablet Take 1 tablet (10 mg total) by mouth every 6 (six) hours as needed (Nausea or vomiting). 30 tablet 1   traMADol (ULTRAM) 50 MG tablet Take 1 tablet (50 mg total) by mouth every 6 (six) hours as needed for moderate pain. 25 tablet 0   No current facility-administered medications for this visit.     REVIEW OF SYSTEMS: On review of systems, the patient reports that she is doing well overall. She's glad to be finished with chemotherapy. She reports that she is working with PT regarding cording of the pectoralis and that working on this has helped cording also lower down in the breast. She has some tenderness at times in the breast, but no other complaints are verbalized.      PHYSICAL EXAM:  Wt Readings from Last 3 Encounters:  08/24/21 195 lb 9.6 oz (88.7 kg)  08/03/21 192 lb 11.2 oz (87.4 kg)  07/13/21 191 lb 4.8 oz (86.8 kg)   Temp Readings from Last 3 Encounters:  08/26/21 97.7 F (36.5 C) (Oral)  08/24/21 98 F (36.7 C) (Oral)  08/24/21 (!) 97.5 F (36.4 C) (Temporal)   BP Readings from Last 3 Encounters:  08/26/21 113/68  08/24/21 122/79  08/24/21 (!) 119/91   Pulse Readings from Last 3 Encounters:  08/26/21 92  08/24/21 89  08/24/21 88    In general this is a well appearing caucasian female in no acute distress. She's alert and oriented x4 and appropriate throughout the examination. Cardiopulmonary assessment is negative for acute distress and she exhibits normal effort. The right breast shows a well healed  surgical incision site without erythema, separation, or drainage. Palpable cording is noted in the UOQ of the breast extending to the base of the right axilla.    ECOG = 1  0 - Asymptomatic (Fully active, able to carry on all predisease activities without restriction)  1 - Symptomatic but completely ambulatory (Restricted in physically strenuous activity but ambulatory and able to carry out work of a light or sedentary nature. For example, light housework, office work)  2 - Symptomatic, <50% in bed during the day (Ambulatory and capable of all self care but unable to carry out any work activities. Up and about more than 50% of waking hours)  3 - Symptomatic, >50% in bed, but not  bedbound (Capable of only limited self-care, confined to bed or chair 50% or more of waking hours)  4 - Bedbound (Completely disabled. Cannot carry on any self-care. Totally confined to bed or chair)  5 - Death   Eustace Pen MM, Creech RH, Tormey DC, et al. [phone]). "Toxicity and response criteria of the [facility]". [place] Oncol. 5 (6): 649-55    LABORATORY DATA:  Lab Results  Component Value Date   WBC 11.6 (H) 08/24/2021   HGB 11.7 (L) 08/24/2021   HCT 34.9 (L) 08/24/2021   MCV 92.3 08/24/2021   PLT 329 08/24/2021   Lab Results  Component Value Date   NA 137 08/24/2021   K 3.9 08/24/2021   CL 106 08/24/2021   CO2 23 08/24/2021   Lab Results  Component Value Date   ALT 28 08/24/2021   AST 16 08/24/2021   ALKPHOS 47 08/24/2021   BILITOT 0.3 08/24/2021      RADIOGRAPHY: No results found.     IMPRESSION/PLAN: 1. Stage IIA, pT1bN1M0, grade 2, triple negative invasive ductal carcinoma of the right breast. Dr. Lisbeth Renshaw has reviewed her final pathology findings and I reviewed the nature of early stage breast disease. She has done well since surgery and has completed systemic therapy. We reviewed the rationale for external radiotherapy to the breast  to reduce risks of local  recurrence. We discussed the risks, benefits, short, and long term effects of radiotherapy, as well as the curative intent, and the patient is interested in proceeding. I reviewed the delivery and logistics of radiotherapy and that I anticipate the recommendation will be at least 4 weeks of radiotherapy to the right breast with high tangent coverage into the right axilla. If Dr. Lisbeth Renshaw feels that she is better suited based on the node positivity and her triple negative status, I will let her know next week. Written consent is obtained and placed in the chart, a copy was provided to the patient. She will simulate on 09/23/21 and we plan to start therapy on 10/10/21 due to a previously planned vacation.    In a visit lasting 45 minutes, greater than 50% of the time was spent face to face reviewing her case, as well as in preparation of, discussing, and coordinating the patient's care.     Carola Rhine, [facility]    **Disclaimer: This note was dictated with voice recognition software. Similar sounding words can inadvertently be transcribed and this note may contain transcription errors which may not have been corrected upon publication of note.**

## 2021-09-06 ENCOUNTER — Ambulatory Visit: Payer: BC Managed Care – PPO

## 2021-09-06 ENCOUNTER — Ambulatory Visit
Admission: RE | Admit: 2021-09-06 | Discharge: 2021-09-06 | Disposition: A | Discharge status: Home / Self Care | Payer: BC Managed Care – PPO | Source: Ambulatory Visit | Attending: Radiation Oncology | Admitting: Radiation Oncology

## 2021-09-06 ENCOUNTER — Encounter: Payer: Self-pay | Admitting: Radiation Oncology

## 2021-09-06 ENCOUNTER — Ambulatory Visit
Admission: RE | Admit: 2021-09-06 | Discharge: 2021-09-06 | Disposition: A | Discharge status: Home / Self Care | Payer: BC Managed Care – PPO | Source: Ambulatory Visit

## 2021-09-06 ENCOUNTER — Other Ambulatory Visit: Payer: Self-pay | Admitting: *Deleted

## 2021-09-06 ENCOUNTER — Encounter (HOSPITAL_BASED_OUTPATIENT_CLINIC_OR_DEPARTMENT_OTHER): Payer: Self-pay | Admitting: Surgery

## 2021-09-06 ENCOUNTER — Other Ambulatory Visit: Payer: Self-pay

## 2021-09-06 VITALS — BP 111/87 | HR 105 | Temp 97.2°F | Resp 20 | Ht 64.5 in | Wt 193.4 lb

## 2021-09-06 DIAGNOSIS — R519 Headache, unspecified: Secondary | ICD-10-CM | POA: Insufficient documentation

## 2021-09-06 DIAGNOSIS — Z171 Estrogen receptor negative status [ER-]: Secondary | ICD-10-CM

## 2021-09-06 DIAGNOSIS — C50411 Malignant neoplasm of upper-outer quadrant of right female breast: Secondary | ICD-10-CM | POA: Insufficient documentation

## 2021-09-06 DIAGNOSIS — Z803 Family history of malignant neoplasm of breast: Secondary | ICD-10-CM | POA: Diagnosis not present

## 2021-09-06 DIAGNOSIS — Z7952 Long term (current) use of systemic steroids: Secondary | ICD-10-CM | POA: Diagnosis not present

## 2021-09-06 MED ORDER — RADIAPLEXRX EX GEL: Freq: Once | CUTANEOUS | Status: AC

## 2021-09-06 MED ORDER — ALRA NON-METALLIC DEODORANT (RAD-ONC)
1.0000 | Freq: Once | TOPICAL | Status: AC
  Administered 2021-09-06: 1 via TOPICAL

## 2021-09-06 NOTE — Progress Notes (Signed)
Follow-up-new appointment. I verified patient's identity and began nursing interview w/ spouse Mr. Mali Mermelstein in attendance. No RT breast discomfort at this time. Incision line healing well w/o tenderness.  Perimenopausal- Patient states "No chances of pregnancy."  BP 111/87 (BP Location: Left Arm, Patient Position: Sitting, Cuff Size: Normal)   Pulse (!) 105   Temp (!) 97.2 F (36.2 C) (Temporal)   Resp 20   Ht 5' 4.5" (1.638 m)   Wt 193 lb 6.4 oz (87.7 kg)   SpO2 99%   BMI 32.68 kg/m

## 2021-09-06 NOTE — Progress Notes (Signed)
Pt here for patient teaching.  Pt given Radiation and You booklet, skin care instructions, alra deodorant and Radiaplex.    Reviewed areas of pertinence such as fatigue, hair loss, skin changes, breast tenderness, and breast swelling. Pt able to give teach back of to pat skin and use unscented/gentle soap,apply Radiaplex bid, avoid applying anything to skin within 4 hours of treatment, avoid wearing an under wire bra, and to use an electric razor if they must shave. Pt verbalizes understanding of information given and will contact nursing with any questions or concerns.    Davita Sublett M. Kristelle Cavallaro RN, BSN  

## 2021-09-07 NOTE — Addendum Note (Signed)
Encounter addended by: Hayden Pedro, PA-C on: 09/07/2021 9:58 AM  Actions taken: Clinical Note Signed

## 2021-09-08 ENCOUNTER — Ambulatory Visit: Payer: BC Managed Care – PPO

## 2021-09-08 DIAGNOSIS — C50411 Malignant neoplasm of upper-outer quadrant of right female breast: Secondary | ICD-10-CM

## 2021-09-08 DIAGNOSIS — R293 Abnormal posture: Secondary | ICD-10-CM

## 2021-09-08 DIAGNOSIS — Z483 Aftercare following surgery for neoplasm: Secondary | ICD-10-CM

## 2021-09-08 NOTE — Therapy (Signed)
OUTPATIENT PHYSICAL THERAPY TREATMENT NOTE   Patient Name: Natasha Ortega MRN: [id number] DOB:24-Jun-1970, 51 y.o., female Today's Date: 09/08/2021  PCP: Louretta Shorten  REFERRING PROVIDER: Nicholas Lose, MD  END OF SESSION:   PT End of Session - 09/08/21 0907     Visit Number 4    Number of Visits 9    Date for PT Re-Evaluation 09/08/21    PT Start Time 0904    PT Stop Time 1003    PT Time Calculation (min) 59 min    Activity Tolerance Patient tolerated treatment well    Behavior During Therapy [facility] for tasks assessed/performed             Past Medical History:  Diagnosis Date   Cancer ([place]) 04/21/2021   right breast IDC   Headache    migraines   Past Surgical History:  Procedure Laterality Date   BREAST LUMPECTOMY WITH RADIOACTIVE SEED AND SENTINEL LYMPH NODE BIOPSY Right 05/11/2021   Procedure: RIGHT BREAST LUMPECTOMY WITH RADIOACTIVE SEED AND SENTINEL LYMPH NODE BIOPSY;  Surgeon: [name], MD;  Location: [place];  Service: General;  Laterality: Right;   PORTACATH PLACEMENT N/A 06/09/2021   Procedure: INSERTION PORT-A-CATH;  Surgeon: [name], MD;  Location: [place];  Service: General;  Laterality: N/A;   Patient Active Problem List   Diagnosis Date Noted   Port-A-Cath in place 06/21/2021   Genetic testing 05/09/2021   Family history of breast cancer 05/05/2021   Malignant neoplasm of upper-outer quadrant of right breast in female, estrogen receptor negative ([place]) 04/28/2021    REFERRING DIAG: malignant neoplasm of right breast   THERAPY DIAG:  Aftercare following surgery for neoplasm  Malignant neoplasm of upper-outer quadrant of right breast in female, estrogen receptor negative ([place])  Abnormal posture  Rationale for Evaluation and Treatment Rehabilitation  PERTINENT HISTORY: Patient was diagnosed on 04/28/2021 with right grade 2 invasive ductal carcinoma breast cancer. She underwent a right lumpectomy and sentinel node biopsy  on 05/11/2021. She had 1 node with micromets and 2 negative nodes (3 total) removed. It is ER/PR negative and HER2 negative with a Ki67 of 40%.   PRECAUTIONS:  ongoing chemo   SUBJECTIVE: I've noticed some increased tenderness in my armpit in the past few days that's had me nervous. (Encouraged pt tenderness in the surgical field is normal). I saw Shona Simpson since I was here last and she said I might need up to 6.5 weeks of radiation and they might be radiating my lymph nodes at my axilla and my subclavian. She thinks that despite only having 1 positive node that may be the suggested treatment due to my cancer being triple negative. But Dr. Lisbeth Renshaw is on vacation this week and my simulation is Aug 4 so I'll get my questions answered then.  PAIN:  Are you having pain? No   OBJECTIVE:  UPPER EXTREMITY AROM/PROM:   A/PROM RIGHT   eval    Shoulder extension    Shoulder flexion 173  Shoulder abduction 177  Shoulder internal rotation    Shoulder external rotation                            (Blank rows = not tested)       CERVICAL AROM: All within normal limits:          UPPER EXTREMITY STRENGTH: WNL      LYMPHEDEMA ASSESSMENTS:  not tested as pt had SOZO  screen yesterday and no lymphedema present        TODAY'S TREATMENT  09/08/21 Manual Therapy MFR: At Rt pectoralis insertion where tightness palpable over cording that was present today, this was much improved by end of session  P/ROM: To Rt shoulder into flexion, abd and D2 to pts available end motions, pt reports feeling good stretch at pectoralis STM: To Rt pectoralis insertion where palpably tight   09/01/21: Manual Therapy MFR: At Rt breast incision where cording palpable and visible, more so with pt in sitting and arm OH so worked on this in supine and sitting P/ROM: To Rt shoulder into flexion, abd and D2 to pts available end motions, pt reports feeling good stretch at pectoralis STM: To Rt pectoralis insertion and  axilla where pt reporting new tenderness MLD: In Supine: Short nec, 5 diaphragmatic breaths, Rt inguinal and Lt axillary nodes, Rt axillo-inguinal and anterior inter-axillary anastomosis, then focused on breast, also in Lt S/L for further work near breast incision where small area of peau d'orange  noticeable today where her cording leads to. Instructed pt in this and had her return demo; issued handout   08/17/21: Manual Therapy MFR: At Rt pectoralis insertion where tightness palpable but no cording today found by therapist or pt, despite pts UE in varying positions and with and without trunk rotation.  P/ROM: To Rt shoulder into flexion, abd and D2 to pts available end motions, pt reports feeling good stretch at pectoralis STM: To Rt pectoralis insertion where palpably tight  08/09/21: Myofascial release to cording above right scar area into shoulder and along pec major Brief MLD to right shoudler, anterior chest . Tried several positions with pt in supine and sidelying to provoke cording. Pt felt significant stretch in supine with legs rotated to left , upper body rotated to right with right arm at 90 degrees abduction, elbow straight with fingers and wrist exteded  with deep breath to put traction on fascial anterior sling. Pt encouraged to do this slowly up to 5 reps and monitor for response  Made a foam patch of double white foam to wear in compression bra just above incision    PATIENT EDUCATION:  Education details: Self breast MLD Person educated: Patient Education method: Explanation, Demonstration, and Tactile cues Education comprehension: verbalized understanding and returned demonstration     HOME EXERCISE PROGRAM: Add stretch as above ; self Rt breast MLD   ASSESSMENT:   CLINICAL IMPRESSION: Pt reports noting increased tenderness at her Rt axilla and this made her nervous. Encouraged her that in this phase of her treatment it would be highly unlikely to be a recurrence and that  this feels like scar tissue especially as her cording is right in same area. Also noted small area of peau d'orange right around incision where palpable swelling so focused MLD around this area and instructed pt in same having her return demo, also issued handout. Pt reports much improvement of tenderness at axilla after session so encouraged her to perform self MLD 1-2x/daily and MFR to cording as instructed today. She verbalized good understanding.    OBJECTIVE IMPAIRMENTS increased fascial restrictions.      PARTICIPATION LIMITATIONS: ongoing chemo    PERSONAL FACTORS Past/current experiences are also affecting patient's functional outcome.  Pt has recently completed PT for treatment of similar symptoms    REHAB POTENTIAL: Good   CLINICAL DECISION MAKING: Stable/uncomplicated   EVALUATION COMPLEXITY: Low   GOALS: Goals reviewed with patient? Yes   LONG TERM GOALS:  Target date: 09/06/2021     Pt will report that symptoms of cording are decreased by 50% and that she knows how to manage them  Baseline: Goal status: INITIAL   2.  Pt will have a compression sleeve to wear prophylactically for flying and at high elevations  Baseline:  Goal status: INITIAL     PLAN: PT FREQUENCY: 1-2x/week   PT DURATION: 4 weeks   PLANNED INTERVENTIONS: manual techniques, manual lymph drainage, patient  eduction, orthotic fit, therapeutic exercise    PLAN FOR NEXT SESSION:  chip pack to wear over incision? check for cording, techniques as indicated; new area of peau d'orange still present? How is self MLD going?  Otelia Limes, PTA 09/08/2021, 11:05 AM

## 2021-09-08 NOTE — Patient Instructions (Signed)
Self manual lymph drainage: Perform this sequence once a day.  Only give enough pressure to your skin to make the skin move.  Hug yourself.  Do circles at your neck just above your collarbones.  Repeat this 10 times.  Diaphragmatic - Supine   Inhale through nose making navel move out toward hands. Exhale through puckered lips, hands follow navel in. Repeat _5__ times. Rest _10__ seconds between repeats.    Axilla - One at a Time   Using full weight of flat hand and fingers at center of uninvolved armpit, make _10__ in-place circles.   Copyright  VHI. All rights reserved.  LEG: Inguinal Nodes Stimulation   With small finger side of hand against hip crease on involved side, gently perform circles at the crease. Repeat __10_ times.   Copyright  VHI. All rights reserved.  Axilla to Inguinal Nodes - Pump   On involved side, pump _4__ times from armpit along side of trunk to hip crease.  Now gently stretch skin from the involved side to the uninvolved side across the chest at the shoulder line.  Repeat that 4 times.  Draw an imaginary diagonal line from upper outer breast through the nipple area toward lower inner breast.  Direct fluid upward and inward from this line toward the pathway across your upper chest .  Do this in three rows to treat all of the upper inner breast tissue, and do each row 3-4x.      Direct fluid to treat all of lower outer breast tissue downward and outward toward pathway that is aimed at the left groin.  Finish by doing the pathways as described above going from your involved armpit to the same side groin and going across your upper chest from the involved shoulder to the uninvolved shoulder.  Repeat the steps above where you do circles in your right groin and left armpit.    Cancer Rehab [phone]

## 2021-09-11 NOTE — Anesthesia Preprocedure Evaluation (Signed)
Anesthesia Evaluation  Patient identified by MRN, date of birth, ID band Patient awake    Reviewed: Allergy & Precautions, NPO status , Patient's Chart, lab work & pertinent test results  History of Anesthesia Complications Negative for: history of anesthetic complications  Airway Mallampati: II  TM Distance: >3 FB Neck ROM: Full    Dental no notable dental hx.    Pulmonary neg pulmonary ROS,    Pulmonary exam normal        Cardiovascular negative cardio ROS Normal cardiovascular exam     Neuro/Psych  Headaches, negative psych ROS   GI/Hepatic negative GI ROS, Neg liver ROS,   Endo/Other  negative endocrine ROS  Renal/GU negative Renal ROS  negative genitourinary   Musculoskeletal negative musculoskeletal ROS (+)   Abdominal   Peds  Hematology negative hematology ROS (+)   Anesthesia Other Findings Hx of breast ca  Reproductive/Obstetrics negative OB ROS                            Anesthesia Physical Anesthesia Plan  ASA: 2  Anesthesia Plan: MAC   Post-op Pain Management: Minimal or no pain anticipated   Induction:   PONV Risk Score and Plan: 2 and Treatment may vary due to age or medical condition, Midazolam, Ondansetron and Propofol infusion  Airway Management Planned: Natural Airway and Simple Face Mask  Additional Equipment: None  Intra-op Plan:   Post-operative Plan:   Informed Consent: I have reviewed the patients History and Physical, chart, labs and discussed the procedure including the risks, benefits and alternatives for the proposed anesthesia with the patient or authorized representative who has indicated his/her understanding and acceptance.       Plan Discussed with: CRNA  Anesthesia Plan Comments:        Anesthesia Quick Evaluation

## 2021-09-12 ENCOUNTER — Encounter: Payer: Self-pay | Admitting: Radiation Oncology

## 2021-09-12 ENCOUNTER — Other Ambulatory Visit: Payer: Self-pay

## 2021-09-12 NOTE — H&P (Signed)
Natasha Ortega is an 51 y.o. female.   Chief Complaint: port no longer needed HPI: she has completed her therapy for breast cancer and her port is no longer needed.  She has been doing well  Past Medical History:  Diagnosis Date   Cancer ([place]) 04/21/2021   right breast IDC   Headache    migraines    Past Surgical History:  Procedure Laterality Date   BREAST LUMPECTOMY WITH RADIOACTIVE SEED AND SENTINEL LYMPH NODE BIOPSY Right 05/11/2021   Procedure: RIGHT BREAST LUMPECTOMY WITH RADIOACTIVE SEED AND SENTINEL LYMPH NODE BIOPSY;  Surgeon: [name], MD;  Location: [facility];  Service: General;  Laterality: Right;   PORTACATH PLACEMENT N/A 06/09/2021   Procedure: INSERTION PORT-A-CATH;  Surgeon: [name], MD;  Location: [facility];  Service: General;  Laterality: N/A;    Family History  Problem Relation Age of Onset   Breast cancer Maternal Grandmother 49       dx. again at 46, unsure if recurrence or second primary   Prostate cancer Paternal Grandfather    Breast cancer Other    Brain cancer Other    Social History:  reports that she has never smoked. She has never used smokeless tobacco. She reports current alcohol use. She reports that she does not use drugs.  Allergies:  Allergies  Allergen Reactions   Penicillins Rash    Childhood    No medications prior to admission.    No results found for this or any previous visit (from the past 48 hour(s)). No results found.  Review of Systems  All other systems reviewed and are negative.   Height 5' 4.5" (1.638 m), weight 88.9 kg. Physical Exam Constitutional:      Appearance: Normal appearance. She is not ill-appearing.  HENT:     Head: Normocephalic and atraumatic.  Cardiovascular:     Rate and Rhythm: Normal rate and regular rhythm.  Pulmonary:     Effort: Pulmonary effort is normal.     Breath sounds: Normal breath sounds.  Abdominal:     General: Abdomen is flat.     Tenderness: There  is no abdominal tenderness.  Musculoskeletal:        General: Normal range of motion.     Cervical back: Normal range of motion.  Skin:    General: Skin is warm and dry.  Neurological:     General: No focal deficit present.     Mental Status: She is alert.  Psychiatric:        Mood and Affect: Mood normal.        Judgment: Judgment normal.      Assessment/Plan History of breast cancer, port-a-cath no longer needed  We will now proceed to the OR for port removal.  The risks of the procedure were discussed with the patient who agrees to proceed.  [name], MD 09/12/2021, 1:00 PM

## 2021-09-13 ENCOUNTER — Ambulatory Visit (HOSPITAL_BASED_OUTPATIENT_CLINIC_OR_DEPARTMENT_OTHER): Payer: BC Managed Care – PPO | Admitting: Anesthesiology

## 2021-09-13 ENCOUNTER — Telehealth: Payer: Self-pay | Admitting: Radiation Oncology

## 2021-09-13 ENCOUNTER — Ambulatory Visit (HOSPITAL_BASED_OUTPATIENT_CLINIC_OR_DEPARTMENT_OTHER)
Admission: RE | Admit: 2021-09-13 | Discharge: 2021-09-13 | Disposition: A | Discharge status: Home / Self Care | Payer: BC Managed Care – PPO | Attending: Surgery | Admitting: Surgery

## 2021-09-13 ENCOUNTER — Encounter (HOSPITAL_BASED_OUTPATIENT_CLINIC_OR_DEPARTMENT_OTHER)
Admission: RE | Disposition: A | Discharge status: Home / Self Care | Payer: Self-pay | Source: Home / Self Care | Attending: Surgery

## 2021-09-13 ENCOUNTER — Encounter (HOSPITAL_BASED_OUTPATIENT_CLINIC_OR_DEPARTMENT_OTHER): Payer: Self-pay | Admitting: Surgery

## 2021-09-13 ENCOUNTER — Other Ambulatory Visit: Payer: Self-pay

## 2021-09-13 DIAGNOSIS — Z452 Encounter for adjustment and management of vascular access device: Secondary | ICD-10-CM | POA: Insufficient documentation

## 2021-09-13 DIAGNOSIS — Z853 Personal history of malignant neoplasm of breast: Secondary | ICD-10-CM | POA: Diagnosis not present

## 2021-09-13 DIAGNOSIS — Z01818 Encounter for other preprocedural examination: Secondary | ICD-10-CM

## 2021-09-13 HISTORY — PX: PORT-A-CATH REMOVAL: SHX5289

## 2021-09-13 LAB — POCT PREGNANCY, URINE: Preg Test, Ur: NEGATIVE

## 2021-09-13 SURGERY — REMOVAL PORT-A-CATH
Anesthesia: Monitor Anesthesia Care | Site: Chest | Laterality: Left

## 2021-09-13 MED ORDER — ONDANSETRON HCL 4 MG/2ML IJ SOLN
INTRAMUSCULAR | Status: AC
  Filled 2021-09-13: qty 2

## 2021-09-13 MED ORDER — CIPROFLOXACIN IN D5W 400 MG/200ML IV SOLN
400.0000 mg | INTRAVENOUS | Status: AC
  Administered 2021-09-13 (×2): 400 mg via INTRAVENOUS

## 2021-09-13 MED ORDER — CHLORHEXIDINE GLUCONATE CLOTH 2 % EX PADS: 6.0000 | MEDICATED_PAD | Freq: Once | CUTANEOUS | Status: DC

## 2021-09-13 MED ORDER — LACTATED RINGERS IV SOLN: INTRAVENOUS | Status: DC

## 2021-09-13 MED ORDER — LIDOCAINE HCL (PF) 1 % IJ SOLN
INTRAMUSCULAR | Status: AC
Start: 2021-09-13 — End: ?
  Filled 2021-09-13: qty 30

## 2021-09-13 MED ORDER — DEXAMETHASONE SODIUM PHOSPHATE 10 MG/ML IJ SOLN
INTRAMUSCULAR | Status: AC
Start: 2021-09-13 — End: ?
  Filled 2021-09-13: qty 1

## 2021-09-13 MED ORDER — DEXAMETHASONE SODIUM PHOSPHATE 4 MG/ML IJ SOLN
INTRAMUSCULAR | Status: DC | PRN
  Administered 2021-09-13: 5 mg via INTRAVENOUS

## 2021-09-13 MED ORDER — OXYCODONE HCL 5 MG PO TABS: 5.0000 mg | ORAL_TABLET | Freq: Once | ORAL | Status: DC | PRN

## 2021-09-13 MED ORDER — KETOROLAC TROMETHAMINE 30 MG/ML IJ SOLN
INTRAMUSCULAR | Status: AC
  Filled 2021-09-13: qty 1

## 2021-09-13 MED ORDER — FENTANYL CITRATE (PF) 100 MCG/2ML IJ SOLN
INTRAMUSCULAR | Status: AC
  Filled 2021-09-13: qty 2

## 2021-09-13 MED ORDER — KETOROLAC TROMETHAMINE 30 MG/ML IJ SOLN
INTRAMUSCULAR | Status: DC | PRN
  Administered 2021-09-13: 30 mg via INTRAVENOUS

## 2021-09-13 MED ORDER — FENTANYL CITRATE (PF) 100 MCG/2ML IJ SOLN: 25.0000 ug | INTRAMUSCULAR | Status: DC | PRN

## 2021-09-13 MED ORDER — FENTANYL CITRATE (PF) 100 MCG/2ML IJ SOLN
INTRAMUSCULAR | Status: DC | PRN
  Administered 2021-09-13 (×2): 50 ug via INTRAVENOUS

## 2021-09-13 MED ORDER — ACETAMINOPHEN 500 MG PO TABS
1000.0000 mg | ORAL_TABLET | ORAL | Status: AC
  Administered 2021-09-13: 1000 mg via ORAL

## 2021-09-13 MED ORDER — LIDOCAINE HCL (PF) 1 % IJ SOLN
INTRAMUSCULAR | Status: DC | PRN
  Administered 2021-09-13: 5 mL

## 2021-09-13 MED ORDER — PROPOFOL 10 MG/ML IV BOLUS
INTRAVENOUS | Status: AC
  Filled 2021-09-13: qty 20

## 2021-09-13 MED ORDER — LIDOCAINE 2% (20 MG/ML) 5 ML SYRINGE
INTRAMUSCULAR | Status: AC
Start: 2021-09-13 — End: ?
  Filled 2021-09-13: qty 5

## 2021-09-13 MED ORDER — LIDOCAINE HCL (CARDIAC) PF 100 MG/5ML IV SOSY
PREFILLED_SYRINGE | INTRAVENOUS | Status: DC | PRN
  Administered 2021-09-13: 50 mg via INTRAVENOUS

## 2021-09-13 MED ORDER — CIPROFLOXACIN IN D5W 400 MG/200ML IV SOLN
INTRAVENOUS | Status: AC
Start: 2021-09-13 — End: ?
  Filled 2021-09-13: qty 200

## 2021-09-13 MED ORDER — BUPIVACAINE-EPINEPHRINE (PF) 0.5% -1:200000 IJ SOLN
INTRAMUSCULAR | Status: AC
Start: 2021-09-13 — End: ?
  Filled 2021-09-13: qty 120

## 2021-09-13 MED ORDER — PROPOFOL 10 MG/ML IV BOLUS
INTRAVENOUS | Status: DC | PRN
  Administered 2021-09-13 (×6): 20 mg via INTRAVENOUS

## 2021-09-13 MED ORDER — MIDAZOLAM HCL 5 MG/5ML IJ SOLN
INTRAMUSCULAR | Status: DC | PRN
  Administered 2021-09-13: 2 mg via INTRAVENOUS

## 2021-09-13 MED ORDER — ACETAMINOPHEN 500 MG PO TABS
ORAL_TABLET | ORAL | Status: AC
  Filled 2021-09-13: qty 2

## 2021-09-13 MED ORDER — OXYCODONE HCL 5 MG/5ML PO SOLN: 5.0000 mg | Freq: Once | ORAL | Status: DC | PRN

## 2021-09-13 MED ORDER — MIDAZOLAM HCL 2 MG/2ML IJ SOLN
INTRAMUSCULAR | Status: AC
  Filled 2021-09-13: qty 2

## 2021-09-13 MED ORDER — ONDANSETRON HCL 4 MG/2ML IJ SOLN
INTRAMUSCULAR | Status: DC | PRN
  Administered 2021-09-13: 4 mg via INTRAVENOUS

## 2021-09-13 SURGICAL SUPPLY — 29 items
ADH SKN CLS APL DERMABOND .7 (GAUZE/BANDAGES/DRESSINGS) ×1
APL PRP STRL LF DISP 70% ISPRP (MISCELLANEOUS) ×1
BLADE SURG 15 STRL LF DISP TIS (BLADE) ×1 IMPLANT
BLADE SURG 15 STRL SS (BLADE) ×2
CHLORAPREP W/TINT 26 (MISCELLANEOUS) ×2 IMPLANT
COVER BACK TABLE 60X90IN (DRAPES) ×2 IMPLANT
COVER MAYO STAND STRL (DRAPES) ×2 IMPLANT
DERMABOND ADVANCED (GAUZE/BANDAGES/DRESSINGS) ×1
DERMABOND ADVANCED .7 DNX12 (GAUZE/BANDAGES/DRESSINGS) ×1 IMPLANT
DRAPE LAPAROTOMY 100X72 PEDS (DRAPES) ×2 IMPLANT
DRAPE UTILITY XL STRL (DRAPES) ×2 IMPLANT
ELECT REM PT RETURN 9FT ADLT (ELECTROSURGICAL) ×2
ELECTRODE REM PT RTRN 9FT ADLT (ELECTROSURGICAL) ×1 IMPLANT
GLOVE SURG SIGNA 7.5 PF LTX (GLOVE) ×2 IMPLANT
GOWN STRL REUS W/ TWL LRG LVL3 (GOWN DISPOSABLE) ×1 IMPLANT
GOWN STRL REUS W/ TWL XL LVL3 (GOWN DISPOSABLE) ×1 IMPLANT
GOWN STRL REUS W/TWL LRG LVL3 (GOWN DISPOSABLE) ×2
GOWN STRL REUS W/TWL XL LVL3 (GOWN DISPOSABLE) ×2
NDL HYPO 25X1 1.5 SAFETY (NEEDLE) ×1 IMPLANT
NEEDLE HYPO 25X1 1.5 SAFETY (NEEDLE) ×2 IMPLANT
NS IRRIG 1000ML POUR BTL (IV SOLUTION) IMPLANT
PACK BASIN DAY SURGERY FS (CUSTOM PROCEDURE TRAY) ×2 IMPLANT
PENCIL SMOKE EVACUATOR (MISCELLANEOUS) ×2 IMPLANT
SUT MNCRL AB 4-0 PS2 18 (SUTURE) ×2 IMPLANT
SUT VIC AB 3-0 SH 27 (SUTURE) ×2
SUT VIC AB 3-0 SH 27X BRD (SUTURE) ×1 IMPLANT
SYR BULB EAR ULCER 3OZ GRN STR (SYRINGE) IMPLANT
SYR CONTROL 10ML LL (SYRINGE) ×2 IMPLANT
TOWEL GREEN STERILE FF (TOWEL DISPOSABLE) ×2 IMPLANT

## 2021-09-13 NOTE — Discharge Instructions (Addendum)
You may shower starting tomorrow  Ice pack, Tylenol, and ibuprofen for pain  No vigorous activity for 1 week  No tylenol until 1pm No lbuprofen until 2pm   Post Anesthesia Home Care Instructions  Activity: Get plenty of rest for the remainder of the day. A responsible individual must stay with you for 24 hours following the procedure.  For the next 24 hours, DO NOT: -Drive a car -Paediatric nurse -Drink alcoholic beverages -Take any medication unless instructed by your physician -Make any legal decisions or sign important papers.  Meals: Start with liquid foods such as gelatin or soup. Progress to regular foods as tolerated. Avoid greasy, spicy, heavy foods. If nausea and/or vomiting occur, drink only clear liquids until the nausea and/or vomiting subsides. Call your physician if vomiting continues.  Special Instructions/Symptoms: Your throat may feel dry or sore from the anesthesia or the breathing tube placed in your throat during surgery. If this causes discomfort, gargle with warm salt water. The discomfort should disappear within 24 hours.  If you had a scopolamine patch placed behind your ear for the management of post- operative nausea and/or vomiting:  1. The medication in the patch is effective for 72 hours, after which it should be removed.  Wrap patch in a tissue and discard in the trash. Wash hands thoroughly with soap and water. 2. You may remove the patch earlier than 72 hours if you experience unpleasant side effects which may include dry mouth, dizziness or visual disturbances. 3. Avoid touching the patch. Wash your hands with soap and water after contact with the patch.

## 2021-09-13 NOTE — Op Note (Signed)
REMOVAL PORT-A-CATH  Procedure Note  Natasha Ortega 09/13/2021   Pre-op Diagnosis: HISTORY OF BREAST CANCER port no longer needed     Post-op Diagnosis: same  Procedure(s): REMOVAL PORT-A-CATH  Surgeon(s): [name], MD  Anesthesia: Monitor Anesthesia Care  Staff:  Circulator: Izora Ribas, RN Scrub Person: Doug Sou E  Estimated Blood Loss: Minimal               Indications: This is a 51 year old female has completed her treatment for breast cancer.  The decision has been made to proceed with Port-A-Cath removal  Procedure: The patient was brought to the operating room and identifies correct patient.  She is placed upon on the operating table and anesthesia was induced.  Her left chest was then prepped and draped in usual sterile fashion.  I anesthetized the skin and her previous port site on the left chest with lidocaine.  I made an incision through the previous scar with a scalpel.  I then dissected down to the port.  I identified the 2 previous placed Prolene sutures and excised these.  The port and catheter then was removed easily.  I closed the catheter site with a figure-of-eight 3-0 Vicryl suture.  I then closed the subcutaneous tissue with interrupted 3-0 Vicryl sutures and closed the skin with a running 4-0 Monocryl.  Dermabond was then applied.  The patient tolerated the procedure well.  All the counts were correct at the end of the procedure.  She was then taken in stable condition to the recovery room.          [name]   Date: 09/13/2021  Time: 8:23 AM

## 2021-09-13 NOTE — Transfer of Care (Signed)
Immediate Anesthesia Transfer of Care Note  Patient: Natasha Ortega  Procedure(s) Performed: REMOVAL PORT-A-CATH (Left: Chest)  Patient Location: PACU  Anesthesia Type:MAC  Level of Consciousness: awake, alert  and oriented  Airway & Oxygen Therapy: Patient Spontanous Breathing  Post-op Assessment: Report given to RN and Post -op Vital signs reviewed and stable  Post vital signs: Reviewed and stable  Last Vitals:  Vitals Value Taken Time  BP 86/59 09/13/21 0830  Temp    Pulse 89 09/13/21 0831  Resp 25 09/13/21 0831  SpO2 96 % 09/13/21 0831  Vitals shown include unvalidated device data.  Last Pain:  Vitals:   09/13/21 0706  TempSrc: Oral  PainSc: 0-No pain         Complications: No notable events documented.

## 2021-09-13 NOTE — Anesthesia Procedure Notes (Addendum)
Procedure Name: MAC Date/Time: 09/13/2021 8:02 AM  Performed by: Bufford Spikes, CRNAPre-anesthesia Checklist: Patient identified, Emergency Drugs available, Suction available and Patient being monitored Patient Re-evaluated:Patient Re-evaluated prior to induction Oxygen Delivery Method: Simple face mask

## 2021-09-13 NOTE — Telephone Encounter (Signed)
I spoke with the patient let her know that Dr. Lisbeth Renshaw recommended 6 1/2 weeks of radiation to the breast and regional nodes, rather than 4 weeks of high tangents. She is in agreement and had also read up on this after our last visit. We will adjust her prescription in her chart and she will simulate next week.

## 2021-09-13 NOTE — Interval H&P Note (Signed)
History and Physical Interval Note:no change in H and P  09/13/2021 7:35 AM  Natasha Ortega  has presented today for surgery, with the diagnosis of HISTORY OF BREAST CANCER.  The various methods of treatment have been discussed with the patient and family. After consideration of risks, benefits and other options for treatment, the patient has consented to  Procedure(s): REMOVAL PORT-A-CATH (N/A) as a surgical intervention.  The patient's history has been reviewed, patient examined, no change in status, stable for surgery.  I have reviewed the patient's chart and labs.  Questions were answered to the patient's satisfaction.     [name]

## 2021-09-13 NOTE — Anesthesia Postprocedure Evaluation (Signed)
Anesthesia Post Note  Patient: Natasha Ortega  Procedure(s) Performed: REMOVAL PORT-A-CATH (Left: Chest)     Patient location during evaluation: PACU Anesthesia Type: MAC Level of consciousness: awake and alert Pain management: pain level controlled Vital Signs Assessment: post-procedure vital signs reviewed and stable Respiratory status: spontaneous breathing, nonlabored ventilation and respiratory function stable Cardiovascular status: blood pressure returned to baseline Postop Assessment: no apparent nausea or vomiting Anesthetic complications: no   No notable events documented.  Last Vitals:  Vitals:   09/13/21 0845 09/13/21 0900  BP: (!) 84/66 (!) 87/65  Pulse: 74 71  Resp: 15 (!) 8  Temp:    SpO2: 98% 99%    Last Pain:  Vitals:   09/13/21 0900  TempSrc:   PainSc: 0-No pain                 Marthenia Rolling

## 2021-09-14 ENCOUNTER — Encounter (HOSPITAL_BASED_OUTPATIENT_CLINIC_OR_DEPARTMENT_OTHER): Payer: Self-pay | Admitting: Surgery

## 2021-09-15 ENCOUNTER — Ambulatory Visit: Payer: BC Managed Care – PPO | Admitting: Physical Therapy

## 2021-09-15 ENCOUNTER — Encounter: Payer: Self-pay | Admitting: Physical Therapy

## 2021-09-15 DIAGNOSIS — R293 Abnormal posture: Secondary | ICD-10-CM

## 2021-09-15 DIAGNOSIS — Z483 Aftercare following surgery for neoplasm: Secondary | ICD-10-CM | POA: Diagnosis not present

## 2021-09-15 DIAGNOSIS — Z171 Estrogen receptor negative status [ER-]: Secondary | ICD-10-CM

## 2021-09-15 NOTE — Therapy (Signed)
OUTPATIENT PHYSICAL THERAPY TREATMENT NOTE   Patient Name: Natasha Ortega MRN: [id number] DOB:10/10/1970, 51 y.o., female Today's Date: 09/15/2021  PCP: Louretta Shorten  REFERRING PROVIDER: Nicholas Lose, MD  END OF SESSION:   PT End of Session - 09/15/21 1104     Visit Number 5    Number of Visits 13    Date for PT Re-Evaluation 10/13/21    PT Start Time 1103    PT Stop Time 1156    PT Time Calculation (min) 53 min    Activity Tolerance Patient tolerated treatment well    Behavior During Therapy [facility] for tasks assessed/performed             Past Medical History:  Diagnosis Date   Cancer ([place]) 04/21/2021   right breast IDC   Headache    migraines   Past Surgical History:  Procedure Laterality Date   BREAST LUMPECTOMY WITH RADIOACTIVE SEED AND SENTINEL LYMPH NODE BIOPSY Right 05/11/2021   Procedure: RIGHT BREAST LUMPECTOMY WITH RADIOACTIVE SEED AND SENTINEL LYMPH NODE BIOPSY;  Surgeon: [name], MD;  Location: [place];  Service: General;  Laterality: Right;   PORT-A-CATH REMOVAL Left 09/13/2021   Procedure: REMOVAL PORT-A-CATH;  Surgeon: [name], MD;  Location: [place];  Service: General;  Laterality: Left;   PORTACATH PLACEMENT N/A 06/09/2021   Procedure: INSERTION PORT-A-CATH;  Surgeon: [name], MD;  Location: [place];  Service: General;  Laterality: N/A;   Patient Active Problem List   Diagnosis Date Noted   Port-A-Cath in place 06/21/2021   Genetic testing 05/09/2021   Family history of breast cancer 05/05/2021   Malignant neoplasm of upper-outer quadrant of right breast in female, estrogen receptor negative ([place]) 04/28/2021    REFERRING DIAG: malignant neoplasm of right breast   THERAPY DIAG:  Aftercare following surgery for neoplasm - Plan: PT plan of care cert/re-cert  Abnormal posture - Plan: PT plan of care cert/re-cert  Malignant neoplasm of upper-outer quadrant of right breast in female,  estrogen receptor negative ([place]) - Plan: PT plan of care cert/re-cert  Rationale for Evaluation and Treatment Rehabilitation  PERTINENT HISTORY: Patient was diagnosed on 04/28/2021 with right grade 2 invasive ductal carcinoma breast cancer. She underwent a right lumpectomy and sentinel node biopsy on 05/11/2021. She had 1 node with micromets and 2 negative nodes (3 total) removed. It is ER/PR negative and HER2 negative with a Ki67 of 40%.   PRECAUTIONS:  ongoing chemo   SUBJECTIVE: I still have the cording. They are easier to feel when I am standing up. They are going to radiate my lymph nodes.   PAIN:  Are you having pain? No   OBJECTIVE:  UPPER EXTREMITY AROM/PROM:   A/PROM RIGHT   eval    Shoulder extension    Shoulder flexion 173  Shoulder abduction 177  Shoulder internal rotation    Shoulder external rotation                            (Blank rows = not tested)       CERVICAL AROM: All within normal limits:          UPPER EXTREMITY STRENGTH: WNL      LYMPHEDEMA ASSESSMENTS:  not tested as pt had SOZO screen yesterday and no lymphedema present        TODAY'S TREATMENT  09/15/21 Manual Therapy MFR: to cording across right pec extending downwards towards breast in sitting  and lying down  MLD: In Supine: Short nec, 5 diaphragmatic breaths, Rt inguinal and Lt axillary nodes, Rt axillo-inguinal and anterior inter-axillary anastomosis, then focused inferior axillary area where pt feels increased fullness at times then retraced all steps STM to lumpectomy scar   09/08/21 Manual Therapy MFR: At Rt pectoralis insertion where tightness palpable over cording that was present today, this was much improved by end of session  P/ROM: To Rt shoulder into flexion, abd and D2 to pts available end motions, pt reports feeling good stretch at pectoralis STM: To Rt pectoralis insertion where palpably tight   09/01/21: Manual Therapy MFR: At Rt breast incision where cording palpable  and visible, more so with pt in sitting and arm OH so worked on this in supine and sitting P/ROM: To Rt shoulder into flexion, abd and D2 to pts available end motions, pt reports feeling good stretch at pectoralis STM: To Rt pectoralis insertion and axilla where pt reporting new tenderness MLD: In Supine: Short nec, 5 diaphragmatic breaths, Rt inguinal and Lt axillary nodes, Rt axillo-inguinal and anterior inter-axillary anastomosis, then focused on breast, also in Lt S/L for further work near breast incision where small area of peau d'orange  noticeable today where her cording leads to. Instructed pt in this and had her return demo; issued handout   08/17/21: Manual Therapy MFR: At Rt pectoralis insertion where tightness palpable but no cording today found by therapist or pt, despite pts UE in varying positions and with and without trunk rotation.  P/ROM: To Rt shoulder into flexion, abd and D2 to pts available end motions, pt reports feeling good stretch at pectoralis STM: To Rt pectoralis insertion where palpably tight  08/09/21: Myofascial release to cording above right scar area into shoulder and along pec major Brief MLD to right shoudler, anterior chest . Tried several positions with pt in supine and sidelying to provoke cording. Pt felt significant stretch in supine with legs rotated to left , upper body rotated to right with right arm at 90 degrees abduction, elbow straight with fingers and wrist exteded  with deep breath to put traction on fascial anterior sling. Pt encouraged to do this slowly up to 5 reps and monitor for response  Made a foam patch of double white foam to wear in compression bra just above incision    PATIENT EDUCATION:  Education details: Self breast MLD Person educated: Patient Education method: Explanation, Demonstration, and Tactile cues Education comprehension: verbalized understanding and returned demonstration     HOME EXERCISE PROGRAM: Add stretch as above ;  self Rt breast MLD   ASSESSMENT:   CLINICAL IMPRESSION: Pt still feeling two cords extending towards breast. Continued with myofascial release to cording in both siting and supine as well as STM to lumpectomy scar to decrease fibrosis. Educated pt to continue to stretch at home daily and perform self release to cording daily instead of every other day. Assessed pt's progress towards goals in therapy and updated goals as appropriate. Pt would benefit from continued skilled PT services to continue to decrease cording and fibrosis especially since pt will be receiving radiation to her axilla.    OBJECTIVE IMPAIRMENTS increased fascial restrictions.      PARTICIPATION LIMITATIONS: ongoing chemo    PERSONAL FACTORS Past/current experiences are also affecting patient's functional outcome.  Pt has recently completed PT for treatment of similar symptoms    REHAB POTENTIAL: Good   CLINICAL DECISION MAKING: Stable/uncomplicated   EVALUATION COMPLEXITY: Low   GOALS: Goals  reviewed with patient? Yes   LONG TERM GOALS: Target date: 09/06/2021     Pt will report that symptoms of cording are decreased by 75% and that she knows how to manage them  Baseline: Goal status: ONGOING/UPDATED GOAL -09/15/21 40% better   2.  Pt will have a compression sleeve to wear prophylactically for flying and at high elevations  Baseline:  Goal status: ONGOING - 09/15/21 she has obtained a sleeve but will bring it in to be assessed     PLAN: PT FREQUENCY: 1-2x/week   PT DURATION: 4 weeks   PLANNED INTERVENTIONS: manual techniques, manual lymph drainage, patient  eduction, orthotic fit, therapeutic exercise    PLAN FOR NEXT SESSION:  chip pack to wear over incision? check for cording, techniques as indicated; new area of peau d'orange still present? How is self MLD going?  [facility], PT 09/15/2021, 12:10 PM

## 2021-09-19 ENCOUNTER — Ambulatory Visit: Payer: BC Managed Care – PPO

## 2021-09-19 DIAGNOSIS — Z483 Aftercare following surgery for neoplasm: Secondary | ICD-10-CM

## 2021-09-19 DIAGNOSIS — R293 Abnormal posture: Secondary | ICD-10-CM

## 2021-09-19 DIAGNOSIS — Z171 Estrogen receptor negative status [ER-]: Secondary | ICD-10-CM

## 2021-09-19 NOTE — Therapy (Signed)
OUTPATIENT PHYSICAL THERAPY TREATMENT NOTE   Patient Name: Natasha Ortega MRN: [id number] DOB:Apr 22, 1970, 51 y.o., female Today's Date: 09/19/2021  PCP: Louretta Shorten  REFERRING PROVIDER: Nicholas Lose, MD  END OF SESSION:   PT End of Session - 09/19/21 0911     Visit Number 6    Number of Visits 13    Date for PT Re-Evaluation 10/13/21    PT Start Time 0908    PT Stop Time 6629    PT Time Calculation (min) 54 min    Activity Tolerance Patient tolerated treatment well    Behavior During Therapy [facility] for tasks assessed/performed             Past Medical History:  Diagnosis Date   Cancer ([place]) 04/21/2021   right breast IDC   Headache    migraines   Past Surgical History:  Procedure Laterality Date   BREAST LUMPECTOMY WITH RADIOACTIVE SEED AND SENTINEL LYMPH NODE BIOPSY Right 05/11/2021   Procedure: RIGHT BREAST LUMPECTOMY WITH RADIOACTIVE SEED AND SENTINEL LYMPH NODE BIOPSY;  Surgeon: [name], MD;  Location: [place];  Service: General;  Laterality: Right;   PORT-A-CATH REMOVAL Left 09/13/2021   Procedure: REMOVAL PORT-A-CATH;  Surgeon: [name], MD;  Location: [place];  Service: General;  Laterality: Left;   PORTACATH PLACEMENT N/A 06/09/2021   Procedure: INSERTION PORT-A-CATH;  Surgeon: [name], MD;  Location: [place];  Service: General;  Laterality: N/A;   Patient Active Problem List   Diagnosis Date Noted   Port-A-Cath in place 06/21/2021   Genetic testing 05/09/2021   Family history of breast cancer 05/05/2021   Malignant neoplasm of upper-outer quadrant of right breast in female, estrogen receptor negative ([place]) 04/28/2021    REFERRING DIAG: malignant neoplasm of right breast   THERAPY DIAG:  Aftercare following surgery for neoplasm  Abnormal posture  Malignant neoplasm of upper-outer quadrant of right breast in female, estrogen receptor negative ([place])  Rationale for Evaluation and Treatment  Rehabilitation  PERTINENT HISTORY: Patient was diagnosed on 04/28/2021 with right grade 2 invasive ductal carcinoma breast cancer. She underwent a right lumpectomy and sentinel node biopsy on 05/11/2021. She had 1 node with micromets and 2 negative nodes (3 total) removed. It is ER/PR negative and HER2 negative with a Ki67 of 40%.   PRECAUTIONS:  ongoing chemo   SUBJECTIVE: I found one small new cord on the other end of my incision.    PAIN:  Are you having pain? No   OBJECTIVE:  UPPER EXTREMITY AROM/PROM:   A/PROM RIGHT   eval    Shoulder extension    Shoulder flexion 173  Shoulder abduction 177  Shoulder internal rotation    Shoulder external rotation                            (Blank rows = not tested)       CERVICAL AROM: All within normal limits:          UPPER EXTREMITY STRENGTH: WNL      LYMPHEDEMA ASSESSMENTS:  not tested as pt had SOZO screen yesterday and no lymphedema present        TODAY'S TREATMENT  09/19/21: Manual Therapy MFR: to cording across right pec extending downwards towards breast in supine, Lt S/L and in sitting MLD: In Supine: Short neck, 5 diaphragmatic breaths, Rt inguinal and Lt axillary nodes, Rt axillo-inguinal and anterior inter-axillary anastomosis, then focused inferior axillary area  where pt feels increased fullness at times then retraced all steps STM to lumpectomy scar  Made and issued a chip pack for pt to wear over incision under her compression bra at area of multiple cords  09/15/21 Manual Therapy MFR: to cording across right pec extending downwards towards breast in sitting and lying down  MLD: In Supine: Short nec, 5 diaphragmatic breaths, Rt inguinal and Lt axillary nodes, Rt axillo-inguinal and anterior inter-axillary anastomosis, then focused inferior axillary area where pt feels increased fullness at times then retraced all steps STM to lumpectomy scar   09/08/21 Manual Therapy MFR: At Rt pectoralis insertion where  tightness palpable over cording that was present today, this was much improved by end of session  P/ROM: To Rt shoulder into flexion, abd and D2 to pts available end motions, pt reports feeling good stretch at pectoralis STM: To Rt pectoralis insertion where palpably tight     PATIENT EDUCATION:  Education details: Self breast MLD Person educated: Patient Education method: Explanation, Demonstration, and Tactile cues Education comprehension: verbalized understanding and returned demonstration     HOME EXERCISE PROGRAM: Add stretch as above ; self Rt breast MLD   ASSESSMENT:   CLINICAL IMPRESSION: Pt still feeling two cords extending towards breast and now has found a smaller third at the lateral end of her incision as well. Continued with manual therapy focusing on this area in varying positions. Also issued chip pack for pt to wear over this area with her compression bra. Encouraged her to incorporate self MFR into her day as she reports she has struggled with being consistent with this.     OBJECTIVE IMPAIRMENTS increased fascial restrictions.      PARTICIPATION LIMITATIONS: ongoing chemo    PERSONAL FACTORS Past/current experiences are also affecting patient's functional outcome.  Pt has recently completed PT for treatment of similar symptoms    REHAB POTENTIAL: Good   CLINICAL DECISION MAKING: Stable/uncomplicated   EVALUATION COMPLEXITY: Low   GOALS: Goals reviewed with patient? Yes   LONG TERM GOALS: Target date: 09/06/2021     Pt will report that symptoms of cording are decreased by 75% and that she knows how to manage them  Baseline: Goal status: ONGOING/UPDATED GOAL -09/15/21 40% better   2.  Pt will have a compression sleeve to wear prophylactically for flying and at high elevations  Baseline:  Goal status: ONGOING - 09/15/21 she has obtained a sleeve but will bring it in to be assessed     PLAN: PT FREQUENCY: 1-2x/week   PT DURATION: 4 weeks   PLANNED  INTERVENTIONS: manual techniques, manual lymph drainage, patient  eduction, orthotic fit, therapeutic exercise    PLAN FOR NEXT SESSION:  How was chip pack over cording at incision? techniques as indicated; new area of peau d'orange still present? How is self MLD going?  Otelia Limes, PTA 09/19/2021, 10:03 AM

## 2021-09-21 ENCOUNTER — Telehealth: Payer: Self-pay | Admitting: *Deleted

## 2021-09-21 ENCOUNTER — Encounter: Payer: Self-pay | Admitting: Hematology and Oncology

## 2021-09-21 NOTE — Telephone Encounter (Signed)
Received call from pt stating her daughter has tested positive for Covid today 09/21/21.  States she has not had contact with her daughter since 09/19/21.  Pt educated to monitor for s/s and contact our office if she becomes symptomatic and tests positive.  Pt verbalized understanding.

## 2021-09-22 ENCOUNTER — Ambulatory Visit: Payer: BC Managed Care – PPO | Attending: Hematology and Oncology

## 2021-09-22 DIAGNOSIS — Z171 Estrogen receptor negative status [ER-]: Secondary | ICD-10-CM | POA: Diagnosis present

## 2021-09-22 DIAGNOSIS — Z483 Aftercare following surgery for neoplasm: Secondary | ICD-10-CM | POA: Diagnosis present

## 2021-09-22 DIAGNOSIS — C50411 Malignant neoplasm of upper-outer quadrant of right female breast: Secondary | ICD-10-CM | POA: Insufficient documentation

## 2021-09-22 DIAGNOSIS — R293 Abnormal posture: Secondary | ICD-10-CM | POA: Diagnosis present

## 2021-09-22 NOTE — Therapy (Signed)
OUTPATIENT PHYSICAL THERAPY TREATMENT NOTE   Patient Name: Natasha Ortega MRN: [id number] DOB:1971-01-14, 51 y.o., female Today's Date: 09/22/2021  PCP: Louretta Shorten  REFERRING PROVIDER: Nicholas Lose, MD  END OF SESSION:   PT End of Session - 09/22/21 0903     Visit Number 7    Number of Visits 13    Date for PT Re-Evaluation 10/13/21    PT Start Time 0904    PT Stop Time 1000    PT Time Calculation (min) 56 min    Activity Tolerance Patient tolerated treatment well    Behavior During Therapy [facility] for tasks assessed/performed             Past Medical History:  Diagnosis Date   Cancer ([place]) 04/21/2021   right breast IDC   Headache    migraines   Past Surgical History:  Procedure Laterality Date   BREAST LUMPECTOMY WITH RADIOACTIVE SEED AND SENTINEL LYMPH NODE BIOPSY Right 05/11/2021   Procedure: RIGHT BREAST LUMPECTOMY WITH RADIOACTIVE SEED AND SENTINEL LYMPH NODE BIOPSY;  Surgeon: [name], MD;  Location: [place];  Service: General;  Laterality: Right;   PORT-A-CATH REMOVAL Left 09/13/2021   Procedure: REMOVAL PORT-A-CATH;  Surgeon: [name], MD;  Location: [place];  Service: General;  Laterality: Left;   PORTACATH PLACEMENT N/A 06/09/2021   Procedure: INSERTION PORT-A-CATH;  Surgeon: [name], MD;  Location: [place];  Service: General;  Laterality: N/A;   Patient Active Problem List   Diagnosis Date Noted   Port-A-Cath in place 06/21/2021   Genetic testing 05/09/2021   Family history of breast cancer 05/05/2021   Malignant neoplasm of upper-outer quadrant of right breast in female, estrogen receptor negative ([place]) 04/28/2021    REFERRING DIAG: malignant neoplasm of right breast   THERAPY DIAG:  Aftercare following surgery for neoplasm  Abnormal posture  Malignant neoplasm of upper-outer quadrant of right breast in female, estrogen receptor negative ([place])  Rationale for Evaluation and Treatment  Rehabilitation  PERTINENT HISTORY: Patient was diagnosed on 04/28/2021 with right grade 2 invasive ductal carcinoma breast cancer. She underwent a right lumpectomy and sentinel node biopsy on 05/11/2021. She had 1 node with micromets and 2 negative nodes (3 total) removed. It is ER/PR negative and HER2 negative with a Ki67 of 40%.   PRECAUTIONS:  ongoing chemo   SUBJECTIVE: I've been wearing the chip pack and I think it's helped soften the fullness at my incision. I didn't get to put my sleeve on yesterday though, I kept forgetting but I brought it today.   PAIN:  Are you having pain? No   OBJECTIVE:  UPPER EXTREMITY AROM/PROM:   A/PROM RIGHT   eval    Shoulder extension    Shoulder flexion 173  Shoulder abduction 177  Shoulder internal rotation    Shoulder external rotation                            (Blank rows = not tested)       CERVICAL AROM: All within normal limits:          UPPER EXTREMITY STRENGTH: WNL      LYMPHEDEMA ASSESSMENTS:  not tested as pt had SOZO screen yesterday and no lymphedema present        TODAY'S TREATMENT  09/22/21:  Manual Therapy MFR: to cording across right pec extending downwards towards breast in supine, Lt S/L and in sitting MLD: In Supine:  Short neck, 5 diaphragmatic breaths, Rt inguinal and Lt axillary nodes, Rt axillo-inguinal and anterior inter-axillary anastomosis, then focused inferior axillary area where pt feels increased fullness at times then retraced all steps STM to lumpectomy scar  At end of session instructed pt in proper donning technique of her compression sleeve which was a good fit. Instructed her to wear this for the duration of her flight to [place] tomorrow and for a her trip as she will be active packing and moving her daughter. Pt verbalized good understanding.   09/19/21: Manual Therapy MFR: to cording across right pec extending downwards towards breast in supine, Lt S/L and in sitting MLD: In Supine: Short neck, 5  diaphragmatic breaths, Rt inguinal and Lt axillary nodes, Rt axillo-inguinal and anterior inter-axillary anastomosis, then focused inferior axillary area where pt feels increased fullness at times then retraced all steps STM to lumpectomy scar  Made and issued a chip pack for pt to wear over incision under her compression bra at area of multiple cords  09/15/21 Manual Therapy MFR: to cording across right pec extending downwards towards breast in sitting and lying down  MLD: In Supine: Short nec, 5 diaphragmatic breaths, Rt inguinal and Lt axillary nodes, Rt axillo-inguinal and anterior inter-axillary anastomosis, then focused inferior axillary area where pt feels increased fullness at times then retraced all steps STM to lumpectomy scar     PATIENT EDUCATION:  Education details: Self breast MLD Person educated: Patient Education method: Explanation, Demonstration, and Tactile cues Education comprehension: verbalized understanding and returned demonstration     HOME EXERCISE PROGRAM: Add stretch as above ; self Rt breast MLD   ASSESSMENT:   CLINICAL IMPRESSION: Pt returns bringing her new compression sleeve which we donner at end of session. She was educated in when to wear this for her upcoming trips. Continued manual therapy working to decrease cording at incision and fullness there as well which has improved over past few sessions. Pt will be on hold x2 weeks due to traveling. Will reassess when she returns and begins radiation.    OBJECTIVE IMPAIRMENTS increased fascial restrictions.      PARTICIPATION LIMITATIONS: ongoing chemo    PERSONAL FACTORS Past/current experiences are also affecting patient's functional outcome.  Pt has recently completed PT for treatment of similar symptoms    REHAB POTENTIAL: Good   CLINICAL DECISION MAKING: Stable/uncomplicated   EVALUATION COMPLEXITY: Low   GOALS: Goals reviewed with patient? Yes   LONG TERM GOALS: Target date: 09/06/2021      Pt will report that symptoms of cording are decreased by 75% and that she knows how to manage them  Baseline: Goal status: ONGOING/UPDATED GOAL -09/15/21 40% better   2.  Pt will have a compression sleeve to wear prophylactically for flying and at high elevations  Baseline:  Goal status: ONGOING - 09/15/21 she has obtained a sleeve but will bring it in to be assessed     PLAN: PT FREQUENCY: 1-2x/week   PT DURATION: 4 weeks   PLANNED INTERVENTIONS: manual techniques, manual lymph drainage, patient  eduction, orthotic fit, therapeutic exercise    PLAN FOR NEXT SESSION:  How was compression sleeve? techniques as indicated;  How is self MLD going?  Otelia Limes, PTA 09/22/2021, 10:03 AM

## 2021-09-23 ENCOUNTER — Ambulatory Visit
Admission: RE | Admit: 2021-09-23 | Discharge: 2021-09-23 | Disposition: A | Discharge status: Home / Self Care | Payer: BC Managed Care – PPO | Source: Ambulatory Visit | Attending: Radiation Oncology | Admitting: Radiation Oncology

## 2021-09-23 ENCOUNTER — Other Ambulatory Visit: Payer: Self-pay

## 2021-09-23 DIAGNOSIS — C50411 Malignant neoplasm of upper-outer quadrant of right female breast: Secondary | ICD-10-CM | POA: Insufficient documentation

## 2021-09-23 DIAGNOSIS — Z510 Encounter for antineoplastic radiation therapy: Secondary | ICD-10-CM | POA: Diagnosis present

## 2021-09-29 ENCOUNTER — Encounter: Payer: Self-pay | Admitting: *Deleted

## 2021-10-06 DIAGNOSIS — Z510 Encounter for antineoplastic radiation therapy: Secondary | ICD-10-CM | POA: Diagnosis not present

## 2021-10-10 ENCOUNTER — Ambulatory Visit: Payer: BC Managed Care – PPO | Admitting: Radiation Oncology

## 2021-10-10 ENCOUNTER — Telehealth: Payer: Self-pay | Admitting: Hematology and Oncology

## 2021-10-10 NOTE — Telephone Encounter (Signed)
Rescheduled appointment per provider PAL. Patient is aware of the changes made to her upcoming appointment. 

## 2021-10-11 ENCOUNTER — Ambulatory Visit: Payer: BC Managed Care – PPO

## 2021-10-11 ENCOUNTER — Other Ambulatory Visit: Payer: Self-pay

## 2021-10-11 ENCOUNTER — Ambulatory Visit
Admission: RE | Admit: 2021-10-11 | Discharge: 2021-10-11 | Disposition: A | Discharge status: Home / Self Care | Payer: BC Managed Care – PPO | Source: Ambulatory Visit | Attending: Radiation Oncology | Admitting: Radiation Oncology

## 2021-10-11 DIAGNOSIS — R293 Abnormal posture: Secondary | ICD-10-CM

## 2021-10-11 DIAGNOSIS — Z483 Aftercare following surgery for neoplasm: Secondary | ICD-10-CM | POA: Diagnosis not present

## 2021-10-11 DIAGNOSIS — C50411 Malignant neoplasm of upper-outer quadrant of right female breast: Secondary | ICD-10-CM

## 2021-10-11 DIAGNOSIS — Z510 Encounter for antineoplastic radiation therapy: Secondary | ICD-10-CM | POA: Diagnosis not present

## 2021-10-11 LAB — RAD ONC ARIA SESSION SUMMARY

## 2021-10-11 NOTE — Therapy (Signed)
OUTPATIENT PHYSICAL THERAPY TREATMENT NOTE   Patient Name: Natasha Ortega MRN: [id number] DOB:02-Jan-1971, 51 y.o., female Today's Date: 10/11/2021  PCP: Louretta Shorten  REFERRING PROVIDER: Nicholas Lose, MD  END OF SESSION:   PT End of Session - 10/11/21 1114     Visit Number 8    Number of Visits 13    Date for PT Re-Evaluation 10/13/21    PT Start Time 1105    PT Stop Time 1205    PT Time Calculation (min) 60 min    Activity Tolerance Patient tolerated treatment well    Behavior During Therapy [facility] for tasks assessed/performed             Past Medical History:  Diagnosis Date   Cancer ([place]) 04/21/2021   right breast IDC   Headache    migraines   Past Surgical History:  Procedure Laterality Date   BREAST LUMPECTOMY WITH RADIOACTIVE SEED AND SENTINEL LYMPH NODE BIOPSY Right 05/11/2021   Procedure: RIGHT BREAST LUMPECTOMY WITH RADIOACTIVE SEED AND SENTINEL LYMPH NODE BIOPSY;  Surgeon: [name], MD;  Location: [place];  Service: General;  Laterality: Right;   PORT-A-CATH REMOVAL Left 09/13/2021   Procedure: REMOVAL PORT-A-CATH;  Surgeon: [name], MD;  Location: [place];  Service: General;  Laterality: Left;   PORTACATH PLACEMENT N/A 06/09/2021   Procedure: INSERTION PORT-A-CATH;  Surgeon: [name], MD;  Location: [place];  Service: General;  Laterality: N/A;   Patient Active Problem List   Diagnosis Date Noted   Port-A-Cath in place 06/21/2021   Genetic testing 05/09/2021   Family history of breast cancer 05/05/2021   Malignant neoplasm of upper-outer quadrant of right breast in female, estrogen receptor negative ([place]) 04/28/2021    REFERRING DIAG: malignant neoplasm of right breast   THERAPY DIAG:  Aftercare following surgery for neoplasm  Abnormal posture  Malignant neoplasm of upper-outer quadrant of right breast in female, estrogen receptor negative ([place])  Rationale for Evaluation and Treatment  Rehabilitation  PERTINENT HISTORY: Patient was diagnosed on 04/28/2021 with right grade 2 invasive ductal carcinoma breast cancer. She underwent a right lumpectomy and sentinel node biopsy on 05/11/2021. She had 1 node with micromets and 2 negative nodes (3 total) removed. It is ER/PR negative and HER2 negative with a Ki67 of 40%.   PRECAUTIONS:  ongoing chemo   SUBJECTIVE: I wore my compression sleeve for both flights and that seemed to go fine. Both my hands seemed to feel tight the other day but that's gone now. I'm feeling frustrated with my recovery and I'm not sure I'm doing the self MLD right. Also I started radiation today.  PAIN:  Are you having pain? No   OBJECTIVE:  UPPER EXTREMITY AROM/PROM:   A/PROM RIGHT   eval    Shoulder extension    Shoulder flexion 173  Shoulder abduction 177  Shoulder internal rotation    Shoulder external rotation                            (Blank rows = not tested)       CERVICAL AROM: All within normal limits:          UPPER EXTREMITY STRENGTH: WNL      LYMPHEDEMA ASSESSMENTS:  not tested as pt had SOZO screen yesterday and no lymphedema present        TODAY'S TREATMENT  10/11/21: MFR: to cording across right pec near lumpectomy incision extending  downwards towards breast in supine, Lt S/L and in sitting MLD: In Supine: Short neck, 5 diaphragmatic breaths, Rt inguinal and Lt axillary nodes, Rt axillo-inguinal and anterior inter-axillary anastomosis, next in t S/L to work near incision where pt reports feeling fullness during trip and reviewed Rt axillo-inguinal anastomosis hand placement, which pt was doing well, then retraced all steps in supine   09/22/21:  Manual Therapy MFR: to cording across right pec extending downwards towards breast in supine, Lt S/L and in sitting MLD: In Supine: Short neck, 5 diaphragmatic breaths, Rt inguinal and Lt axillary nodes, Rt axillo-inguinal and anterior inter-axillary anastomosis, then focused  inferior axillary area where pt feels increased fullness at times then retraced all steps STM to lumpectomy scar  At end of session instructed pt in proper donning technique of her compression sleeve which was a good fit. Instructed her to wear this for the duration of her flight to [place] tomorrow and for a her trip as she will be active packing and moving her daughter. Pt verbalized good understanding.   09/19/21: Manual Therapy MFR: to cording across right pec extending downwards towards breast in supine, Lt S/L and in sitting MLD: In Supine: Short neck, 5 diaphragmatic breaths, Rt inguinal and Lt axillary nodes, Rt axillo-inguinal and anterior inter-axillary anastomosis, then focused inferior axillary area where pt feels increased fullness at times then retraced all steps STM to lumpectomy scar  Made and issued a chip pack for pt to wear over incision under her compression bra at area of multiple cords     PATIENT EDUCATION:  Education details: Self breast MLD Person educated: Patient Education method: Explanation, Demonstration, and Tactile cues Education comprehension: verbalized understanding and returned demonstration     HOME EXERCISE PROGRAM: Add stretch as above ; self Rt breast MLD   ASSESSMENT:   CLINICAL IMPRESSION: Pt returns after 2 weeks of traveling. She wore her compression sleeve for most of traveling and all of time when she was flying. She reports noticing some bil hand tightness since returning but that has since resolved. She voiced some frustration at beginning of session as she felt she had backslid some with her recovery. However, upon assessing pt today her breast is soft with no palpable firmness, cording is about the same as at last session and pt is doing better than she thought with self MLD as we briefly reviewed this while therapist performed.   OBJECTIVE IMPAIRMENTS increased fascial restrictions.      PARTICIPATION LIMITATIONS: ongoing chemo     PERSONAL FACTORS Past/current experiences are also affecting patient's functional outcome.  Pt has recently completed PT for treatment of similar symptoms    REHAB POTENTIAL: Good   CLINICAL DECISION MAKING: Stable/uncomplicated   EVALUATION COMPLEXITY: Low   GOALS: Goals reviewed with patient? Yes   LONG TERM GOALS: Target date: 09/06/2021     Pt will report that symptoms of cording are decreased by 75% and that she knows how to manage them  Baseline: Goal status: ONGOING/UPDATED GOAL -09/15/21 40% better   2.  Pt will have a compression sleeve to wear prophylactically for flying and at high elevations  Baseline:  Goal status: ONGOING - 09/15/21 she has obtained a sleeve but will bring it in to be assessed     PLAN: PT FREQUENCY: 1-2x/week   PT DURATION: 4 weeks   PLANNED INTERVENTIONS: manual techniques, manual lymph drainage, patient  eduction, orthotic fit, therapeutic exercise    PLAN FOR NEXT SESSION:  More sessions? Cont  manual techniques as indicated;  How is self MLD going?  Otelia Limes, PTA 10/11/2021, 12:09 PM

## 2021-10-12 ENCOUNTER — Other Ambulatory Visit: Payer: Self-pay

## 2021-10-12 ENCOUNTER — Ambulatory Visit
Admission: RE | Admit: 2021-10-12 | Discharge: 2021-10-12 | Disposition: A | Discharge status: Home / Self Care | Payer: BC Managed Care – PPO | Source: Ambulatory Visit | Attending: Radiation Oncology | Admitting: Radiation Oncology

## 2021-10-12 DIAGNOSIS — Z510 Encounter for antineoplastic radiation therapy: Secondary | ICD-10-CM | POA: Diagnosis not present

## 2021-10-12 LAB — RAD ONC ARIA SESSION SUMMARY

## 2021-10-12 NOTE — Progress Notes (Signed)
Pt here for patient teaching.  Pt given Radiation and You booklet, skin care instructions, alra deodorant and Radiaplex.    Reviewed areas of pertinence such as fatigue, hair loss, skin changes, breast tenderness, and breast swelling. Pt able to give teach back of to pat skin and use unscented/gentle soap,apply Radiaplex bid, avoid applying anything to skin within 4 hours of treatment, avoid wearing an under wire bra, and to use an electric razor if they must shave. Pt verbalizes understanding of information given and will contact nursing with any questions or concerns.    Abrish Erny M. Zenab Gronewold RN, BSN  

## 2021-10-13 ENCOUNTER — Other Ambulatory Visit: Payer: Self-pay

## 2021-10-13 ENCOUNTER — Ambulatory Visit: Payer: BC Managed Care – PPO

## 2021-10-13 ENCOUNTER — Ambulatory Visit
Admission: RE | Admit: 2021-10-13 | Discharge: 2021-10-13 | Disposition: A | Discharge status: Home / Self Care | Payer: BC Managed Care – PPO | Source: Ambulatory Visit | Attending: Radiation Oncology | Admitting: Radiation Oncology

## 2021-10-13 DIAGNOSIS — Z483 Aftercare following surgery for neoplasm: Secondary | ICD-10-CM

## 2021-10-13 DIAGNOSIS — R293 Abnormal posture: Secondary | ICD-10-CM

## 2021-10-13 DIAGNOSIS — Z171 Estrogen receptor negative status [ER-]: Secondary | ICD-10-CM

## 2021-10-13 DIAGNOSIS — Z510 Encounter for antineoplastic radiation therapy: Secondary | ICD-10-CM | POA: Diagnosis not present

## 2021-10-13 LAB — RAD ONC ARIA SESSION SUMMARY

## 2021-10-13 NOTE — Therapy (Signed)
OUTPATIENT PHYSICAL THERAPY TREATMENT NOTE   Patient Name: Natasha Ortega MRN: [id number] DOB:Jun 29, 1970, 51 y.o., female Today's Date: 10/13/2021  PCP: Louretta Shorten  REFERRING PROVIDER: Nicholas Lose, MD  END OF SESSION:   PT End of Session - 10/13/21 1110     Visit Number 9    Number of Visits 13    Date for PT Re-Evaluation 10/13/21    PT Start Time 1108    PT Stop Time 1202    PT Time Calculation (min) 54 min    Activity Tolerance Patient tolerated treatment well    Behavior During Therapy [facility] for tasks assessed/performed             Past Medical History:  Diagnosis Date   Cancer ([place]) 04/21/2021   right breast IDC   Headache    migraines   Past Surgical History:  Procedure Laterality Date   BREAST LUMPECTOMY WITH RADIOACTIVE SEED AND SENTINEL LYMPH NODE BIOPSY Right 05/11/2021   Procedure: RIGHT BREAST LUMPECTOMY WITH RADIOACTIVE SEED AND SENTINEL LYMPH NODE BIOPSY;  Surgeon: [name], MD;  Location: [place];  Service: General;  Laterality: Right;   PORT-A-CATH REMOVAL Left 09/13/2021   Procedure: REMOVAL PORT-A-CATH;  Surgeon: [name], MD;  Location: [place];  Service: General;  Laterality: Left;   PORTACATH PLACEMENT N/A 06/09/2021   Procedure: INSERTION PORT-A-CATH;  Surgeon: [name], MD;  Location: [place];  Service: General;  Laterality: N/A;   Patient Active Problem List   Diagnosis Date Noted   Port-A-Cath in place 06/21/2021   Genetic testing 05/09/2021   Family history of breast cancer 05/05/2021   Malignant neoplasm of upper-outer quadrant of right breast in female, estrogen receptor negative ([place]) 04/28/2021    REFERRING DIAG: malignant neoplasm of right breast   THERAPY DIAG:  Aftercare following surgery for neoplasm  Abnormal posture  Malignant neoplasm of upper-outer quadrant of right breast in female, estrogen receptor negative ([place])  Rationale for Evaluation and Treatment  Rehabilitation  PERTINENT HISTORY: Patient was diagnosed on 04/28/2021 with right grade 2 invasive ductal carcinoma breast cancer. She underwent a right lumpectomy and sentinel node biopsy on 05/11/2021. She had 1 node with micromets and 2 negative nodes (3 total) removed. It is ER/PR negative and HER2 negative with a Ki67 of 40%.   PRECAUTIONS:  ongoing chemo   SUBJECTIVE: I'm doing okay, just feel tender in that same spot under my incision where the swelling comes and goes.   PAIN:  Are you having pain? No   OBJECTIVE:  UPPER EXTREMITY AROM/PROM:   A/PROM RIGHT   eval    Shoulder extension    Shoulder flexion 173  Shoulder abduction 177  Shoulder internal rotation    Shoulder external rotation                            (Blank rows = not tested)       CERVICAL AROM: All within normal limits:          UPPER EXTREMITY STRENGTH: WNL      LYMPHEDEMA ASSESSMENTS:  not tested as pt had SOZO screen yesterday and no lymphedema present        TODAY'S TREATMENT  10/13/21: Manual Therapy MFR: to cording across right pec near lumpectomy incision extending downwards towards breast in supine, Lt S/L and in sitting MLD: In Supine: Short neck, 5 diaphragmatic breaths, Rt inguinal and Lt axillary nodes, Rt axillo-inguinal and  anterior inter-axillary anastomosis, next in t S/L to work inferior to incision where some increased lymphedema noted, then retraced all steps in supine   10/11/21: Manual Therapy MFR: to cording across right pec near lumpectomy incision extending downwards towards breast in supine, Lt S/L and in sitting MLD: In Supine: Short neck, 5 diaphragmatic breaths, Rt inguinal and Lt axillary nodes, Rt axillo-inguinal and anterior inter-axillary anastomosis, next in t S/L to work near incision where pt reports feeling fullness during trip and reviewed Rt axillo-inguinal anastomosis hand placement, which pt was doing well, then retraced all steps in supine   09/22/21:   Manual Therapy MFR: to cording across right pec extending downwards towards breast in supine, Lt S/L and in sitting MLD: In Supine: Short neck, 5 diaphragmatic breaths, Rt inguinal and Lt axillary nodes, Rt axillo-inguinal and anterior inter-axillary anastomosis, then focused inferior axillary area where pt feels increased fullness at times then retraced all steps STM to lumpectomy scar  At end of session instructed pt in proper donning technique of her compression sleeve which was a good fit. Instructed her to wear this for the duration of her flight to [place] tomorrow and for a her trip as she will be active packing and moving her daughter. Pt verbalized good understanding.       PATIENT EDUCATION:  Education details: Self breast MLD Person educated: Patient Education method: Explanation, Demonstration, and Tactile cues Education comprehension: verbalized understanding and returned demonstration     HOME EXERCISE PROGRAM: Add stretch as above ; self Rt breast MLD   ASSESSMENT:   CLINICAL IMPRESSION: Pt reports feeling tenderness more so at Rt breast incision today and noticing increased lymphedema inferior to this incision. Focused manual therapy on cording that was palpable at same area today with MLD throughout session in varying positions.    OBJECTIVE IMPAIRMENTS increased fascial restrictions.      PARTICIPATION LIMITATIONS: ongoing chemo    PERSONAL FACTORS Past/current experiences are also affecting patient's functional outcome.  Pt has recently completed PT for treatment of similar symptoms    REHAB POTENTIAL: Good   CLINICAL DECISION MAKING: Stable/uncomplicated   EVALUATION COMPLEXITY: Low   GOALS: Goals reviewed with patient? Yes   LONG TERM GOALS: Target date: 09/06/2021     Pt will report that symptoms of cording are decreased by 75% and that she knows how to manage them  Baseline: Goal status: ONGOING/UPDATED GOAL -09/15/21 40% better   2.  Pt will have a  compression sleeve to wear prophylactically for flying and at high elevations  Baseline:  Goal status: ONGOING - 09/15/21 she has obtained a sleeve but will bring it in to be assessed     PLAN: PT FREQUENCY: 1-2x/week   PT DURATION: 4 weeks   PLANNED INTERVENTIONS: manual techniques, manual lymph drainage, patient  eduction, orthotic fit, therapeutic exercise    PLAN FOR NEXT SESSION:  Renewal required next session as pt would like to cont for now while undergoing radiation; Cont manual techniques as indicated;  How is self MLD going?  Otelia Limes, PTA 10/13/2021, 12:14 PM

## 2021-10-14 ENCOUNTER — Other Ambulatory Visit: Payer: Self-pay

## 2021-10-14 ENCOUNTER — Ambulatory Visit
Admission: RE | Admit: 2021-10-14 | Discharge: 2021-10-14 | Disposition: A | Discharge status: Home / Self Care | Payer: BC Managed Care – PPO | Source: Ambulatory Visit | Attending: Radiation Oncology | Admitting: Radiation Oncology

## 2021-10-14 DIAGNOSIS — Z171 Estrogen receptor negative status [ER-]: Secondary | ICD-10-CM

## 2021-10-14 DIAGNOSIS — Z510 Encounter for antineoplastic radiation therapy: Secondary | ICD-10-CM | POA: Diagnosis not present

## 2021-10-14 LAB — RAD ONC ARIA SESSION SUMMARY

## 2021-10-14 MED ORDER — ALRA NON-METALLIC DEODORANT (RAD-ONC)
1.0000 | Freq: Once | TOPICAL | Status: AC
  Administered 2021-10-14: 1 via TOPICAL

## 2021-10-14 MED ORDER — RADIAPLEXRX EX GEL: Freq: Once | CUTANEOUS | Status: AC

## 2021-10-17 ENCOUNTER — Ambulatory Visit
Admission: RE | Admit: 2021-10-17 | Discharge: 2021-10-17 | Disposition: A | Discharge status: Home / Self Care | Payer: BC Managed Care – PPO | Source: Ambulatory Visit | Attending: Radiation Oncology | Admitting: Radiation Oncology

## 2021-10-17 ENCOUNTER — Other Ambulatory Visit: Payer: Self-pay

## 2021-10-17 DIAGNOSIS — Z510 Encounter for antineoplastic radiation therapy: Secondary | ICD-10-CM | POA: Diagnosis not present

## 2021-10-17 LAB — RAD ONC ARIA SESSION SUMMARY

## 2021-10-18 ENCOUNTER — Ambulatory Visit
Admission: RE | Admit: 2021-10-18 | Discharge: 2021-10-18 | Disposition: A | Discharge status: Home / Self Care | Payer: BC Managed Care – PPO | Source: Ambulatory Visit | Attending: Radiation Oncology | Admitting: Radiation Oncology

## 2021-10-18 ENCOUNTER — Other Ambulatory Visit: Payer: Self-pay

## 2021-10-18 ENCOUNTER — Ambulatory Visit: Payer: BC Managed Care – PPO

## 2021-10-18 DIAGNOSIS — Z510 Encounter for antineoplastic radiation therapy: Secondary | ICD-10-CM | POA: Diagnosis not present

## 2021-10-18 DIAGNOSIS — R293 Abnormal posture: Secondary | ICD-10-CM

## 2021-10-18 DIAGNOSIS — C50411 Malignant neoplasm of upper-outer quadrant of right female breast: Secondary | ICD-10-CM

## 2021-10-18 DIAGNOSIS — Z483 Aftercare following surgery for neoplasm: Secondary | ICD-10-CM

## 2021-10-18 LAB — RAD ONC ARIA SESSION SUMMARY

## 2021-10-18 NOTE — Therapy (Signed)
OUTPATIENT PHYSICAL THERAPY TREATMENT NOTE   Patient Name: Natasha Ortega MRN: [id number] DOB:1970-10-26, 51 y.o., female Today's Date: 10/18/2021  PCP: Louretta Shorten  REFERRING PROVIDER: Nicholas Lose, MD  END OF SESSION:   PT End of Session - 10/18/21 1228     Visit Number 10    Number of Visits 21    Date for PT Re-Evaluation 11/15/21    PT Start Time 1003    PT Stop Time 1100    PT Time Calculation (min) 57 min    Activity Tolerance Patient tolerated treatment well    Behavior During Therapy [facility] for tasks assessed/performed             Past Medical History:  Diagnosis Date   Cancer ([place]) 04/21/2021   right breast IDC   Headache    migraines   Past Surgical History:  Procedure Laterality Date   BREAST LUMPECTOMY WITH RADIOACTIVE SEED AND SENTINEL LYMPH NODE BIOPSY Right 05/11/2021   Procedure: RIGHT BREAST LUMPECTOMY WITH RADIOACTIVE SEED AND SENTINEL LYMPH NODE BIOPSY;  Surgeon: [name], MD;  Location: [place];  Service: General;  Laterality: Right;   PORT-A-CATH REMOVAL Left 09/13/2021   Procedure: REMOVAL PORT-A-CATH;  Surgeon: [name], MD;  Location: [place];  Service: General;  Laterality: Left;   PORTACATH PLACEMENT N/A 06/09/2021   Procedure: INSERTION PORT-A-CATH;  Surgeon: [name], MD;  Location: [place];  Service: General;  Laterality: N/A;   Patient Active Problem List   Diagnosis Date Noted   Port-A-Cath in place 06/21/2021   Genetic testing 05/09/2021   Family history of breast cancer 05/05/2021   Malignant neoplasm of upper-outer quadrant of right breast in female, estrogen receptor negative ([place]) 04/28/2021    REFERRING DIAG: malignant neoplasm of right breast   THERAPY DIAG:  Aftercare following surgery for neoplasm  Abnormal posture  Malignant neoplasm of upper-outer quadrant of right breast in female, estrogen receptor negative ([place])  Rationale for Evaluation and Treatment  Rehabilitation  PERTINENT HISTORY: Patient was diagnosed on 04/28/2021 with right grade 2 invasive ductal carcinoma breast cancer. She underwent a right lumpectomy and sentinel node biopsy on 05/11/2021. She had 1 node with micromets and 2 negative nodes (3 total) removed. It is ER/PR negative and HER2 negative with a Ki67 of 40%.   PRECAUTIONS:  ongoing chemo   SUBJECTIVE: I was out in the heat off and on painting my daughter's senior parking spot at school and then we had a graduation party for my oldest daughter so I was very busy over the past few days. Due to that I noticed some new increased swelling at the lower, inner part of my breast. I tried working on it but not sure if I did it quite right.   PAIN:  Are you having pain? No   OBJECTIVE:  UPPER EXTREMITY AROM/PROM:   A/PROM RIGHT   eval    Shoulder extension    Shoulder flexion 173  Shoulder abduction 177  Shoulder internal rotation    Shoulder external rotation                            (Blank rows = not tested)       CERVICAL AROM: All within normal limits:          UPPER EXTREMITY STRENGTH: WNL      LYMPHEDEMA ASSESSMENTS:  not tested as pt had SOZO screen yesterday and no lymphedema present  TODAY'S TREATMENT  10/18/21: Manual Therapy MFR: to cording across right pec near lumpectomy incision extending downwards towards breast in supine and in sitting MLD: In Supine: Short neck, 5 diaphragmatic breaths, Rt inguinal and Lt axillary nodes, Rt axillo-inguinal and anterior inter-axillary anastomosis then focused on medial aspect of breast today where new lymphedema present. Reviewed technique with pt while performing and corrected her technique. She was able to return improved demo by end of session.   10/13/21: Manual Therapy MFR: to cording across right pec near lumpectomy incision extending downwards towards breast in supine, Lt S/L and in sitting MLD: In Supine: Short neck, 5 diaphragmatic breaths, Rt  inguinal and Lt axillary nodes, Rt axillo-inguinal and anterior inter-axillary anastomosis, next in t S/L to work inferior to incision where some increased lymphedema noted, then retraced all steps in supine   10/11/21: Manual Therapy MFR: to cording across right pec near lumpectomy incision extending downwards towards breast in supine, Lt S/L and in sitting MLD: In Supine: Short neck, 5 diaphragmatic breaths, Rt inguinal and Lt axillary nodes, Rt axillo-inguinal and anterior inter-axillary anastomosis, next in t S/L to work near incision where pt reports feeling fullness during trip and reviewed Rt axillo-inguinal anastomosis hand placement, which pt was doing well, then retraced all steps in supine       PATIENT EDUCATION:  Education details: Self breast MLD Person educated: Patient Education method: Explanation, Demonstration, and Tactile cues Education comprehension: verbalized understanding and returned demonstration     HOME EXERCISE PROGRAM: Add stretch as above ; self Rt breast MLD   ASSESSMENT:   CLINICAL IMPRESSION: Pt had increased lymphedema present in her Rt breast medial breast over the weekend. She demonstrated how she performed self MLD which required correction today. Her pressure was slightly firmer than needed and her hand technique required correction (had fingers splayed instead of adducted). She did have peau d'orange at medial aspect of breast which was new, however the skin was loose showing where it had been increased but was now reducing. Focused MLD here today and continued working on cording at breast incision as well. Pt is beginning to become independent with self MLD but with benefit from further review. Also anticipate possible increase of breast lymphedema as she is currently undergoing radiation so she will benefit from continued physical therapy at this time to help her manage ongoing symptoms. Renewal done this session for 1-2x/wk for up to 4 more weeks for  symptom management throughout her radiation treatments.    OBJECTIVE IMPAIRMENTS increased fascial restrictions.      PARTICIPATION LIMITATIONS: ongoing chemo    PERSONAL FACTORS Past/current experiences are also affecting patient's functional outcome.  Pt has recently completed PT for treatment of similar symptoms    REHAB POTENTIAL: Good   CLINICAL DECISION MAKING: Stable/uncomplicated   EVALUATION COMPLEXITY: Low   GOALS: Goals reviewed with patient? Yes   LONG TERM GOALS: Target date:  11/15/2021   Pt will report that symptoms of cording are decreased by 75% and that she knows how to manage them  Baseline: Goal status: PARTIALLY MET -09/15/21 40% better; pt did not rate today but reports though still present, this is much improved - 10/18/21   2.  Pt will have a compression sleeve to wear prophylactically for flying and at high elevations  Baseline:  Goal status: MET - 09/15/21 she has obtained a sleeve but will bring it in to be assessed; pt has received sleeve and wore this to fly recently - 10/18/21 3.  Pt will demonstrate a 75% improvement in new swelling in Rt breast.    Baseline: New swelling noted with peau d'orange  Goal Status: NEW - 10/18/21   PLAN: PT FREQUENCY: 1-2x/week   PT DURATION: 4 weeks   PLANNED INTERVENTIONS: manual techniques, manual lymph drainage, patient  eduction, orthotic fit, therapeutic exercise    PLAN FOR NEXT SESSION:  Renewal done this session; Cont manual techniques as indicated; self MLD improved since this session after review?   Otelia Limes, PTA 10/18/2021, 12:29 PM

## 2021-10-18 NOTE — Addendum Note (Signed)
Addended by: Manus Gunning L on: 10/18/2021 02:55 PM   Modules accepted: Orders

## 2021-10-19 ENCOUNTER — Other Ambulatory Visit: Payer: Self-pay

## 2021-10-19 ENCOUNTER — Ambulatory Visit
Admission: RE | Admit: 2021-10-19 | Discharge: 2021-10-19 | Disposition: A | Discharge status: Home / Self Care | Payer: BC Managed Care – PPO | Source: Ambulatory Visit | Attending: Radiation Oncology | Admitting: Radiation Oncology

## 2021-10-19 DIAGNOSIS — Z510 Encounter for antineoplastic radiation therapy: Secondary | ICD-10-CM | POA: Diagnosis not present

## 2021-10-19 LAB — RAD ONC ARIA SESSION SUMMARY

## 2021-10-20 ENCOUNTER — Ambulatory Visit: Payer: BC Managed Care – PPO

## 2021-10-20 ENCOUNTER — Other Ambulatory Visit: Payer: Self-pay

## 2021-10-20 ENCOUNTER — Ambulatory Visit
Admission: RE | Admit: 2021-10-20 | Discharge: 2021-10-20 | Disposition: A | Discharge status: Home / Self Care | Payer: BC Managed Care – PPO | Source: Ambulatory Visit | Attending: Radiation Oncology | Admitting: Radiation Oncology

## 2021-10-20 DIAGNOSIS — R293 Abnormal posture: Secondary | ICD-10-CM

## 2021-10-20 DIAGNOSIS — Z510 Encounter for antineoplastic radiation therapy: Secondary | ICD-10-CM | POA: Diagnosis not present

## 2021-10-20 DIAGNOSIS — Z483 Aftercare following surgery for neoplasm: Secondary | ICD-10-CM

## 2021-10-20 DIAGNOSIS — Z171 Estrogen receptor negative status [ER-]: Secondary | ICD-10-CM

## 2021-10-20 LAB — RAD ONC ARIA SESSION SUMMARY

## 2021-10-20 NOTE — Therapy (Signed)
OUTPATIENT PHYSICAL THERAPY TREATMENT NOTE   Patient Name: Natasha Ortega MRN: [id number] DOB:[birth year], 51 y.o., female Today's Date: 10/20/2021  PCP: Louretta Shorten  REFERRING PROVIDER: Nicholas Lose, MD  END OF SESSION:   PT End of Session - 10/20/21 1003     Visit Number 11    Number of Visits 21    Date for PT Re-Evaluation 11/15/21    PT Start Time 1003    PT Stop Time 1104    PT Time Calculation (min) 61 min    Activity Tolerance Patient tolerated treatment well    Behavior During Therapy [facility] for tasks assessed/performed             Past Medical History:  Diagnosis Date   Cancer ([place]) 04/21/2021   right breast IDC   Headache    migraines   Past Surgical History:  Procedure Laterality Date   BREAST LUMPECTOMY WITH RADIOACTIVE SEED AND SENTINEL LYMPH NODE BIOPSY Right 05/11/2021   Procedure: RIGHT BREAST LUMPECTOMY WITH RADIOACTIVE SEED AND SENTINEL LYMPH NODE BIOPSY;  Surgeon: [name], MD;  Location: [place];  Service: General;  Laterality: Right;   PORT-A-CATH REMOVAL Left 09/13/2021   Procedure: REMOVAL PORT-A-CATH;  Surgeon: [name], MD;  Location: [place];  Service: General;  Laterality: Left;   PORTACATH PLACEMENT N/A 06/09/2021   Procedure: INSERTION PORT-A-CATH;  Surgeon: [name], MD;  Location: [place];  Service: General;  Laterality: N/A;   Patient Active Problem List   Diagnosis Date Noted   Port-A-Cath in place 06/21/2021   Genetic testing 05/09/2021   Family history of breast cancer 05/05/2021   Malignant neoplasm of upper-outer quadrant of right breast in female, estrogen receptor negative ([place]) 04/28/2021    REFERRING DIAG: malignant neoplasm of right breast   THERAPY DIAG:  Aftercare following surgery for neoplasm  Abnormal posture  Malignant neoplasm of upper-outer quadrant of right breast in female, estrogen receptor negative ([place])  Rationale for Evaluation and Treatment  Rehabilitation  PERTINENT HISTORY: Patient was diagnosed on 04/28/2021 with right grade 2 invasive ductal carcinoma breast cancer. She underwent a right lumpectomy and sentinel node biopsy on 05/11/2021. She had 1 node with micromets and 2 negative nodes (3 total) removed. It is ER/PR negative and HER2 negative with a Ki67 of 40%.   PRECAUTIONS:  ongoing chemo   SUBJECTIVE: I feel good today. I'm still a little sore at the lateral aspect of my breast and up to the axilla. But it's not bad. I just feel better in general today.   PAIN:  Are you having pain? No   OBJECTIVE:  UPPER EXTREMITY AROM/PROM:   A/PROM RIGHT   eval    Shoulder extension    Shoulder flexion 173  Shoulder abduction 177  Shoulder internal rotation    Shoulder external rotation                            (Blank rows = not tested)       CERVICAL AROM: All within normal limits:          UPPER EXTREMITY STRENGTH: WNL      LYMPHEDEMA ASSESSMENTS:  not tested as pt had SOZO screen yesterday and no lymphedema present        TODAY'S TREATMENT  10/20/21: Manual Therapy STM in supine to Rt axilla and lateral trunk where palpable tightness present today, then same in Lt S/L with cocoa butter and also  to medial scap border here, since pt has already had radiation for today MLD: In Supine: Short neck, 5 diaphragmatic breaths, Rt inguinal and Lt axillary nodes, Rt axillo-inguinal and anterior inter-axillary anastomosis then continued to focused on medial aspect of breast today where new lymphedema present; also in Lt S/L for further work along Rt axillo-inguinal anastomosis  10/18/21: Manual Therapy MFR: to cording across right pec near lumpectomy incision extending downwards towards breast in supine and in sitting MLD: In Supine: Short neck, 5 diaphragmatic breaths, Rt inguinal and Lt axillary nodes, Rt axillo-inguinal and anterior inter-axillary anastomosis then focused on medial aspect of breast today where new  lymphedema present. Reviewed technique with pt while performing and corrected her technique. She was able to return improved demo by end of session.   10/13/21: Manual Therapy MFR: to cording across right pec near lumpectomy incision extending downwards towards breast in supine, Lt S/L and in sitting MLD: In Supine: Short neck, 5 diaphragmatic breaths, Rt inguinal and Lt axillary nodes, Rt axillo-inguinal and anterior inter-axillary anastomosis, next in t S/L to work inferior to incision where some increased lymphedema noted, then retraced all steps in supine      PATIENT EDUCATION:  Education details: Self breast MLD Person educated: Patient Education method: Consulting civil engineer, Demonstration, and Tactile cues Education comprehension: verbalized understanding and returned demonstration     HOME EXERCISE PROGRAM: Add stretch as above ; self Rt breast MLD   ASSESSMENT:   CLINICAL IMPRESSION: Pt comes in reporting she is feeling much improved since last session physically and mentally. Her newer lymphedema is still present at medial aspect of breast but much improved as skin is softer and edema very mildly palpable. Issued 1/2" gray foam for her to wear at medial aspect of breast in her compression bra. Pt reports feeling better by end of session.    OBJECTIVE IMPAIRMENTS increased fascial restrictions.      PARTICIPATION LIMITATIONS: ongoing chemo    PERSONAL FACTORS Past/current experiences are also affecting patient's functional outcome.  Pt has recently completed PT for treatment of similar symptoms    REHAB POTENTIAL: Good   CLINICAL DECISION MAKING: Stable/uncomplicated   EVALUATION COMPLEXITY: Low   GOALS: Goals reviewed with patient? Yes   LONG TERM GOALS: Target date:  11/15/2021   Pt will report that symptoms of cording are decreased by 75% and that she knows how to manage them  Baseline: Goal status: PARTIALLY MET -09/15/21 40% better; pt did not rate today but reports  though still present, this is much improved - 10/18/21   2.  Pt will have a compression sleeve to wear prophylactically for flying and at high elevations  Baseline:  Goal status: MET - 09/15/21 she has obtained a sleeve but will bring it in to be assessed; pt has received sleeve and wore this to fly recently - 10/18/21 3. Pt will demonstrate a 75% improvement in new swelling in Rt breast.    Baseline: New swelling noted with peau d'orange  Goal Status: NEW - 10/18/21   PLAN: PT FREQUENCY: 1-2x/week   PT DURATION: 4 weeks   PLANNED INTERVENTIONS: manual techniques, manual lymph drainage, patient  eduction, orthotic fit, therapeutic exercise    PLAN FOR NEXT SESSION:  Cont manual techniques as indicated; self MLD improved since this session after review?   Otelia Limes, PTA 10/20/2021, 11:26 AM

## 2021-10-21 ENCOUNTER — Other Ambulatory Visit: Payer: Self-pay

## 2021-10-21 ENCOUNTER — Ambulatory Visit
Admission: RE | Admit: 2021-10-21 | Discharge: 2021-10-21 | Disposition: A | Discharge status: Home / Self Care | Payer: BC Managed Care – PPO | Source: Ambulatory Visit | Attending: Radiation Oncology | Admitting: Radiation Oncology

## 2021-10-21 DIAGNOSIS — Z510 Encounter for antineoplastic radiation therapy: Secondary | ICD-10-CM | POA: Insufficient documentation

## 2021-10-21 DIAGNOSIS — Z9221 Personal history of antineoplastic chemotherapy: Secondary | ICD-10-CM | POA: Insufficient documentation

## 2021-10-21 DIAGNOSIS — C50411 Malignant neoplasm of upper-outer quadrant of right female breast: Secondary | ICD-10-CM | POA: Insufficient documentation

## 2021-10-21 DIAGNOSIS — R413 Other amnesia: Secondary | ICD-10-CM | POA: Insufficient documentation

## 2021-10-21 DIAGNOSIS — Z171 Estrogen receptor negative status [ER-]: Secondary | ICD-10-CM | POA: Insufficient documentation

## 2021-10-21 LAB — RAD ONC ARIA SESSION SUMMARY

## 2021-10-25 ENCOUNTER — Ambulatory Visit
Admission: RE | Admit: 2021-10-25 | Discharge: 2021-10-25 | Disposition: A | Discharge status: Home / Self Care | Payer: BC Managed Care – PPO | Source: Ambulatory Visit | Attending: Radiation Oncology | Admitting: Radiation Oncology

## 2021-10-25 ENCOUNTER — Other Ambulatory Visit: Payer: Self-pay

## 2021-10-25 DIAGNOSIS — Z510 Encounter for antineoplastic radiation therapy: Secondary | ICD-10-CM | POA: Diagnosis not present

## 2021-10-25 LAB — RAD ONC ARIA SESSION SUMMARY
Course Elapsed Days: 14
Plan Fractions Treated to Date: 10
Plan Fractions Treated to Date: 10
Plan Prescribed Dose Per Fraction: 1.8 Gy
Plan Prescribed Dose Per Fraction: 1.8 Gy
Plan Total Fractions Prescribed: 28
Plan Total Fractions Prescribed: 28
Plan Total Prescribed Dose: 50.4 Gy
Plan Total Prescribed Dose: 50.4 Gy
Reference Point Dosage Given to Date: 18 Gy
Reference Point Dosage Given to Date: 18 Gy
Reference Point Session Dosage Given: 1.8 Gy
Reference Point Session Dosage Given: 1.8 Gy
Session Number: 10

## 2021-10-26 ENCOUNTER — Ambulatory Visit: Payer: BC Managed Care – PPO | Attending: Hematology and Oncology

## 2021-10-26 ENCOUNTER — Ambulatory Visit
Admission: RE | Admit: 2021-10-26 | Discharge: 2021-10-26 | Disposition: A | Discharge status: Home / Self Care | Payer: BC Managed Care – PPO | Source: Ambulatory Visit | Attending: Radiation Oncology | Admitting: Radiation Oncology

## 2021-10-26 ENCOUNTER — Other Ambulatory Visit: Payer: Self-pay

## 2021-10-26 DIAGNOSIS — R293 Abnormal posture: Secondary | ICD-10-CM | POA: Insufficient documentation

## 2021-10-26 DIAGNOSIS — C50411 Malignant neoplasm of upper-outer quadrant of right female breast: Secondary | ICD-10-CM | POA: Insufficient documentation

## 2021-10-26 DIAGNOSIS — I890 Lymphedema, not elsewhere classified: Secondary | ICD-10-CM | POA: Diagnosis present

## 2021-10-26 DIAGNOSIS — Z483 Aftercare following surgery for neoplasm: Secondary | ICD-10-CM | POA: Insufficient documentation

## 2021-10-26 DIAGNOSIS — Z171 Estrogen receptor negative status [ER-]: Secondary | ICD-10-CM | POA: Insufficient documentation

## 2021-10-26 DIAGNOSIS — Z510 Encounter for antineoplastic radiation therapy: Secondary | ICD-10-CM | POA: Diagnosis not present

## 2021-10-26 LAB — RAD ONC ARIA SESSION SUMMARY

## 2021-10-26 NOTE — Therapy (Signed)
OUTPATIENT PHYSICAL THERAPY TREATMENT NOTE   Patient Name: Natasha Ortega MRN: [id number] DOB:1970/07/20, 51 y.o., female Today's Date: 10/26/2021  PCP: Louretta Shorten  REFERRING PROVIDER: Nicholas Lose, MD  END OF SESSION:   PT End of Session - 10/26/21 1107     Visit Number 12    Number of Visits 21    Date for PT Re-Evaluation 11/15/21    PT Start Time 1105    PT Stop Time 1158    PT Time Calculation (min) 53 min    Activity Tolerance Patient tolerated treatment well    Behavior During Therapy [facility] for tasks assessed/performed             Past Medical History:  Diagnosis Date   Cancer ([place]) 04/21/2021   right breast IDC   Headache    migraines   Past Surgical History:  Procedure Laterality Date   BREAST LUMPECTOMY WITH RADIOACTIVE SEED AND SENTINEL LYMPH NODE BIOPSY Right 05/11/2021   Procedure: RIGHT BREAST LUMPECTOMY WITH RADIOACTIVE SEED AND SENTINEL LYMPH NODE BIOPSY;  Surgeon: [name], MD;  Location: [place];  Service: General;  Laterality: Right;   PORT-A-CATH REMOVAL Left 09/13/2021   Procedure: REMOVAL PORT-A-CATH;  Surgeon: [name], MD;  Location: [place];  Service: General;  Laterality: Left;   PORTACATH PLACEMENT N/A 06/09/2021   Procedure: INSERTION PORT-A-CATH;  Surgeon: [name], MD;  Location: [place];  Service: General;  Laterality: N/A;   Patient Active Problem List   Diagnosis Date Noted   Port-A-Cath in place 06/21/2021   Genetic testing 05/09/2021   Family history of breast cancer 05/05/2021   Malignant neoplasm of upper-outer quadrant of right breast in female, estrogen receptor negative ([place]) 04/28/2021    REFERRING DIAG: malignant neoplasm of right breast   THERAPY DIAG:  Aftercare following surgery for neoplasm  Abnormal posture  Malignant neoplasm of upper-outer quadrant of right breast in female, estrogen receptor negative ([place])  Rationale for Evaluation and Treatment  Rehabilitation  PERTINENT HISTORY: Patient was diagnosed on 04/28/2021 with right grade 2 invasive ductal carcinoma breast cancer. She underwent a right lumpectomy and sentinel node biopsy on 05/11/2021. She had 1 node with micromets and 2 negative nodes (3 total) removed. It is ER/PR negative and HER2 negative with a Ki67 of 40%.   PRECAUTIONS:  ongoing chemo   SUBJECTIVE: I noticed a bit more orange peel dimpling on the medial aspect of my Rt breast after radiation today. I also sat in the sun a lot for soccer games this weekend. I was under an umbrella but it was so hot.   PAIN:  Are you having pain? No   OBJECTIVE:  UPPER EXTREMITY AROM/PROM:   A/PROM RIGHT   eval    Shoulder extension    Shoulder flexion 173  Shoulder abduction 177  Shoulder internal rotation    Shoulder external rotation                            (Blank rows = not tested)       CERVICAL AROM: All within normal limits:          UPPER EXTREMITY STRENGTH: WNL      LYMPHEDEMA ASSESSMENTS:  not tested as pt had SOZO screen yesterday and no lymphedema present        TODAY'S TREATMENT  10/26/21: Manual Therapy STM in supine to Rt axilla and lateral trunk where palpable tightness present today,though  improved since last session  MLD: In Supine: Short neck, superficial and deep abdominals, Lt axillary nodes, Lt intact upper quadrant, then anterior inter-axillary anastomosis, next Rt inguinal nodes and Rt axillo-inguinal, then continued to focused on medial aspect of breast; also in Lt S/L for further work along Rt axillo-inguinal anastomosis and at incision where some mild edema present inferior to incision MFR to Rt axilla in end Rt shoulder flexion and D2  10/20/21: Manual Therapy STM in supine to Rt axilla and lateral trunk where palpable tightness present today, then same in Lt S/L with cocoa butter and also to medial scap border here, since pt has already had radiation for today MLD: In Supine: Short  neck, 5 diaphragmatic breaths, Rt inguinal and Lt axillary nodes, Rt axillo-inguinal and anterior inter-axillary anastomosis then continued to focused on medial aspect of breast today where new lymphedema present; also in Lt S/L for further work along Rt axillo-inguinal anastomosis  10/18/21: Manual Therapy MFR: to cording across right pec near lumpectomy incision extending downwards towards breast in supine and in sitting MLD: In Supine: Short neck, 5 diaphragmatic breaths, Rt inguinal and Lt axillary nodes, Rt axillo-inguinal and anterior inter-axillary anastomosis then focused on medial aspect of breast today where new lymphedema present. Reviewed technique with pt while performing and corrected her technique. She was able to return improved demo by end of session.      PATIENT EDUCATION:  Education details: Self breast MLD Person educated: Patient Education method: Explanation, Demonstration, and Tactile cues Education comprehension: verbalized understanding and returned demonstration     HOME EXERCISE PROGRAM: Add stretch as above ; self Rt breast MLD   ASSESSMENT:   CLINICAL IMPRESSION: Pt comes in reporting feeling like her medial breast has more widespread area of peau d'orange since radiation this morning. She also reports she was out in the sun a lot this weekend for her daughters soccer games though was under an umbrella. She has been consistent with her self MLD and stretching daily, 2-3x/day but reports her lymphedema only mildly improved when she works on herself as opposed to when therapist works on her. Reminded her also that it is not unlikely to see some worsening of symptoms during radiation as this is causing acute trauma to her Rt upper quadrant daily and her lymphatic system is what heals her, however, her lymphatic system is also trying to recover from it's recent surgical trauma. Pt reports good understanding of this and appreciative of this being explained to her as she  feels frustrated at lack of progress at times. Pt will benefit from continued physical therapy at his time to help her manage her lymphedema symptoms while she is currently undergoing radiation which is seeming to worsen her symptoms.    OBJECTIVE IMPAIRMENTS increased fascial restrictions.      PARTICIPATION LIMITATIONS: ongoing chemo    PERSONAL FACTORS Past/current experiences are also affecting patient's functional outcome.  Pt has recently completed PT for treatment of similar symptoms    REHAB POTENTIAL: Good   CLINICAL DECISION MAKING: Stable/uncomplicated   EVALUATION COMPLEXITY: Low   GOALS: Goals reviewed with patient? Yes   LONG TERM GOALS: Target date:  11/15/2021   Pt will report that symptoms of cording are decreased by 75% and that she knows how to manage them  Baseline: Goal status: PARTIALLY MET -09/15/21 40% better; pt did not rate today but reports though still present, this is much improved - 10/18/21   2.  Pt will have a compression sleeve  to wear prophylactically for flying and at high elevations  Baseline:  Goal status: MET - 09/15/21 she has obtained a sleeve but will bring it in to be assessed; pt has received sleeve and wore this to fly recently - 10/18/21 3. Pt will demonstrate a 75% improvement in new swelling in Rt breast.    Baseline: New swelling noted with peau d'orange  Goal Status: NEW - 10/18/21   PLAN: PT FREQUENCY: 1-2x/week   PT DURATION: 4 weeks   PLANNED INTERVENTIONS: manual techniques, manual lymph drainage, patient  eduction, orthotic fit, therapeutic exercise    PLAN FOR NEXT SESSION:  Cont manual techniques as indicated; self MLD improved since this session after review? How is larger are of peau d'orange at medial breast? See if other compression foam needed/chip pack?  Otelia Limes, PTA 10/26/2021, 12:28 PM

## 2021-10-27 ENCOUNTER — Ambulatory Visit
Admission: RE | Admit: 2021-10-27 | Discharge: 2021-10-27 | Disposition: A | Discharge status: Home / Self Care | Payer: BC Managed Care – PPO | Source: Ambulatory Visit | Attending: Radiation Oncology | Admitting: Radiation Oncology

## 2021-10-27 ENCOUNTER — Other Ambulatory Visit: Payer: Self-pay

## 2021-10-27 DIAGNOSIS — Z510 Encounter for antineoplastic radiation therapy: Secondary | ICD-10-CM | POA: Diagnosis not present

## 2021-10-27 LAB — RAD ONC ARIA SESSION SUMMARY
Course Elapsed Days: 16
Plan Fractions Treated to Date: 12
Plan Fractions Treated to Date: 12
Plan Prescribed Dose Per Fraction: 1.8 Gy
Plan Prescribed Dose Per Fraction: 1.8 Gy
Plan Total Fractions Prescribed: 28
Plan Total Fractions Prescribed: 28
Plan Total Prescribed Dose: 50.4 Gy
Plan Total Prescribed Dose: 50.4 Gy
Reference Point Dosage Given to Date: 21.6 Gy
Reference Point Dosage Given to Date: 21.6 Gy
Reference Point Session Dosage Given: 1.8 Gy
Reference Point Session Dosage Given: 1.8 Gy
Session Number: 12

## 2021-10-28 ENCOUNTER — Other Ambulatory Visit: Payer: Self-pay

## 2021-10-28 ENCOUNTER — Inpatient Hospital Stay: Payer: BC Managed Care – PPO | Admitting: Hematology and Oncology

## 2021-10-28 ENCOUNTER — Ambulatory Visit: Payer: BC Managed Care – PPO

## 2021-10-28 ENCOUNTER — Ambulatory Visit
Admission: RE | Admit: 2021-10-28 | Discharge: 2021-10-28 | Disposition: A | Discharge status: Home / Self Care | Payer: BC Managed Care – PPO | Source: Ambulatory Visit | Attending: Radiation Oncology | Admitting: Radiation Oncology

## 2021-10-28 DIAGNOSIS — R293 Abnormal posture: Secondary | ICD-10-CM

## 2021-10-28 DIAGNOSIS — Z483 Aftercare following surgery for neoplasm: Secondary | ICD-10-CM | POA: Diagnosis not present

## 2021-10-28 DIAGNOSIS — Z171 Estrogen receptor negative status [ER-]: Secondary | ICD-10-CM

## 2021-10-28 DIAGNOSIS — Z510 Encounter for antineoplastic radiation therapy: Secondary | ICD-10-CM | POA: Diagnosis not present

## 2021-10-28 LAB — RAD ONC ARIA SESSION SUMMARY
Course Elapsed Days: 17
Plan Fractions Treated to Date: 13
Plan Fractions Treated to Date: 13
Plan Prescribed Dose Per Fraction: 1.8 Gy
Plan Prescribed Dose Per Fraction: 1.8 Gy
Plan Total Fractions Prescribed: 28
Plan Total Fractions Prescribed: 28
Plan Total Prescribed Dose: 50.4 Gy
Plan Total Prescribed Dose: 50.4 Gy
Reference Point Dosage Given to Date: 23.4 Gy
Reference Point Dosage Given to Date: 23.4 Gy
Reference Point Session Dosage Given: 1.8 Gy
Reference Point Session Dosage Given: 1.8 Gy
Session Number: 13

## 2021-10-28 NOTE — Therapy (Signed)
OUTPATIENT PHYSICAL THERAPY TREATMENT NOTE   Patient Name: Natasha Ortega MRN: [id number] DOB:10/30/1970, 51 y.o., female Today's Date: 10/28/2021  PCP: Louretta Shorten  REFERRING PROVIDER: Nicholas Lose, MD  END OF SESSION:   PT End of Session - 10/28/21 1010     Visit Number 13    Number of Visits 21    Date for PT Re-Evaluation 11/15/21    PT Start Time 1005    PT Stop Time 1101    PT Time Calculation (min) 56 min    Activity Tolerance Patient tolerated treatment well    Behavior During Therapy [facility] for tasks assessed/performed             Past Medical History:  Diagnosis Date   Cancer ([place]) 04/21/2021   right breast IDC   Headache    migraines   Past Surgical History:  Procedure Laterality Date   BREAST LUMPECTOMY WITH RADIOACTIVE SEED AND SENTINEL LYMPH NODE BIOPSY Right 05/11/2021   Procedure: RIGHT BREAST LUMPECTOMY WITH RADIOACTIVE SEED AND SENTINEL LYMPH NODE BIOPSY;  Surgeon: [name], MD;  Location: [place];  Service: General;  Laterality: Right;   PORT-A-CATH REMOVAL Left 09/13/2021   Procedure: REMOVAL PORT-A-CATH;  Surgeon: [name], MD;  Location: [place];  Service: General;  Laterality: Left;   PORTACATH PLACEMENT N/A 06/09/2021   Procedure: INSERTION PORT-A-CATH;  Surgeon: [name], MD;  Location: [place];  Service: General;  Laterality: N/A;   Patient Active Problem List   Diagnosis Date Noted   Port-A-Cath in place 06/21/2021   Genetic testing 05/09/2021   Family history of breast cancer 05/05/2021   Malignant neoplasm of upper-outer quadrant of right breast in female, estrogen receptor negative ([place]) 04/28/2021    REFERRING DIAG: malignant neoplasm of right breast   THERAPY DIAG:  Aftercare following surgery for neoplasm  Abnormal posture  Malignant neoplasm of upper-outer quadrant of right breast in female, estrogen receptor negative ([place])  Rationale for Evaluation and Treatment  Rehabilitation  PERTINENT HISTORY: Patient was diagnosed on 04/28/2021 with right grade 2 invasive ductal carcinoma breast cancer. She underwent a right lumpectomy and sentinel node biopsy on 05/11/2021. She had 1 node with micromets and 2 negative nodes (3 total) removed. It is ER/PR negative and HER2 negative with a Ki67 of 40%.   PRECAUTIONS:  ongoing chemo   SUBJECTIVE: I had stopped wearing the 1/4" gray foam and remembered to use it yesterday and I can tell it really helped lessen the medial new area of breast lymphedema.   PAIN:  Are you having pain? No   OBJECTIVE:  UPPER EXTREMITY AROM/PROM:   A/PROM RIGHT   eval    Shoulder extension    Shoulder flexion 173  Shoulder abduction 177  Shoulder internal rotation    Shoulder external rotation                            (Blank rows = not tested)       CERVICAL AROM: All within normal limits:          UPPER EXTREMITY STRENGTH: WNL      LYMPHEDEMA ASSESSMENTS:  not tested as pt had SOZO screen yesterday and no lymphedema present        TODAY'S TREATMENT   10/28/21: Manual Therapy STM in supine to Rt axilla and lateral trunk where palpable tightness present today,though improved since last session  MLD: In Supine: Short neck, superficial and  deep abdominals, Lt axillary nodes, Lt intact upper quadrant, then anterior inter-axillary anastomosis, next Rt inguinal nodes and Rt axillo-inguinal, then continued to focused on medial aspect of breast; also in Lt S/L for further work along Rt axillo-inguinal anastomosis and at incision where some mod edema present inferior to incision which is increasing the presence of her cording here today. MFR to Rt axilla in end Rt shoulder flexion  10/26/21: Manual Therapy STM in supine to Rt axilla and lateral trunk where palpable tightness present today,though improved since last session  MLD: In Supine: Short neck, superficial and deep abdominals, Lt axillary nodes, Lt intact upper  quadrant, then anterior inter-axillary anastomosis, next Rt inguinal nodes and Rt axillo-inguinal, then continued to focused on medial aspect of breast; also in Lt S/L for further work along Rt axillo-inguinal anastomosis and at incision where some mild edema present inferior to incision MFR to Rt axilla in end Rt shoulder flexion and D2  10/20/21: Manual Therapy STM in supine to Rt axilla and lateral trunk where palpable tightness present today, then same in Lt S/L with cocoa butter and also to medial scap border here, since pt has already had radiation for today MLD: In Supine: Short neck, 5 diaphragmatic breaths, Rt inguinal and Lt axillary nodes, Rt axillo-inguinal and anterior inter-axillary anastomosis then continued to focused on medial aspect of breast today where new lymphedema present; also in Lt S/L for further work along Rt axillo-inguinal anastomosis    PATIENT EDUCATION:  Education details: Self breast MLD Person educated: Patient Education method: Consulting civil engineer, Media planner, and Corporate treasurer cues Education comprehension: verbalized understanding and returned demonstration     HOME EXERCISE PROGRAM: Add stretch as above ; self Rt breast MLD   ASSESSMENT:   CLINICAL IMPRESSION: Pt reports that she resumed wear of her 1/4" gray foam at the medial aspect of her breast and that this helped to reduce the newer lymphedema present here since starting radiation. Focused MLD to this area and at her incision when she was in S/L. Both areas seemed much improved by evidenced of being softer and less fibrotic feeling. Also her cording was less palpable by end of session at her incision as well. Made pt a smaller chip pack she could wear at the medial aspect when she feels this area has increased lymphedema.    OBJECTIVE IMPAIRMENTS increased fascial restrictions.      PARTICIPATION LIMITATIONS: ongoing chemo    PERSONAL FACTORS Past/current experiences are also affecting patient's functional  outcome.  Pt has recently completed PT for treatment of similar symptoms    REHAB POTENTIAL: Good   CLINICAL DECISION MAKING: Stable/uncomplicated   EVALUATION COMPLEXITY: Low   GOALS: Goals reviewed with patient? Yes   LONG TERM GOALS: Target date:  11/15/2021   Pt will report that symptoms of cording are decreased by 75% and that she knows how to manage them  Baseline: Goal status: PARTIALLY MET -09/15/21 40% better; pt did not rate today but reports though still present, this is much improved - 10/18/21   2.  Pt will have a compression sleeve to wear prophylactically for flying and at high elevations  Baseline:  Goal status: MET - 09/15/21 she has obtained a sleeve but will bring it in to be assessed; pt has received sleeve and wore this to fly recently - 10/18/21 3. Pt will demonstrate a 75% improvement in new swelling in Rt breast.    Baseline: New swelling noted with peau d'orange  Goal Status: NEW - 10/18/21  PLAN: PT FREQUENCY: 1-2x/week   PT DURATION: 4 weeks   PLANNED INTERVENTIONS: manual techniques, manual lymph drainage, patient  eduction, orthotic fit, therapeutic exercise    PLAN FOR NEXT SESSION:  Cont manual techniques as indicated; self MLD improved since this session after review? How is new chip pack at medial breast?  Otelia Limes, PTA 10/28/2021, 11:33 AM

## 2021-10-31 ENCOUNTER — Other Ambulatory Visit: Payer: Self-pay

## 2021-10-31 ENCOUNTER — Ambulatory Visit
Admission: RE | Admit: 2021-10-31 | Discharge: 2021-10-31 | Disposition: A | Discharge status: Home / Self Care | Payer: BC Managed Care – PPO | Source: Ambulatory Visit | Attending: Radiation Oncology | Admitting: Radiation Oncology

## 2021-10-31 ENCOUNTER — Ambulatory Visit: Payer: BC Managed Care – PPO

## 2021-10-31 DIAGNOSIS — Z483 Aftercare following surgery for neoplasm: Secondary | ICD-10-CM

## 2021-10-31 DIAGNOSIS — R293 Abnormal posture: Secondary | ICD-10-CM

## 2021-10-31 DIAGNOSIS — Z171 Estrogen receptor negative status [ER-]: Secondary | ICD-10-CM

## 2021-10-31 DIAGNOSIS — Z510 Encounter for antineoplastic radiation therapy: Secondary | ICD-10-CM | POA: Diagnosis not present

## 2021-10-31 LAB — RAD ONC ARIA SESSION SUMMARY
Course Elapsed Days: 20
Plan Fractions Treated to Date: 14
Plan Fractions Treated to Date: 14
Plan Prescribed Dose Per Fraction: 1.8 Gy
Plan Prescribed Dose Per Fraction: 1.8 Gy
Plan Total Fractions Prescribed: 28
Plan Total Fractions Prescribed: 28
Plan Total Prescribed Dose: 50.4 Gy
Plan Total Prescribed Dose: 50.4 Gy
Reference Point Dosage Given to Date: 25.2 Gy
Reference Point Dosage Given to Date: 25.2 Gy
Reference Point Session Dosage Given: 1.8 Gy
Reference Point Session Dosage Given: 1.8 Gy
Session Number: 14

## 2021-10-31 NOTE — Therapy (Signed)
OUTPATIENT PHYSICAL THERAPY TREATMENT NOTE   Patient Name: Natasha Ortega MRN: [id number] DOB:[birth year], 51 y.o., female Today's Date: 10/31/2021  PCP: Louretta Shorten  REFERRING PROVIDER: Nicholas Lose, MD  END OF SESSION:   PT End of Session - 10/31/21 1114     Visit Number 14    Number of Visits 21    Date for PT Re-Evaluation 11/15/21    PT Start Time 1106    PT Stop Time 1200    PT Time Calculation (min) 54 min    Activity Tolerance Patient tolerated treatment well    Behavior During Therapy [facility] for tasks assessed/performed             Past Medical History:  Diagnosis Date   Cancer ([place]) 04/21/2021   right breast IDC   Headache    migraines   Past Surgical History:  Procedure Laterality Date   BREAST LUMPECTOMY WITH RADIOACTIVE SEED AND SENTINEL LYMPH NODE BIOPSY Right 05/11/2021   Procedure: RIGHT BREAST LUMPECTOMY WITH RADIOACTIVE SEED AND SENTINEL LYMPH NODE BIOPSY;  Surgeon: [name], MD;  Location: [place];  Service: General;  Laterality: Right;   PORT-A-CATH REMOVAL Left 09/13/2021   Procedure: REMOVAL PORT-A-CATH;  Surgeon: [name], MD;  Location: [place];  Service: General;  Laterality: Left;   PORTACATH PLACEMENT N/A 06/09/2021   Procedure: INSERTION PORT-A-CATH;  Surgeon: [name], MD;  Location: [place];  Service: General;  Laterality: N/A;   Patient Active Problem List   Diagnosis Date Noted   Port-A-Cath in place 06/21/2021   Genetic testing 05/09/2021   Family history of breast cancer 05/05/2021   Malignant neoplasm of upper-outer quadrant of right breast in female, estrogen receptor negative ([place]) 04/28/2021    REFERRING DIAG: malignant neoplasm of right breast   THERAPY DIAG:  Aftercare following surgery for neoplasm  Abnormal posture  Malignant neoplasm of upper-outer quadrant of right breast in female, estrogen receptor negative ([place])  Rationale for Evaluation and Treatment  Rehabilitation  PERTINENT HISTORY: Patient was diagnosed on 04/28/2021 with right grade 2 invasive ductal carcinoma breast cancer. She underwent a right lumpectomy and sentinel node biopsy on 05/11/2021. She had 1 node with micromets and 2 negative nodes (3 total) removed. It is ER/PR negative and HER2 negative with a Ki67 of 40%.   PRECAUTIONS:  ongoing chemo   SUBJECTIVE: The breast seems about the same since I was here last. I am, for some reason, very tired this morning though. But I've also been able to do a lot more lately.   PAIN:  Are you having pain? No   OBJECTIVE:  UPPER EXTREMITY AROM/PROM:   A/PROM RIGHT   eval    Shoulder extension    Shoulder flexion 173  Shoulder abduction 177  Shoulder internal rotation    Shoulder external rotation                            (Blank rows = not tested)       CERVICAL AROM: All within normal limits:          UPPER EXTREMITY STRENGTH: WNL      LYMPHEDEMA ASSESSMENTS:  not tested as pt had SOZO screen yesterday and no lymphedema present        TODAY'S TREATMENT   10/31/21: Manual Therapy STM in supine to Rt axilla  MLD: In Supine: Short neck, superficial and deep abdominals, Lt axillary nodes, Lt intact upper quadrant,  then anterior inter-axillary anastomosis, next Rt inguinal nodes and Rt axillo-inguinal, then continued to focused on medial aspect of breast; also in Lt S/L for further work along Rt axillo-inguinal anastomosis and at incision where some mod edema present inferior to incision which is increasing the presence of her cording here today. MFR to Rt axilla in end Rt shoulder flexion  10/28/21: Manual Therapy STM in supine to Rt axilla and lateral trunk where palpable tightness present today,though improved since last session  MLD: In Supine: Short neck, superficial and deep abdominals, Lt axillary nodes, Lt intact upper quadrant, then anterior inter-axillary anastomosis, next Rt inguinal nodes and Rt  axillo-inguinal, then continued to focused on medial aspect of breast; also in Lt S/L for further work along Rt axillo-inguinal anastomosis and at incision where some mod edema present inferior to incision which is increasing the presence of her cording here today. MFR to Rt axilla in end Rt shoulder flexion  10/26/21: Manual Therapy STM in supine to Rt axilla and lateral trunk where palpable tightness present today,though improved since last session  MLD: In Supine: Short neck, superficial and deep abdominals, Lt axillary nodes, Lt intact upper quadrant, then anterior inter-axillary anastomosis, next Rt inguinal nodes and Rt axillo-inguinal, then continued to focused on medial aspect of breast; also in Lt S/L for further work along Rt axillo-inguinal anastomosis and at incision where some mild edema present inferior to incision MFR to Rt axilla in end Rt shoulder flexion and D2    PATIENT EDUCATION:  Education details: Self breast MLD Person educated: Patient Education method: Consulting civil engineer, Demonstration, and Tactile cues Education comprehension: verbalized understanding and returned demonstration     HOME EXERCISE PROGRAM: Add stretch as above ; self Rt breast MLD   ASSESSMENT:   CLINICAL IMPRESSION: Pt reports feeling like she was able to prevent her breast lymphedema from increasing over the weekend with self MLD and use of compression in her bra. Continued with MLD to her Rt breast focusing on lymphedema at medial aspect and inferior to her incision. Also MFR to mild cording superior to her incision.    OBJECTIVE IMPAIRMENTS increased fascial restrictions.      PARTICIPATION LIMITATIONS: ongoing chemo    PERSONAL FACTORS Past/current experiences are also affecting patient's functional outcome.  Pt has recently completed PT for treatment of similar symptoms    REHAB POTENTIAL: Good   CLINICAL DECISION MAKING: Stable/uncomplicated   EVALUATION COMPLEXITY: Low   GOALS: Goals  reviewed with patient? Yes   LONG TERM GOALS: Target date:  11/15/2021   Pt will report that symptoms of cording are decreased by 75% and that she knows how to manage them  Baseline: Goal status: PARTIALLY MET -09/15/21 40% better; pt did not rate today but reports though still present, this is much improved - 10/18/21   2.  Pt will have a compression sleeve to wear prophylactically for flying and at high elevations  Baseline:  Goal status: MET - 09/15/21 she has obtained a sleeve but will bring it in to be assessed; pt has received sleeve and wore this to fly recently - 10/18/21 3. Pt will demonstrate a 75% improvement in new swelling in Rt breast.    Baseline: New swelling noted with peau d'orange  Goal Status: NEW - 10/18/21   PLAN: PT FREQUENCY: 1-2x/week   PT DURATION: 4 weeks   PLANNED INTERVENTIONS: manual techniques, manual lymph drainage, patient  eduction, orthotic fit, therapeutic exercise    PLAN FOR NEXT SESSION:  Cont manual  techniques as indicated; self MLD improved since this session after review? How is new chip pack at medial breast?  Otelia Limes, PTA 10/31/2021, 12:39 PM

## 2021-11-01 ENCOUNTER — Ambulatory Visit: Payer: BC Managed Care – PPO

## 2021-11-01 ENCOUNTER — Ambulatory Visit
Admission: RE | Admit: 2021-11-01 | Discharge: 2021-11-01 | Disposition: A | Discharge status: Home / Self Care | Payer: BC Managed Care – PPO | Source: Ambulatory Visit | Attending: Radiation Oncology | Admitting: Radiation Oncology

## 2021-11-01 ENCOUNTER — Other Ambulatory Visit: Payer: Self-pay

## 2021-11-01 DIAGNOSIS — Z510 Encounter for antineoplastic radiation therapy: Secondary | ICD-10-CM | POA: Diagnosis not present

## 2021-11-01 LAB — RAD ONC ARIA SESSION SUMMARY

## 2021-11-02 ENCOUNTER — Ambulatory Visit: Payer: BC Managed Care – PPO

## 2021-11-02 ENCOUNTER — Ambulatory Visit
Admission: RE | Admit: 2021-11-02 | Discharge: 2021-11-02 | Disposition: A | Discharge status: Home / Self Care | Payer: BC Managed Care – PPO | Source: Ambulatory Visit | Attending: Radiation Oncology | Admitting: Radiation Oncology

## 2021-11-02 ENCOUNTER — Other Ambulatory Visit: Payer: Self-pay

## 2021-11-02 DIAGNOSIS — Z510 Encounter for antineoplastic radiation therapy: Secondary | ICD-10-CM | POA: Diagnosis not present

## 2021-11-02 LAB — RAD ONC ARIA SESSION SUMMARY
Course Elapsed Days: 22
Plan Fractions Treated to Date: 16
Plan Fractions Treated to Date: 16
Plan Prescribed Dose Per Fraction: 1.8 Gy
Plan Prescribed Dose Per Fraction: 1.8 Gy
Plan Total Fractions Prescribed: 28
Plan Total Fractions Prescribed: 28
Plan Total Prescribed Dose: 50.4 Gy
Plan Total Prescribed Dose: 50.4 Gy
Reference Point Dosage Given to Date: 28.8 Gy
Reference Point Dosage Given to Date: 28.8 Gy
Reference Point Session Dosage Given: 1.8 Gy
Reference Point Session Dosage Given: 1.8 Gy
Session Number: 16

## 2021-11-03 ENCOUNTER — Other Ambulatory Visit: Payer: Self-pay

## 2021-11-03 ENCOUNTER — Ambulatory Visit
Admission: RE | Admit: 2021-11-03 | Discharge: 2021-11-03 | Disposition: A | Discharge status: Home / Self Care | Payer: BC Managed Care – PPO | Source: Ambulatory Visit | Attending: Radiation Oncology | Admitting: Radiation Oncology

## 2021-11-03 ENCOUNTER — Ambulatory Visit: Payer: BC Managed Care – PPO

## 2021-11-03 DIAGNOSIS — R293 Abnormal posture: Secondary | ICD-10-CM

## 2021-11-03 DIAGNOSIS — C50411 Malignant neoplasm of upper-outer quadrant of right female breast: Secondary | ICD-10-CM

## 2021-11-03 DIAGNOSIS — Z510 Encounter for antineoplastic radiation therapy: Secondary | ICD-10-CM | POA: Diagnosis not present

## 2021-11-03 DIAGNOSIS — Z483 Aftercare following surgery for neoplasm: Secondary | ICD-10-CM

## 2021-11-03 LAB — RAD ONC ARIA SESSION SUMMARY
Course Elapsed Days: 23
Plan Fractions Treated to Date: 17
Plan Fractions Treated to Date: 17
Plan Prescribed Dose Per Fraction: 1.8 Gy
Plan Prescribed Dose Per Fraction: 1.8 Gy
Plan Total Fractions Prescribed: 28
Plan Total Fractions Prescribed: 28
Plan Total Prescribed Dose: 50.4 Gy
Plan Total Prescribed Dose: 50.4 Gy
Reference Point Dosage Given to Date: 30.6 Gy
Reference Point Dosage Given to Date: 30.6 Gy
Reference Point Session Dosage Given: 1.8 Gy
Reference Point Session Dosage Given: 1.8 Gy
Session Number: 17

## 2021-11-03 NOTE — Therapy (Signed)
OUTPATIENT PHYSICAL THERAPY TREATMENT NOTE   Patient Name: Natasha Ortega MRN: [id number] DOB:01-Jun-1970, 51 y.o., female Today's Date: 11/03/2021  PCP: Louretta Shorten  REFERRING PROVIDER: Nicholas Lose, MD  END OF SESSION:   PT End of Session - 11/03/21 1020     Visit Number 15    Number of Visits 21    Date for PT Re-Evaluation 11/15/21    PT Start Time 1013    PT Stop Time 1102    PT Time Calculation (min) 49 min    Activity Tolerance Patient tolerated treatment well    Behavior During Therapy [facility] for tasks assessed/performed             Past Medical History:  Diagnosis Date   Cancer ([place]) 04/21/2021   right breast IDC   Headache    migraines   Past Surgical History:  Procedure Laterality Date   BREAST LUMPECTOMY WITH RADIOACTIVE SEED AND SENTINEL LYMPH NODE BIOPSY Right 05/11/2021   Procedure: RIGHT BREAST LUMPECTOMY WITH RADIOACTIVE SEED AND SENTINEL LYMPH NODE BIOPSY;  Surgeon: [name], MD;  Location: [place];  Service: General;  Laterality: Right;   PORT-A-CATH REMOVAL Left 09/13/2021   Procedure: REMOVAL PORT-A-CATH;  Surgeon: [name], MD;  Location: [place];  Service: General;  Laterality: Left;   PORTACATH PLACEMENT N/A 06/09/2021   Procedure: INSERTION PORT-A-CATH;  Surgeon: [name], MD;  Location: [place];  Service: General;  Laterality: N/A;   Patient Active Problem List   Diagnosis Date Noted   Port-A-Cath in place 06/21/2021   Genetic testing 05/09/2021   Family history of breast cancer 05/05/2021   Malignant neoplasm of upper-outer quadrant of right breast in female, estrogen receptor negative ([place]) 04/28/2021    REFERRING DIAG: malignant neoplasm of right breast   THERAPY DIAG:  Aftercare following surgery for neoplasm  Abnormal posture  Malignant neoplasm of upper-outer quadrant of right breast in female, estrogen receptor negative ([place])  Rationale for Evaluation and Treatment  Rehabilitation  PERTINENT HISTORY: Patient was diagnosed on 04/28/2021 with right grade 2 invasive ductal carcinoma breast cancer. She underwent a right lumpectomy and sentinel node biopsy on 05/11/2021. She had 1 node with micromets and 2 negative nodes (3 total) removed. It is ER/PR negative and HER2 negative with a Ki67 of 40%.   PRECAUTIONS:  ongoing chemo   SUBJECTIVE: I think the peau d'orange is getting a bit bigger from the radiation and I feel a little more tender there as well. I've been using the chip pack there.  PAIN:  Are you having pain? No   OBJECTIVE:  UPPER EXTREMITY AROM/PROM:   A/PROM RIGHT   eval    Shoulder extension    Shoulder flexion 173  Shoulder abduction 177  Shoulder internal rotation    Shoulder external rotation                            (Blank rows = not tested)       CERVICAL AROM: All within normal limits:          UPPER EXTREMITY STRENGTH: WNL      LYMPHEDEMA ASSESSMENTS:  not tested as pt had SOZO screen yesterday and no lymphedema present        TODAY'S TREATMENT  11/03/21: Manual Therapy  MLD: In Supine: Short neck, superficial and deep abdominals, Lt axillary nodes, Lt intact upper quadrant, then anterior inter-axillary anastomosis, next Rt inguinal nodes and Rt  axillo-inguinal, then continued to focused on medial aspect of breast and finished retracing steps ending with LNs.   10/31/21: Manual Therapy STM in supine to Rt axilla  MLD: In Supine: Short neck, superficial and deep abdominals, Lt axillary nodes, Lt intact upper quadrant, then anterior inter-axillary anastomosis, next Rt inguinal nodes and Rt axillo-inguinal, then continued to focused on medial aspect of breast; also in Lt S/L for further work along Rt axillo-inguinal anastomosis and at incision where some mod edema present inferior to incision which is increasing the presence of her cording here today. MFR to Rt axilla in end Rt shoulder flexion  10/28/21: Manual  Therapy STM in supine to Rt axilla and lateral trunk where palpable tightness present today,though improved since last session  MLD: In Supine: Short neck, superficial and deep abdominals, Lt axillary nodes, Lt intact upper quadrant, then anterior inter-axillary anastomosis, next Rt inguinal nodes and Rt axillo-inguinal, then continued to focused on medial aspect of breast; also in Lt S/L for further work along Rt axillo-inguinal anastomosis and at incision where some mod edema present inferior to incision which is increasing the presence of her cording here today. MFR to Rt axilla in end Rt shoulder flexion  10/26/21: Manual Therapy STM in supine to Rt axilla and lateral trunk where palpable tightness present today,though improved since last session  MLD: In Supine: Short neck, superficial and deep abdominals, Lt axillary nodes, Lt intact upper quadrant, then anterior inter-axillary anastomosis, next Rt inguinal nodes and Rt axillo-inguinal, then continued to focused on medial aspect of breast; also in Lt S/L for further work along Rt axillo-inguinal anastomosis and at incision where some mild edema present inferior to incision MFR to Rt axilla in end Rt shoulder flexion and D2    PATIENT EDUCATION:  Education details: Self breast MLD Person educated: Patient Education method: Consulting civil engineer, Demonstration, and Tactile cues Education comprehension: verbalized understanding and returned demonstration     HOME EXERCISE PROGRAM: Add stretch as above ; self Rt breast MLD   ASSESSMENT:   CLINICAL IMPRESSION: Some increased peau d'orange noted at medial aspect of Rt breast today so focused MLD here. Educated pt that her fluid can shift with wear of chip pack so mve this as needed in compression bra as fluid moves to "chase the fluid".   Pt verbalized good understanding.    OBJECTIVE IMPAIRMENTS increased fascial restrictions.      PARTICIPATION LIMITATIONS: ongoing chemo    PERSONAL FACTORS  Past/current experiences are also affecting patient's functional outcome.  Pt has recently completed PT for treatment of similar symptoms    REHAB POTENTIAL: Good   CLINICAL DECISION MAKING: Stable/uncomplicated   EVALUATION COMPLEXITY: Low   GOALS: Goals reviewed with patient? Yes   LONG TERM GOALS: Target date:  11/15/2021   Pt will report that symptoms of cording are decreased by 75% and that she knows how to manage them  Baseline: Goal status: PARTIALLY MET -09/15/21 40% better; pt did not rate today but reports though still present, this is much improved - 10/18/21   2.  Pt will have a compression sleeve to wear prophylactically for flying and at high elevations  Baseline:  Goal status: MET - 09/15/21 she has obtained a sleeve but will bring it in to be assessed; pt has received sleeve and wore this to fly recently - 10/18/21 3. Pt will demonstrate a 75% improvement in new swelling in Rt breast.    Baseline: New swelling noted with peau d'orange  Goal Status: NEW -  10/18/21   PLAN: PT FREQUENCY: 1-2x/week   PT DURATION: 4 weeks   PLANNED INTERVENTIONS: manual techniques, manual lymph drainage, patient  eduction, orthotic fit, therapeutic exercise    PLAN FOR NEXT SESSION:  Cont manual techniques as indicated; self MLD improved since this session after review? How is new chip pack at medial breast?   Otelia Limes, PTA 11/03/2021, 11:07 AM

## 2021-11-04 ENCOUNTER — Ambulatory Visit
Admission: RE | Admit: 2021-11-04 | Discharge: 2021-11-04 | Disposition: A | Discharge status: Home / Self Care | Payer: BC Managed Care – PPO | Source: Ambulatory Visit | Attending: Radiation Oncology | Admitting: Radiation Oncology

## 2021-11-04 ENCOUNTER — Other Ambulatory Visit: Payer: Self-pay

## 2021-11-04 ENCOUNTER — Ambulatory Visit: Payer: BC Managed Care – PPO

## 2021-11-04 DIAGNOSIS — Z510 Encounter for antineoplastic radiation therapy: Secondary | ICD-10-CM | POA: Diagnosis not present

## 2021-11-04 DIAGNOSIS — Z171 Estrogen receptor negative status [ER-]: Secondary | ICD-10-CM

## 2021-11-04 LAB — RAD ONC ARIA SESSION SUMMARY
Course Elapsed Days: 24
Plan Fractions Treated to Date: 18
Plan Fractions Treated to Date: 18
Plan Prescribed Dose Per Fraction: 1.8 Gy
Plan Prescribed Dose Per Fraction: 1.8 Gy
Plan Total Fractions Prescribed: 28
Plan Total Fractions Prescribed: 28
Plan Total Prescribed Dose: 50.4 Gy
Plan Total Prescribed Dose: 50.4 Gy
Reference Point Dosage Given to Date: 32.4 Gy
Reference Point Dosage Given to Date: 32.4 Gy
Reference Point Session Dosage Given: 1.8 Gy
Reference Point Session Dosage Given: 1.8 Gy
Session Number: 18

## 2021-11-04 MED ORDER — RADIAPLEXRX EX GEL: Freq: Once | CUTANEOUS | Status: AC

## 2021-11-07 ENCOUNTER — Ambulatory Visit: Payer: BC Managed Care – PPO

## 2021-11-07 ENCOUNTER — Other Ambulatory Visit: Payer: Self-pay

## 2021-11-07 ENCOUNTER — Inpatient Hospital Stay: Payer: BC Managed Care – PPO | Attending: Hematology and Oncology | Admitting: Hematology and Oncology

## 2021-11-07 ENCOUNTER — Ambulatory Visit
Admission: RE | Admit: 2021-11-07 | Discharge: 2021-11-07 | Disposition: A | Discharge status: Home / Self Care | Payer: BC Managed Care – PPO | Source: Ambulatory Visit | Attending: Radiation Oncology | Admitting: Radiation Oncology

## 2021-11-07 DIAGNOSIS — Z171 Estrogen receptor negative status [ER-]: Secondary | ICD-10-CM | POA: Diagnosis not present

## 2021-11-07 DIAGNOSIS — C50411 Malignant neoplasm of upper-outer quadrant of right female breast: Secondary | ICD-10-CM

## 2021-11-07 DIAGNOSIS — Z510 Encounter for antineoplastic radiation therapy: Secondary | ICD-10-CM | POA: Diagnosis not present

## 2021-11-07 LAB — RAD ONC ARIA SESSION SUMMARY
Course Elapsed Days: 27
Plan Fractions Treated to Date: 19
Plan Fractions Treated to Date: 19
Plan Prescribed Dose Per Fraction: 1.8 Gy
Plan Prescribed Dose Per Fraction: 1.8 Gy
Plan Total Fractions Prescribed: 28
Plan Total Fractions Prescribed: 28
Plan Total Prescribed Dose: 50.4 Gy
Plan Total Prescribed Dose: 50.4 Gy
Reference Point Dosage Given to Date: 34.2 Gy
Reference Point Dosage Given to Date: 34.2 Gy
Reference Point Session Dosage Given: 1.8 Gy
Reference Point Session Dosage Given: 1.8 Gy
Session Number: 19

## 2021-11-07 NOTE — Assessment & Plan Note (Addendum)
04/21/2021:Screening mammogram detected right breast mass 6 mm by ultrasound, axilla negative, biopsy: Grade 2 IDC ER 0%, PR 0%, HER2 2+ by IHC 05/11/2021:Right lumpectomy: Grade 2 IDC 0.9 cm, margins negative, angiolymphatic invasion present, 1 lymph node with micrometastases, 0/2 other lymph nodes negative, ER 0%, PR 0%, HER22+ by IHC and FISH negative with a ratio 1.33Ki-67 25%  Treatment plan: 1.Adjuvant chemotherapy withTaxotere Cytoxan every 3 weeks x4 completed 08/24/2021 2.Adjuvant radiation to be completed 11/25/2021 ------------------------------------------------------------------------------------------------------------------- With the completion of radiation she will conclude her entire treatment.  Chemo-induced complications: Memory loss: I discussed with her work importance of exercise.  Return to clinic in 3 months for survivorship care plan visit

## 2021-11-07 NOTE — Progress Notes (Signed)
Patient Care Team: Louretta Shorten, MD as PCP - General (Obstetrics and Gynecology) Nicholas Lose, MD as Consulting Physician (Hematology and Oncology) Kyung Rudd, MD as Consulting Physician (Radiation Oncology) [name], MD as Consulting Physician (General Surgery)  DIAGNOSIS:  Encounter Diagnosis  Name Primary?   Malignant neoplasm of upper-outer quadrant of right breast in female, estrogen receptor negative ([place])     SUMMARY OF ONCOLOGIC HISTORY: Oncology History  Malignant neoplasm of upper-outer quadrant of right breast in female, estrogen receptor negative ([place])  04/21/2021 Initial Diagnosis   Screening mammogram detected right breast mass 6 mm by ultrasound, axilla negative, biopsy: Grade 2 IDC ER 0%, PR 0%, HER2 2+ by [facility]   05/11/2021 Surgery   Right lumpectomy: Grade 2 IDC 0.9 cm, margins negative, angiolymphatic invasion present, 1 lymph node with micrometastases, 0/2 other lymph nodes negative, ER 0%, PR 0%, HER2 negative by FISH Ki-67 25% (final pathology is negative for HER2)   05/11/2021 Cancer Staging   Staging form: Breast, AJCC 8th Edition - Pathologic stage from 05/11/2021: Stage IB (pT1b, pN15mi, cM0, G2, ER-, PR-, HER2-) - Signed by Gardenia Phlegm, NP on 08/03/2021 Stage prefix: Initial diagnosis Histologic grading system: 3 grade system   06/22/2021 - 08/26/2021 Chemotherapy   Patient is on Treatment Plan : BREAST TC q21d      Genetic Testing   [facility] CustomNext Panel was Negative. Report date is 07/05/2021.  The CustomNext-Cancer+RNAinsight panel offered by [facility] includes sequencing and rearrangement analysis for the following 47 genes:  APC, ATM, AXIN2, BARD1, BMPR1A, BRCA1, BRCA2, BRIP1, CDH1, CDK4, CDKN2A, CHEK2, CTNNA1, DICER1, EPCAM, GREM1, HOXB13, KIT, MEN1, MLH1, MSH2, MSH3, MSH6, MUTYH, NBN, NF1, NTHL1, PALB2, PDGFRA, PMS2, POLD1, POLE, PTEN, RAD50, RAD51C, RAD51D, SDHA, SDHB, SDHC, SDHD, SMAD4, SMARCA4, STK11, TP53, TSC1, TSC2, and VHL.   RNA data is routinely analyzed for use in variant interpretation for all genes.   10/12/2021 - 11/25/2021 Radiation Therapy   Adjuvant radiation     CHIEF COMPLIANT: Follow-up on radiation    INTERVAL HISTORY: Natasha Ortega is a 51 y.o. female is here because of recent diagnosis of right-sided breast cancer on Docetaxel  Cyclophosphamide She presents to the clinic today for a follow-up. She states radiation is going well. She reports no issues or concerns. Denies neuropathy. She does have chemo brain. She has some concerns on when does it get better.   ALLERGIES:  is allergic to penicillins.  MEDICATIONS:  Current Outpatient Medications  Medication Sig Dispense Refill   ibuprofen (ADVIL) 200 MG tablet Take by mouth.     No current facility-administered medications for this visit.    PHYSICAL EXAMINATION: ECOG PERFORMANCE STATUS: 1 - Symptomatic but completely ambulatory  Vitals:   11/07/21 0922  BP: 109/88  Pulse: 94  Resp: 18  Temp: (!) 97.2 F (36.2 C)  SpO2: 97%   Filed Weights   11/07/21 0922  Weight: 192 lb 4.8 oz (87.2 kg)      LABORATORY DATA:  I have reviewed the data as listed    Latest Ref Rng & Units 08/24/2021   10:34 AM 08/03/2021   10:44 AM 07/13/2021    1:30 PM  CMP  Glucose 70 - 99 mg/dL 120  144  103   BUN 6 - 20 mg/dL $Remove'13  13  17   'ldgVodh$ Creatinine 0.44 - 1.00 mg/dL 0.88  0.75  1.10   Sodium 135 - 145 mmol/L 137  137  139   Potassium 3.5 - 5.1 mmol/L  3.9  4.1  3.7   Chloride 98 - 111 mmol/L 106  104  106   CO2 22 - 32 mmol/L $RemoveB'23  25  25   'FMburZWG$ Calcium 8.9 - 10.3 mg/dL 9.0  9.8  9.7   Total Protein 6.5 - 8.1 g/dL 6.5  7.1  7.3   Total Bilirubin 0.3 - 1.2 mg/dL 0.3  0.3  0.6   Alkaline Phos 38 - 126 U/L 47  54  50   AST 15 - 41 U/L $Remo'16  20  24   'aZAKL$ ALT 0 - 44 U/L 28  37  40     Lab Results  Component Value Date   WBC 11.6 (H) 08/24/2021   HGB 11.7 (L) 08/24/2021   HCT 34.9 (L) 08/24/2021   MCV 92.3 08/24/2021   PLT 329 08/24/2021   NEUTROABS 8.3 (H)  08/24/2021    ASSESSMENT & PLAN:  Malignant neoplasm of upper-outer quadrant of right breast in female, estrogen receptor negative ([place]) 04/21/2021:Screening mammogram detected right breast mass 6 mm by ultrasound, axilla negative, biopsy: Grade 2 IDC ER 0%, PR 0%, HER2 2+ by IHC 05/11/2021:Right lumpectomy: Grade 2 IDC 0.9 cm, margins negative, angiolymphatic invasion present, 1 lymph node with micrometastases, 0/2 other lymph nodes negative, ER 0%, PR 0%, HER2 2+ by IHC and FISH negative with a ratio 1.33 Ki-67 25%   Treatment plan: 1.  Adjuvant chemotherapy with Taxotere Cytoxan every 3 weeks x4 completed 08/24/2021 2.  Adjuvant radiation to be completed 11/25/2021 ------------------------------------------------------------------------------------------------------------------- With the completion of radiation she will conclude her entire treatment.  Chemo-induced complications: Memory loss: I discussed with her work importance of exercise.  Return to clinic in 3 months for survivorship care plan visit     No orders of the defined types were placed in this encounter.  The patient has a good understanding of the overall plan. she agrees with it. she will call with any problems that may develop before the next visit here. Total time spent: 30 mins including face to face time and time spent for planning, charting and co-ordination of care   Harriette Ohara, MD 11/07/21    I Gardiner Coins am scribing for Dr. Lindi Adie  I have reviewed the above documentation for accuracy and completeness, and I agree with the above.

## 2021-11-08 ENCOUNTER — Other Ambulatory Visit: Payer: Self-pay

## 2021-11-08 ENCOUNTER — Ambulatory Visit
Admission: RE | Admit: 2021-11-08 | Discharge: 2021-11-08 | Disposition: A | Discharge status: Home / Self Care | Payer: BC Managed Care – PPO | Source: Ambulatory Visit | Attending: Radiation Oncology | Admitting: Radiation Oncology

## 2021-11-08 ENCOUNTER — Ambulatory Visit: Payer: BC Managed Care – PPO

## 2021-11-08 DIAGNOSIS — C50411 Malignant neoplasm of upper-outer quadrant of right female breast: Secondary | ICD-10-CM

## 2021-11-08 DIAGNOSIS — Z483 Aftercare following surgery for neoplasm: Secondary | ICD-10-CM | POA: Diagnosis not present

## 2021-11-08 DIAGNOSIS — Z510 Encounter for antineoplastic radiation therapy: Secondary | ICD-10-CM | POA: Diagnosis not present

## 2021-11-08 DIAGNOSIS — R293 Abnormal posture: Secondary | ICD-10-CM

## 2021-11-08 LAB — RAD ONC ARIA SESSION SUMMARY
Course Elapsed Days: 28
Plan Fractions Treated to Date: 20
Plan Fractions Treated to Date: 20
Plan Prescribed Dose Per Fraction: 1.8 Gy
Plan Prescribed Dose Per Fraction: 1.8 Gy
Plan Total Fractions Prescribed: 28
Plan Total Fractions Prescribed: 28
Plan Total Prescribed Dose: 50.4 Gy
Plan Total Prescribed Dose: 50.4 Gy
Reference Point Dosage Given to Date: 36 Gy
Reference Point Dosage Given to Date: 36 Gy
Reference Point Session Dosage Given: 1.8 Gy
Reference Point Session Dosage Given: 1.8 Gy
Session Number: 20

## 2021-11-08 NOTE — Therapy (Signed)
OUTPATIENT PHYSICAL THERAPY TREATMENT NOTE   Patient Name: Natasha Ortega MRN: [id number] DOB:December 09, 1970, 51 y.o., female Today's Date: 11/08/2021  PCP: Louretta Shorten  REFERRING PROVIDER: Nicholas Lose, MD  END OF SESSION:   PT End of Session - 11/08/21 1010     Visit Number 16    Number of Visits 21    Date for PT Re-Evaluation 11/15/21    PT Start Time 1008    PT Stop Time 1102    PT Time Calculation (min) 54 min    Activity Tolerance Patient tolerated treatment well    Behavior During Therapy [facility] for tasks assessed/performed             Past Medical History:  Diagnosis Date   Cancer ([place]) 04/21/2021   right breast IDC   Headache    migraines   Past Surgical History:  Procedure Laterality Date   BREAST LUMPECTOMY WITH RADIOACTIVE SEED AND SENTINEL LYMPH NODE BIOPSY Right 05/11/2021   Procedure: RIGHT BREAST LUMPECTOMY WITH RADIOACTIVE SEED AND SENTINEL LYMPH NODE BIOPSY;  Surgeon: [name], MD;  Location: [place];  Service: General;  Laterality: Right;   PORT-A-CATH REMOVAL Left 09/13/2021   Procedure: REMOVAL PORT-A-CATH;  Surgeon: [name], MD;  Location: [place];  Service: General;  Laterality: Left;   PORTACATH PLACEMENT N/A 06/09/2021   Procedure: INSERTION PORT-A-CATH;  Surgeon: [name], MD;  Location: [place];  Service: General;  Laterality: N/A;   Patient Active Problem List   Diagnosis Date Noted   Port-A-Cath in place 06/21/2021   Genetic testing 05/09/2021   Family history of breast cancer 05/05/2021   Malignant neoplasm of upper-outer quadrant of right breast in female, estrogen receptor negative ([place]) 04/28/2021    REFERRING DIAG: malignant neoplasm of right breast   THERAPY DIAG:  Aftercare following surgery for neoplasm  Abnormal posture  Malignant neoplasm of upper-outer quadrant of right breast in female, estrogen receptor negative ([place])  Rationale for Evaluation and Treatment  Rehabilitation  PERTINENT HISTORY: Patient was diagnosed on 04/28/2021 with right grade 2 invasive ductal carcinoma breast cancer. She underwent a right lumpectomy and sentinel node biopsy on 05/11/2021. She had 1 node with micromets and 2 negative nodes (3 total) removed. It is ER/PR negative and HER2 negative with a Ki67 of 40%.   PRECAUTIONS:  ongoing chemo   SUBJECTIVE: I have been so tired lately. And my sternum is getting itchy.   PAIN:  Are you having pain? No   OBJECTIVE:  UPPER EXTREMITY AROM/PROM:   A/PROM RIGHT   eval    Shoulder extension    Shoulder flexion 173  Shoulder abduction 177  Shoulder internal rotation    Shoulder external rotation                            (Blank rows = not tested)       CERVICAL AROM: All within normal limits:          UPPER EXTREMITY STRENGTH: WNL      LYMPHEDEMA ASSESSMENTS:  not tested as pt had SOZO screen yesterday and no lymphedema present        TODAY'S TREATMENT  11/08/21: Manual Therapy  MLD: In Supine: Short neck, superficial and deep abdominals, Lt axillary nodes, Lt intact upper quadrant, then anterior inter-axillary anastomosis, next Rt inguinal nodes and Rt axillo-inguinal, then continued to focused on medial aspect of breast and finished retracing steps ending with LNs.  11/03/21: Manual Therapy  MLD: In Supine: Short neck, superficial and deep abdominals, Lt axillary nodes, Lt intact upper quadrant, then anterior inter-axillary anastomosis, next Rt inguinal nodes and Rt axillo-inguinal, then continued to focused on medial aspect of breast and finished retracing steps ending with LNs.   10/31/21: Manual Therapy STM in supine to Rt axilla  MLD: In Supine: Short neck, superficial and deep abdominals, Lt axillary nodes, Lt intact upper quadrant, then anterior inter-axillary anastomosis, next Rt inguinal nodes and Rt axillo-inguinal, then continued to focused on medial aspect of breast; also in Lt S/L for further  work along Rt axillo-inguinal anastomosis and at incision where some mod edema present inferior to incision which is increasing the presence of her cording here today. MFR to Rt axilla in end Rt shoulder flexion     PATIENT EDUCATION:  Education details: Self breast MLD Person educated: Patient Education method: Explanation, Demonstration, and Tactile cues Education comprehension: verbalized understanding and returned demonstration     HOME EXERCISE PROGRAM: Add stretch as above ; self Rt breast MLD   ASSESSMENT:   CLINICAL IMPRESSION: Continued with manual therapy working to decrease lymphedema as able while pt is undergoing radiation. Increased peau d'orange noted at inferior aspect of breast but no increased fibrosis. Pt is doing well managing symptoms thus far but will continue to need assistance with this as she continues to receive daily radiation which can cont to cause increased edema and skin fragility.    OBJECTIVE IMPAIRMENTS increased fascial restrictions.      PARTICIPATION LIMITATIONS: ongoing chemo    PERSONAL FACTORS Past/current experiences are also affecting patient's functional outcome.  Pt has recently completed PT for treatment of similar symptoms    REHAB POTENTIAL: Good   CLINICAL DECISION MAKING: Stable/uncomplicated   EVALUATION COMPLEXITY: Low   GOALS: Goals reviewed with patient? Yes   LONG TERM GOALS: Target date:  11/15/2021   Pt will report that symptoms of cording are decreased by 75% and that she knows how to manage them  Baseline: Goal status: PARTIALLY MET -09/15/21 40% better; pt did not rate today but reports though still present, this is much improved - 10/18/21   2.  Pt will have a compression sleeve to wear prophylactically for flying and at high elevations  Baseline:  Goal status: MET - 09/15/21 she has obtained a sleeve but will bring it in to be assessed; pt has received sleeve and wore this to fly recently - 10/18/21 3. Pt will  demonstrate a 75% improvement in new swelling in Rt breast.    Baseline: New swelling noted with peau d'orange  Goal Status: NEW - 10/18/21   PLAN: PT FREQUENCY: 1-2x/week   PT DURATION: 4 weeks   PLANNED INTERVENTIONS: manual techniques, manual lymph drainage, patient  eduction, orthotic fit, therapeutic exercise    PLAN FOR NEXT SESSION:  Cont manual techniques as indicated   Otelia Limes, PTA 11/08/2021, 11:14 AM

## 2021-11-09 ENCOUNTER — Other Ambulatory Visit: Payer: Self-pay

## 2021-11-09 ENCOUNTER — Ambulatory Visit
Admission: RE | Admit: 2021-11-09 | Discharge: 2021-11-09 | Disposition: A | Discharge status: Home / Self Care | Payer: BC Managed Care – PPO | Source: Ambulatory Visit | Attending: Radiation Oncology | Admitting: Radiation Oncology

## 2021-11-09 DIAGNOSIS — Z510 Encounter for antineoplastic radiation therapy: Secondary | ICD-10-CM | POA: Diagnosis not present

## 2021-11-09 LAB — RAD ONC ARIA SESSION SUMMARY
Course Elapsed Days: 29
Plan Fractions Treated to Date: 21
Plan Fractions Treated to Date: 21
Plan Prescribed Dose Per Fraction: 1.8 Gy
Plan Prescribed Dose Per Fraction: 1.8 Gy
Plan Total Fractions Prescribed: 28
Plan Total Fractions Prescribed: 28
Plan Total Prescribed Dose: 50.4 Gy
Plan Total Prescribed Dose: 50.4 Gy
Reference Point Dosage Given to Date: 37.8 Gy
Reference Point Dosage Given to Date: 37.8 Gy
Reference Point Session Dosage Given: 1.8 Gy
Reference Point Session Dosage Given: 1.8 Gy
Session Number: 21

## 2021-11-10 ENCOUNTER — Ambulatory Visit: Payer: BC Managed Care – PPO

## 2021-11-10 ENCOUNTER — Ambulatory Visit
Admission: RE | Admit: 2021-11-10 | Discharge: 2021-11-10 | Disposition: A | Discharge status: Home / Self Care | Payer: BC Managed Care – PPO | Source: Ambulatory Visit | Attending: Radiation Oncology | Admitting: Radiation Oncology

## 2021-11-10 ENCOUNTER — Other Ambulatory Visit: Payer: Self-pay

## 2021-11-10 DIAGNOSIS — R293 Abnormal posture: Secondary | ICD-10-CM

## 2021-11-10 DIAGNOSIS — C50411 Malignant neoplasm of upper-outer quadrant of right female breast: Secondary | ICD-10-CM

## 2021-11-10 DIAGNOSIS — Z510 Encounter for antineoplastic radiation therapy: Secondary | ICD-10-CM | POA: Diagnosis not present

## 2021-11-10 DIAGNOSIS — Z483 Aftercare following surgery for neoplasm: Secondary | ICD-10-CM

## 2021-11-10 LAB — RAD ONC ARIA SESSION SUMMARY
Course Elapsed Days: 30
Plan Fractions Treated to Date: 22
Plan Fractions Treated to Date: 22
Plan Prescribed Dose Per Fraction: 1.8 Gy
Plan Prescribed Dose Per Fraction: 1.8 Gy
Plan Total Fractions Prescribed: 28
Plan Total Fractions Prescribed: 28
Plan Total Prescribed Dose: 50.4 Gy
Plan Total Prescribed Dose: 50.4 Gy
Reference Point Dosage Given to Date: 39.6 Gy
Reference Point Dosage Given to Date: 39.6 Gy
Reference Point Session Dosage Given: 1.8 Gy
Reference Point Session Dosage Given: 1.8 Gy
Session Number: 22

## 2021-11-10 NOTE — Therapy (Signed)
OUTPATIENT PHYSICAL THERAPY TREATMENT NOTE   Patient Name: Natasha Ortega MRN: [id number] DOB:05-Mar-1970, 51 y.o., female Today's Date: 11/10/2021  PCP: Louretta Shorten  REFERRING PROVIDER: Nicholas Lose, MD  END OF SESSION:   PT End of Session - 11/10/21 1011     Visit Number 17    Number of Visits 21    Date for PT Re-Evaluation 11/15/21    PT Start Time 1005    PT Stop Time 1108    PT Time Calculation (min) 63 min    Activity Tolerance Patient tolerated treatment well    Behavior During Therapy [facility] for tasks assessed/performed             Past Medical History:  Diagnosis Date   Cancer ([place]) 04/21/2021   right breast IDC   Headache    migraines   Past Surgical History:  Procedure Laterality Date   BREAST LUMPECTOMY WITH RADIOACTIVE SEED AND SENTINEL LYMPH NODE BIOPSY Right 05/11/2021   Procedure: RIGHT BREAST LUMPECTOMY WITH RADIOACTIVE SEED AND SENTINEL LYMPH NODE BIOPSY;  Surgeon: [name], MD;  Location: [place];  Service: General;  Laterality: Right;   PORT-A-CATH REMOVAL Left 09/13/2021   Procedure: REMOVAL PORT-A-CATH;  Surgeon: [name], MD;  Location: [place];  Service: General;  Laterality: Left;   PORTACATH PLACEMENT N/A 06/09/2021   Procedure: INSERTION PORT-A-CATH;  Surgeon: [name], MD;  Location: [place];  Service: General;  Laterality: N/A;   Patient Active Problem List   Diagnosis Date Noted   Port-A-Cath in place 06/21/2021   Genetic testing 05/09/2021   Family history of breast cancer 05/05/2021   Malignant neoplasm of upper-outer quadrant of right breast in female, estrogen receptor negative ([place]) 04/28/2021    REFERRING DIAG: malignant neoplasm of right breast   THERAPY DIAG:  Aftercare following surgery for neoplasm  Abnormal posture  Malignant neoplasm of upper-outer quadrant of right breast in female, estrogen receptor negative ([place])  Rationale for Evaluation and Treatment  Rehabilitation  PERTINENT HISTORY: Patient was diagnosed on 04/28/2021 with right grade 2 invasive ductal carcinoma breast cancer. She underwent a right lumpectomy and sentinel node biopsy on 05/11/2021. She had 1 node with micromets and 2 negative nodes (3 total) removed. It is ER/PR negative and HER2 negative with a Ki67 of 40%.   PRECAUTIONS:  ongoing chemo   SUBJECTIVE: My sternum is really bothering me. It just feels so tender.   PAIN:  Are you having pain? No   OBJECTIVE:  UPPER EXTREMITY AROM/PROM:   A/PROM RIGHT   eval    Shoulder extension    Shoulder flexion 173  Shoulder abduction 177  Shoulder internal rotation    Shoulder external rotation                            (Blank rows = not tested)       CERVICAL AROM: All within normal limits:          UPPER EXTREMITY STRENGTH: WNL      LYMPHEDEMA ASSESSMENTS:  not tested as pt had SOZO screen yesterday and no lymphedema present        TODAY'S TREATMENT  11/10/21: Manual Therapy  MLD: In Supine: Short neck, superficial and deep abdominals, Lt axillary nodes, Lt intact upper quadrant, anterior inter-axillary anastomosis, next Rt inguinal nodes and Rt axillo-inguinal, then continued to focused on medial aspect of breast, also focused on area inferior to incision when in  Lt S/L and focused on Rt axillo-inguinal anastomosis, then finished retracing steps in supine  ending with LNs. MFR to scar tissue at lumpectomy incision   11/08/21: Manual Therapy  MLD: In Supine: Short neck, superficial and deep abdominals, Lt axillary nodes, Lt intact upper quadrant, then anterior inter-axillary anastomosis, next Rt inguinal nodes and Rt axillo-inguinal, then continued to focused on medial aspect of breast and finished retracing steps ending with LNs.   11/03/21: Manual Therapy  MLD: In Supine: Short neck, superficial and deep abdominals, Lt axillary nodes, Lt intact upper quadrant, then anterior inter-axillary anastomosis, next  Rt inguinal nodes and Rt axillo-inguinal, then continued to focused on medial aspect of breast and finished retracing steps ending with LNs.      PATIENT EDUCATION:  Education details: Self breast MLD Person educated: Patient Education method: Explanation, Demonstration, and Tactile cues Education comprehension: verbalized understanding and returned demonstration     HOME EXERCISE PROGRAM: Add stretch as above ; self Rt breast MLD   ASSESSMENT:   CLINICAL IMPRESSION: Pt with increased peau d'orange noted at inferior aspect of breast and some increased fibrosis inferior to lumpectomy incision. Focused MLD on these 2 areas in supine and Lt S/L. Issued more stockinette for pt to wear over her 1/4" gray foam that she has been compliant about wearing at inferior and medial aspect of breast.    OBJECTIVE IMPAIRMENTS increased fascial restrictions.      PARTICIPATION LIMITATIONS: ongoing chemo    PERSONAL FACTORS Past/current experiences are also affecting patient's functional outcome.  Pt has recently completed PT for treatment of similar symptoms    REHAB POTENTIAL: Good   CLINICAL DECISION MAKING: Stable/uncomplicated   EVALUATION COMPLEXITY: Low   GOALS: Goals reviewed with patient? Yes   LONG TERM GOALS: Target date:  11/15/2021   Pt will report that symptoms of cording are decreased by 75% and that she knows how to manage them  Baseline: Goal status: PARTIALLY MET -09/15/21 40% better; pt did not rate today but reports though still present, this is much improved - 10/18/21   2.  Pt will have a compression sleeve to wear prophylactically for flying and at high elevations  Baseline:  Goal status: MET - 09/15/21 she has obtained a sleeve but will bring it in to be assessed; pt has received sleeve and wore this to fly recently - 10/18/21 3. Pt will demonstrate a 75% improvement in new swelling in Rt breast.    Baseline: New swelling noted with peau d'orange  Goal Status: NEW -  10/18/21   PLAN: PT FREQUENCY: 1-2x/week   PT DURATION: 4 weeks   PLANNED INTERVENTIONS: manual techniques, manual lymph drainage, patient  eduction, orthotic fit, therapeutic exercise    PLAN FOR NEXT SESSION:  Cont manual techniques as indicated, assessing for new areas of fibrosis and peau d'orange on Rt breast due to currently receiving radiation and being mindful of skin from same  Otelia Limes, PTA 11/10/2021, 11:18 AM

## 2021-11-11 ENCOUNTER — Encounter: Payer: Self-pay | Admitting: Hematology and Oncology

## 2021-11-11 ENCOUNTER — Ambulatory Visit
Admission: RE | Admit: 2021-11-11 | Discharge: 2021-11-11 | Disposition: A | Discharge status: Home / Self Care | Payer: BC Managed Care – PPO | Source: Ambulatory Visit | Attending: Radiation Oncology | Admitting: Radiation Oncology

## 2021-11-11 ENCOUNTER — Other Ambulatory Visit: Payer: Self-pay

## 2021-11-11 DIAGNOSIS — Z510 Encounter for antineoplastic radiation therapy: Secondary | ICD-10-CM | POA: Diagnosis not present

## 2021-11-11 LAB — RAD ONC ARIA SESSION SUMMARY
Course Elapsed Days: 31
Plan Fractions Treated to Date: 23
Plan Fractions Treated to Date: 23
Plan Prescribed Dose Per Fraction: 1.8 Gy
Plan Prescribed Dose Per Fraction: 1.8 Gy
Plan Total Fractions Prescribed: 28
Plan Total Fractions Prescribed: 28
Plan Total Prescribed Dose: 50.4 Gy
Plan Total Prescribed Dose: 50.4 Gy
Reference Point Dosage Given to Date: 41.4 Gy
Reference Point Dosage Given to Date: 41.4 Gy
Reference Point Session Dosage Given: 1.8 Gy
Reference Point Session Dosage Given: 1.8 Gy
Session Number: 23

## 2021-11-14 ENCOUNTER — Other Ambulatory Visit: Payer: Self-pay

## 2021-11-14 ENCOUNTER — Ambulatory Visit
Admission: RE | Admit: 2021-11-14 | Discharge: 2021-11-14 | Disposition: A | Discharge status: Home / Self Care | Payer: BC Managed Care – PPO | Source: Ambulatory Visit | Attending: Radiation Oncology | Admitting: Radiation Oncology

## 2021-11-14 DIAGNOSIS — Z510 Encounter for antineoplastic radiation therapy: Secondary | ICD-10-CM | POA: Diagnosis not present

## 2021-11-14 LAB — RAD ONC ARIA SESSION SUMMARY
Course Elapsed Days: 34
Plan Fractions Treated to Date: 24
Plan Fractions Treated to Date: 24
Plan Prescribed Dose Per Fraction: 1.8 Gy
Plan Prescribed Dose Per Fraction: 1.8 Gy
Plan Total Fractions Prescribed: 28
Plan Total Fractions Prescribed: 28
Plan Total Prescribed Dose: 50.4 Gy
Plan Total Prescribed Dose: 50.4 Gy
Reference Point Dosage Given to Date: 43.2 Gy
Reference Point Dosage Given to Date: 43.2 Gy
Reference Point Session Dosage Given: 1.8 Gy
Reference Point Session Dosage Given: 1.8 Gy
Session Number: 24

## 2021-11-15 ENCOUNTER — Other Ambulatory Visit: Payer: Self-pay

## 2021-11-15 ENCOUNTER — Telehealth: Payer: Self-pay | Admitting: *Deleted

## 2021-11-15 ENCOUNTER — Ambulatory Visit
Admission: RE | Admit: 2021-11-15 | Discharge: 2021-11-15 | Disposition: A | Discharge status: Home / Self Care | Payer: BC Managed Care – PPO | Source: Ambulatory Visit | Attending: Radiation Oncology | Admitting: Radiation Oncology

## 2021-11-15 DIAGNOSIS — Z510 Encounter for antineoplastic radiation therapy: Secondary | ICD-10-CM | POA: Diagnosis not present

## 2021-11-15 LAB — RAD ONC ARIA SESSION SUMMARY
Course Elapsed Days: 35
Plan Fractions Treated to Date: 25
Plan Fractions Treated to Date: 25
Plan Prescribed Dose Per Fraction: 1.8 Gy
Plan Prescribed Dose Per Fraction: 1.8 Gy
Plan Total Fractions Prescribed: 28
Plan Total Fractions Prescribed: 28
Plan Total Prescribed Dose: 50.4 Gy
Plan Total Prescribed Dose: 50.4 Gy
Reference Point Dosage Given to Date: 45 Gy
Reference Point Dosage Given to Date: 45 Gy
Reference Point Session Dosage Given: 1.8 Gy
Reference Point Session Dosage Given: 1.8 Gy
Session Number: 25

## 2021-11-15 NOTE — Telephone Encounter (Signed)
[employer] claim paperwork completed by this nurse today s currently placed in designated mail bin for collaborative pick up.  Awaiting provider review and signature.    Patient left message to check status of form received 11/03/2021.  "Started in March.  Needs same start date with a new end date because receiving radiation treatments longer than originally planned.  If not returned this week, my insurance is about to cut me off.  What's going on?  I sent portal message and asked twice about status of form?"  Spoke with registrar earlier who informed patient form should be completed this week.  Currently .     Message left requesting Natasha Ortega ([phone]) return call to receive specific dates for renewal.  Original form reads start date of intermittent FMLA/Disability begins 04/26/2021 with end date of 04/27/2022.  "They informed me the paperwork they received reads end date is 10/27/2021 is why they need an extension."  Advised of internal policy of company is to renew every six months.  Copy scanned to EMR reads 04/27/2022.  Updated current request for 10/28/2021 through 10/29/2022.

## 2021-11-16 ENCOUNTER — Other Ambulatory Visit: Payer: Self-pay

## 2021-11-16 ENCOUNTER — Ambulatory Visit: Payer: BC Managed Care – PPO

## 2021-11-16 ENCOUNTER — Encounter: Payer: Self-pay | Admitting: Radiation Oncology

## 2021-11-16 ENCOUNTER — Ambulatory Visit
Admission: RE | Admit: 2021-11-16 | Discharge: 2021-11-16 | Disposition: A | Discharge status: Home / Self Care | Payer: BC Managed Care – PPO | Source: Ambulatory Visit | Attending: Radiation Oncology | Admitting: Radiation Oncology

## 2021-11-16 DIAGNOSIS — Z510 Encounter for antineoplastic radiation therapy: Secondary | ICD-10-CM | POA: Diagnosis not present

## 2021-11-16 DIAGNOSIS — Z483 Aftercare following surgery for neoplasm: Secondary | ICD-10-CM | POA: Diagnosis not present

## 2021-11-16 DIAGNOSIS — Z171 Estrogen receptor negative status [ER-]: Secondary | ICD-10-CM

## 2021-11-16 DIAGNOSIS — I890 Lymphedema, not elsewhere classified: Secondary | ICD-10-CM

## 2021-11-16 DIAGNOSIS — R293 Abnormal posture: Secondary | ICD-10-CM

## 2021-11-16 LAB — RAD ONC ARIA SESSION SUMMARY
Course Elapsed Days: 36
Plan Fractions Treated to Date: 26
Plan Fractions Treated to Date: 26
Plan Prescribed Dose Per Fraction: 1.8 Gy
Plan Prescribed Dose Per Fraction: 1.8 Gy
Plan Total Fractions Prescribed: 28
Plan Total Fractions Prescribed: 28
Plan Total Prescribed Dose: 50.4 Gy
Plan Total Prescribed Dose: 50.4 Gy
Reference Point Dosage Given to Date: 46.8 Gy
Reference Point Dosage Given to Date: 46.8 Gy
Reference Point Session Dosage Given: 1.8 Gy
Reference Point Session Dosage Given: 1.8 Gy
Session Number: 26

## 2021-11-16 NOTE — Therapy (Addendum)
OUTPATIENT PHYSICAL THERAPY TREATMENT NOTE   Patient Name: Natasha Ortega MRN: [id number] DOB:25-Aug-1970, 51 y.o., female Today's Date: 11/16/2021  PCP: Louretta Shorten  REFERRING PROVIDER: Nicholas Lose, MD  END OF SESSION:   PT End of Session - 11/16/21 1014     Visit Number 18    Number of Visits 29    Date for PT Re-Evaluation 12/14/21    PT Start Time 1010    PT Stop Time 1104    PT Time Calculation (min) 54 min    Activity Tolerance Patient tolerated treatment well    Behavior During Therapy [facility] for tasks assessed/performed             Past Medical History:  Diagnosis Date   Cancer ([place]) 04/21/2021   right breast IDC   Headache    migraines   Past Surgical History:  Procedure Laterality Date   BREAST LUMPECTOMY WITH RADIOACTIVE SEED AND SENTINEL LYMPH NODE BIOPSY Right 05/11/2021   Procedure: RIGHT BREAST LUMPECTOMY WITH RADIOACTIVE SEED AND SENTINEL LYMPH NODE BIOPSY;  Surgeon: [name], MD;  Location: [place];  Service: General;  Laterality: Right;   PORT-A-CATH REMOVAL Left 09/13/2021   Procedure: REMOVAL PORT-A-CATH;  Surgeon: [name], MD;  Location: [place];  Service: General;  Laterality: Left;   PORTACATH PLACEMENT N/A 06/09/2021   Procedure: INSERTION PORT-A-CATH;  Surgeon: [name], MD;  Location: [place];  Service: General;  Laterality: N/A;   Patient Active Problem List   Diagnosis Date Noted   Port-A-Cath in place 06/21/2021   Genetic testing 05/09/2021   Family history of breast cancer 05/05/2021   Malignant neoplasm of upper-outer quadrant of right breast in female, estrogen receptor negative ([place]) 04/28/2021    REFERRING DIAG: malignant neoplasm of right breast   THERAPY DIAG:  Lymphedema, not elsewhere classified - Plan: PT plan of care cert/re-cert  Aftercare following surgery for neoplasm - Plan: PT plan of care cert/re-cert  Abnormal posture - Plan: PT plan of care  cert/re-cert  Malignant neoplasm of upper-outer quadrant of right breast in female, estrogen receptor negative ([place]) - Plan: PT plan of care cert/re-cert  Rationale for Evaluation and Treatment Rehabilitation  PERTINENT HISTORY: Patient was diagnosed on 04/28/2021 with right grade 2 invasive ductal carcinoma breast cancer. She underwent a right lumpectomy and sentinel node biopsy on 05/11/2021. She had 1 node with micromets and 2 negative nodes (3 total) removed. It is ER/PR negative and HER2 negative with a Ki67 of 40%.   PRECAUTIONS:  ongoing chemo   SUBJECTIVE: I've started feeling my skin stretch at the clavicle and sternum with certain stretches. The peau d'orange is also worsened with more fibrosis and cording at the lumpectomy incision.   PAIN:  Are you having pain? No   OBJECTIVE:  UPPER EXTREMITY AROM/PROM:   A/PROM RIGHT   eval    Shoulder extension    Shoulder flexion 173  Shoulder abduction 177  Shoulder internal rotation    Shoulder external rotation                            (Blank rows = not tested)       CERVICAL AROM: All within normal limits:          UPPER EXTREMITY STRENGTH: WNL      LYMPHEDEMA ASSESSMENTS:  not tested as pt had SOZO screen yesterday and no lymphedema present  TODAY'S TREATMENT  11/16/21: Manual Therapy  MLD: In Supine: Short neck (posterior to radiated tissue on Rt), superficial and deep abdominals, Lt axillary nodes, Lt intact upper quadrant, anterior inter-axillary anastomosis, next Rt inguinal nodes and Rt axillo-inguinal, then continued to focused on medial aspect of breast, also focused on area inferior to incision when in Lt S/L and focused on Rt axillo-inguinal anastomosis, then finished retracing steps in supine  ending with LNs. MFR: Held off on this today due to increased redness and skin fragility at incision due to boost treatments  11/10/21: Manual Therapy  MLD: In Supine: Short neck, superficial and deep  abdominals, Lt axillary nodes, Lt intact upper quadrant, anterior inter-axillary anastomosis, next Rt inguinal nodes and Rt axillo-inguinal, then continued to focused on medial aspect of breast, also focused on area inferior to incision when in Lt S/L and focused on Rt axillo-inguinal anastomosis, then finished retracing steps in supine  ending with LNs. MFR to scar tissue at lumpectomy incision   11/08/21: Manual Therapy  MLD: In Supine: Short neck, superficial and deep abdominals, Lt axillary nodes, Lt intact upper quadrant, then anterior inter-axillary anastomosis, next Rt inguinal nodes and Rt axillo-inguinal, then continued to focused on medial aspect of breast and finished retracing steps ending with LNs.      PATIENT EDUCATION:  Education details: Self breast MLD Person educated: Patient Education method: Explanation, Demonstration, and Tactile cues Education comprehension: verbalized understanding and returned demonstration     HOME EXERCISE PROGRAM: Add stretch as above ; self Rt breast MLD   ASSESSMENT:   CLINICAL IMPRESSION: Pt with continued increased peau d'orange noted at inferior aspect of breast and more so today at lumpectomy incision which is to be expected as she is undergoing radiation. Avoided MFR over incision today as pts skin fragility is increasing from radiation. Renewal done this session to further assist pt with managing symptoms and preventing decline which helps lower risk of infection and lymphedema.   OBJECTIVE IMPAIRMENTS increased fascial restrictions.      PARTICIPATION LIMITATIONS: ongoing chemo    PERSONAL FACTORS Past/current experiences are also affecting patient's functional outcome.  Pt has recently completed PT for treatment of similar symptoms    REHAB POTENTIAL: Good   CLINICAL DECISION MAKING: Stable/uncomplicated   EVALUATION COMPLEXITY: Low   GOALS: Goals reviewed with patient? Yes   LONG TERM GOALS: Target date:  11/15/2021   Pt  will report that symptoms of cording are decreased by 75% and that she knows how to manage them  Baseline: Goal status: MET -09/15/21 40% better; pt did not rate today but reports though still present, this is much improved - 10/18/21; pt reports 80% improved at this time - 11/16/21   2.  Pt will have a compression sleeve to wear prophylactically for flying and at high elevations  Baseline:  Goal status: MET - 09/15/21 she has obtained a sleeve but will bring it in to be assessed; pt has received sleeve and wore this to fly recently - 10/18/21 3. Pt will demonstrate a 75% improvement in new swelling in Rt breast.    Baseline: New swelling noted with peau d'orange; currently pt is undergoing radiation so this is more widespread and has worsened due to her cancer treatment  Goal Status: ONGOING - 12/14/21   PLAN: PT FREQUENCY: 1-2x/week   PT DURATION: 4 weeks   PLANNED INTERVENTIONS: manual techniques, manual lymph drainage, patient  eduction, orthotic fit, therapeutic exercise    PLAN FOR NEXT SESSION:  Renewal  done this session. Cont manual techniques as indicated, assessing for new areas of fibrosis and peau d'orange on Rt breast due to currently receiving radiation and being mindful of skin from same  Collie Siad, PTA 11/16/2021, 2:56 PM

## 2021-11-16 NOTE — Progress Notes (Signed)
  Radiation Oncology         [phone] ________________________________  Name: Natasha Ortega MRN: [id number]  Date: 11/16/2021  DOB: 1970/06/12  SIMULATION NOTE   NARRATIVE:  The patient underwent simulation today for ongoing radiation therapy.  The existing CT study set was employed for the purpose of virtual treatment planning.  The target and avoidance structures were reviewed and modified as necessary.  Treatment planning then occurred.  The radiation boost prescription was entered and confirmed.  A total of 3 complex treatment devices were fabricated in the form of multi-leaf collimators to shape radiation around the targets while maximally excluding nearby normal structures. I have requested : Isodose Plan.    PLAN:  This modified radiation beam arrangement is intended to continue the current radiation dose to an additional 10 Gy in 5 fractions for a total cumulative dose of 60.4 Gy.    ------------------------------------------------  Jodelle Gross, MD, PhD

## 2021-11-16 NOTE — Addendum Note (Signed)
Addended by: Manus Gunning L on: 11/16/2021 02:57 PM   Modules accepted: Orders

## 2021-11-17 ENCOUNTER — Ambulatory Visit: Payer: BC Managed Care – PPO

## 2021-11-17 ENCOUNTER — Other Ambulatory Visit: Payer: Self-pay

## 2021-11-17 ENCOUNTER — Ambulatory Visit
Admission: RE | Admit: 2021-11-17 | Discharge: 2021-11-17 | Disposition: A | Discharge status: Home / Self Care | Payer: BC Managed Care – PPO | Source: Ambulatory Visit | Attending: Radiation Oncology | Admitting: Radiation Oncology

## 2021-11-17 DIAGNOSIS — Z510 Encounter for antineoplastic radiation therapy: Secondary | ICD-10-CM | POA: Diagnosis not present

## 2021-11-17 LAB — RAD ONC ARIA SESSION SUMMARY
Course Elapsed Days: 37
Plan Fractions Treated to Date: 27
Plan Fractions Treated to Date: 27
Plan Prescribed Dose Per Fraction: 1.8 Gy
Plan Prescribed Dose Per Fraction: 1.8 Gy
Plan Total Fractions Prescribed: 28
Plan Total Fractions Prescribed: 28
Plan Total Prescribed Dose: 50.4 Gy
Plan Total Prescribed Dose: 50.4 Gy
Reference Point Dosage Given to Date: 48.6 Gy
Reference Point Dosage Given to Date: 48.6 Gy
Reference Point Session Dosage Given: 1.8 Gy
Reference Point Session Dosage Given: 1.8 Gy
Session Number: 27

## 2021-11-18 ENCOUNTER — Other Ambulatory Visit: Payer: Self-pay

## 2021-11-18 ENCOUNTER — Ambulatory Visit: Payer: BC Managed Care – PPO

## 2021-11-18 ENCOUNTER — Ambulatory Visit
Admission: RE | Admit: 2021-11-18 | Discharge: 2021-11-18 | Disposition: A | Discharge status: Home / Self Care | Payer: BC Managed Care – PPO | Source: Ambulatory Visit | Attending: Radiation Oncology | Admitting: Radiation Oncology

## 2021-11-18 DIAGNOSIS — Z171 Estrogen receptor negative status [ER-]: Secondary | ICD-10-CM

## 2021-11-18 DIAGNOSIS — Z510 Encounter for antineoplastic radiation therapy: Secondary | ICD-10-CM | POA: Diagnosis not present

## 2021-11-18 LAB — RAD ONC ARIA SESSION SUMMARY
Course Elapsed Days: 38
Plan Fractions Treated to Date: 28
Plan Fractions Treated to Date: 28
Plan Prescribed Dose Per Fraction: 1.8 Gy
Plan Prescribed Dose Per Fraction: 1.8 Gy
Plan Total Fractions Prescribed: 28
Plan Total Fractions Prescribed: 28
Plan Total Prescribed Dose: 50.4 Gy
Plan Total Prescribed Dose: 50.4 Gy
Reference Point Dosage Given to Date: 50.4 Gy
Reference Point Dosage Given to Date: 50.4 Gy
Reference Point Session Dosage Given: 1.8 Gy
Reference Point Session Dosage Given: 1.8 Gy
Session Number: 28

## 2021-11-18 MED ORDER — RADIAPLEXRX EX GEL: Freq: Once | CUTANEOUS | Status: AC

## 2021-11-21 ENCOUNTER — Other Ambulatory Visit: Payer: Self-pay

## 2021-11-21 ENCOUNTER — Ambulatory Visit: Payer: BC Managed Care – PPO

## 2021-11-21 ENCOUNTER — Ambulatory Visit
Admission: RE | Admit: 2021-11-21 | Discharge: 2021-11-21 | Disposition: A | Discharge status: Home / Self Care | Payer: BC Managed Care – PPO | Source: Ambulatory Visit | Attending: Radiation Oncology | Admitting: Radiation Oncology

## 2021-11-21 DIAGNOSIS — C50411 Malignant neoplasm of upper-outer quadrant of right female breast: Secondary | ICD-10-CM | POA: Diagnosis present

## 2021-11-21 DIAGNOSIS — Z171 Estrogen receptor negative status [ER-]: Secondary | ICD-10-CM | POA: Diagnosis present

## 2021-11-21 LAB — RAD ONC ARIA SESSION SUMMARY
Course Elapsed Days: 41
Plan Fractions Treated to Date: 1
Plan Prescribed Dose Per Fraction: 2 Gy
Plan Total Fractions Prescribed: 5
Plan Total Prescribed Dose: 10 Gy
Reference Point Dosage Given to Date: 2 Gy
Reference Point Session Dosage Given: 2 Gy
Session Number: 29

## 2021-11-22 ENCOUNTER — Other Ambulatory Visit: Payer: Self-pay

## 2021-11-22 ENCOUNTER — Ambulatory Visit
Admission: RE | Admit: 2021-11-22 | Discharge: 2021-11-22 | Disposition: A | Discharge status: Home / Self Care | Payer: BC Managed Care – PPO | Source: Ambulatory Visit | Attending: Radiation Oncology | Admitting: Radiation Oncology

## 2021-11-22 ENCOUNTER — Ambulatory Visit: Payer: BC Managed Care – PPO

## 2021-11-22 DIAGNOSIS — C50411 Malignant neoplasm of upper-outer quadrant of right female breast: Secondary | ICD-10-CM | POA: Diagnosis not present

## 2021-11-22 LAB — RAD ONC ARIA SESSION SUMMARY
Course Elapsed Days: 42
Plan Fractions Treated to Date: 2
Plan Prescribed Dose Per Fraction: 2 Gy
Plan Total Fractions Prescribed: 5
Plan Total Prescribed Dose: 10 Gy
Reference Point Dosage Given to Date: 4 Gy
Reference Point Session Dosage Given: 2 Gy
Session Number: 30

## 2021-11-22 NOTE — Telephone Encounter (Signed)
Form received signed by provider.  Returned as a precaution on 11/16/2021.   Message from KENDALYN CRANFIELD 11/21/2021 that her claims manager reviewed initial claim end date of 04/27/2022 and would accept this as the continuance. Process completed.

## 2021-11-23 ENCOUNTER — Other Ambulatory Visit: Payer: Self-pay

## 2021-11-23 ENCOUNTER — Ambulatory Visit: Payer: BC Managed Care – PPO

## 2021-11-23 ENCOUNTER — Ambulatory Visit
Admission: RE | Admit: 2021-11-23 | Discharge: 2021-11-23 | Disposition: A | Discharge status: Home / Self Care | Payer: BC Managed Care – PPO | Source: Ambulatory Visit | Attending: Radiation Oncology | Admitting: Radiation Oncology

## 2021-11-23 DIAGNOSIS — C50411 Malignant neoplasm of upper-outer quadrant of right female breast: Secondary | ICD-10-CM | POA: Diagnosis not present

## 2021-11-23 LAB — RAD ONC ARIA SESSION SUMMARY
Course Elapsed Days: 43
Plan Fractions Treated to Date: 3
Plan Prescribed Dose Per Fraction: 2 Gy
Plan Total Fractions Prescribed: 5
Plan Total Prescribed Dose: 10 Gy
Reference Point Dosage Given to Date: 6 Gy
Reference Point Session Dosage Given: 2 Gy
Session Number: 31

## 2021-11-24 ENCOUNTER — Ambulatory Visit
Admission: RE | Admit: 2021-11-24 | Discharge: 2021-11-24 | Disposition: A | Discharge status: Home / Self Care | Payer: BC Managed Care – PPO | Source: Ambulatory Visit | Attending: Radiation Oncology | Admitting: Radiation Oncology

## 2021-11-24 ENCOUNTER — Encounter: Payer: Self-pay | Admitting: *Deleted

## 2021-11-24 ENCOUNTER — Ambulatory Visit: Payer: BC Managed Care – PPO

## 2021-11-24 ENCOUNTER — Other Ambulatory Visit: Payer: Self-pay

## 2021-11-24 DIAGNOSIS — Z171 Estrogen receptor negative status [ER-]: Secondary | ICD-10-CM

## 2021-11-24 DIAGNOSIS — C50411 Malignant neoplasm of upper-outer quadrant of right female breast: Secondary | ICD-10-CM | POA: Diagnosis not present

## 2021-11-24 LAB — RAD ONC ARIA SESSION SUMMARY
Course Elapsed Days: 44
Plan Fractions Treated to Date: 4
Plan Prescribed Dose Per Fraction: 2 Gy
Plan Total Fractions Prescribed: 5
Plan Total Prescribed Dose: 10 Gy
Reference Point Dosage Given to Date: 8 Gy
Reference Point Session Dosage Given: 2 Gy
Session Number: 32

## 2021-11-25 ENCOUNTER — Other Ambulatory Visit: Payer: Self-pay

## 2021-11-25 ENCOUNTER — Ambulatory Visit: Payer: BC Managed Care – PPO

## 2021-11-25 ENCOUNTER — Encounter: Payer: Self-pay | Admitting: Radiation Oncology

## 2021-11-25 ENCOUNTER — Ambulatory Visit
Admission: RE | Admit: 2021-11-25 | Discharge: 2021-11-25 | Disposition: A | Discharge status: Home / Self Care | Payer: BC Managed Care – PPO | Source: Ambulatory Visit | Attending: Radiation Oncology | Admitting: Radiation Oncology

## 2021-11-25 DIAGNOSIS — Z171 Estrogen receptor negative status [ER-]: Secondary | ICD-10-CM

## 2021-11-25 DIAGNOSIS — C50411 Malignant neoplasm of upper-outer quadrant of right female breast: Secondary | ICD-10-CM | POA: Diagnosis not present

## 2021-11-25 LAB — RAD ONC ARIA SESSION SUMMARY
Course Elapsed Days: 45
Plan Fractions Treated to Date: 5
Plan Prescribed Dose Per Fraction: 2 Gy
Plan Total Fractions Prescribed: 5
Plan Total Prescribed Dose: 10 Gy
Reference Point Dosage Given to Date: 10 Gy
Reference Point Session Dosage Given: 2 Gy
Session Number: 33

## 2021-11-25 MED ORDER — RADIAPLEXRX EX GEL: Freq: Once | CUTANEOUS | Status: AC

## 2021-11-28 ENCOUNTER — Ambulatory Visit: Payer: BC Managed Care – PPO

## 2021-12-08 ENCOUNTER — Encounter: Payer: Self-pay | Admitting: Hematology and Oncology

## 2021-12-08 ENCOUNTER — Ambulatory Visit: Payer: BC Managed Care – PPO | Attending: Hematology and Oncology

## 2021-12-08 DIAGNOSIS — C50411 Malignant neoplasm of upper-outer quadrant of right female breast: Secondary | ICD-10-CM | POA: Insufficient documentation

## 2021-12-08 DIAGNOSIS — Z483 Aftercare following surgery for neoplasm: Secondary | ICD-10-CM | POA: Insufficient documentation

## 2021-12-08 DIAGNOSIS — I890 Lymphedema, not elsewhere classified: Secondary | ICD-10-CM | POA: Insufficient documentation

## 2021-12-08 DIAGNOSIS — Z171 Estrogen receptor negative status [ER-]: Secondary | ICD-10-CM | POA: Diagnosis present

## 2021-12-08 DIAGNOSIS — R293 Abnormal posture: Secondary | ICD-10-CM | POA: Insufficient documentation

## 2021-12-08 NOTE — Progress Notes (Signed)
                                                                                                                                                             Patient Name: Natasha Ortega MRN: [id number] DOB: 09-04-1970 Referring Physician: Nicholas Lose (Profile Not Attached) Date of Service: 11/25/2021 [facility] [place]                                                        End Of Treatment Note  Diagnoses: C50.411-Malignant neoplasm of upper-outer quadrant of right female breast  Cancer Staging:  Stage IIA, pT1bN1M0, grade 2, triple negative invasive ductal carcinoma of the right breast   Intent: Curative  Radiation Treatment Dates: 10/11/2021 through 11/25/2021 Site Technique Total Dose (Gy) Dose per Fx (Gy) Completed Fx Beam Energies  Breast, Right: Breast_R 3D 50.4/50.4 1.8 28/28 10X  Breast, Right: Breast_R_SCLV 3D 50.4/50.4 1.8 28/28 10X, 15X  Breast, Right: Breast_R_Bst 3D 10/10 2 5/5 6X   Narrative: The patient tolerated radiation therapy relatively well. She developed fatigue and anticipated skin changes in the treatment field.   Plan: The patient will receive a call in about one month from the radiation oncology department. She will continue follow up with Dr. Lindi Adie as well.   ________________________________________________    Carola Rhine, [facility]

## 2021-12-08 NOTE — Therapy (Signed)
OUTPATIENT PHYSICAL THERAPY TREATMENT NOTE   Patient Name: Natasha Ortega MRN: [id number] DOB:18-May-1970, 51 y.o., female Today's Date: 12/08/2021  PCP: Louretta Shorten  REFERRING PROVIDER: Nicholas Lose, MD  END OF SESSION:   PT End of Session - 12/08/21 0909     Visit Number 19    Number of Visits 29    Date for PT Re-Evaluation 12/14/21    PT Start Time 0905    PT Stop Time 1005    PT Time Calculation (min) 60 min    Activity Tolerance Patient tolerated treatment well    Behavior During Therapy [facility] for tasks assessed/performed             Past Medical History:  Diagnosis Date   Cancer ([place]) 04/21/2021   right breast IDC   Headache    migraines   Past Surgical History:  Procedure Laterality Date   BREAST LUMPECTOMY WITH RADIOACTIVE SEED AND SENTINEL LYMPH NODE BIOPSY Right 05/11/2021   Procedure: RIGHT BREAST LUMPECTOMY WITH RADIOACTIVE SEED AND SENTINEL LYMPH NODE BIOPSY;  Surgeon: [name], MD;  Location: [place];  Service: General;  Laterality: Right;   PORT-A-CATH REMOVAL Left 09/13/2021   Procedure: REMOVAL PORT-A-CATH;  Surgeon: [name], MD;  Location: [place];  Service: General;  Laterality: Left;   PORTACATH PLACEMENT N/A 06/09/2021   Procedure: INSERTION PORT-A-CATH;  Surgeon: [name], MD;  Location: [place];  Service: General;  Laterality: N/A;   Patient Active Problem List   Diagnosis Date Noted   Port-A-Cath in place 06/21/2021   Genetic testing 05/09/2021   Family history of breast cancer 05/05/2021   Malignant neoplasm of upper-outer quadrant of right breast in female, estrogen receptor negative ([place]) 04/28/2021    REFERRING DIAG: malignant neoplasm of right breast   THERAPY DIAG:  Lymphedema, not elsewhere classified  Aftercare following surgery for neoplasm  Abnormal posture  Malignant neoplasm of upper-outer quadrant of right breast in female, estrogen receptor negative  ([place])  Rationale for Evaluation and Treatment Rehabilitation  PERTINENT HISTORY: Patient was diagnosed on 04/28/2021 with right grade 2 invasive ductal carcinoma breast cancer. She underwent a right lumpectomy and sentinel node biopsy on 05/11/2021. She had 1 node with micromets and 2 negative nodes (3 total) removed. It is ER/PR negative and HER2 negative with a Ki67 of 40%.   PRECAUTIONS:  ongoing chemo   SUBJECTIVE: My skin got really bad at the end of radiation  but it's been almost 2 weeks and I can tell the skin is doing a lot better. The cording got really bad and the swelling along with it for awhile but it's all starting to get better. I've continued with the MLD but been gentle with the stretches like you told me because I could feel the skin pulling for awhile. Overall it's so much better now than it was when I finished 2 weeks ago.   PAIN:  Are you having pain? No   OBJECTIVE:  UPPER EXTREMITY AROM/PROM:   A/PROM RIGHT   eval    Shoulder extension    Shoulder flexion 173  Shoulder abduction 177   Shoulder internal rotation    Shoulder external rotation                            (Blank rows = not tested)       CERVICAL AROM: All within normal limits:  UPPER EXTREMITY STRENGTH: WNL      LYMPHEDEMA ASSESSMENTS:  not tested as pt had SOZO screen yesterday and no lymphedema present     L-DEX FLOWSHEETS - 12/08/21 0900       L-DEX LYMPHEDEMA SCREENING   Measurement Type Unilateral    L-DEX MEASUREMENT EXTREMITY Upper Extremity    POSITION  Standing    DOMINANT SIDE Right    At Risk Side Right    BASELINE SCORE (UNILATERAL) -5.6    L-DEX SCORE (UNILATERAL) -7.3    VALUE CHANGE (UNILAT) -1.7                TODAY'S TREATMENT  12/08/21: Manual Therapy  MLD: In Supine: Short neck (posterior to radiated tissue on Rt), superficial and deep abdominals, Lt axillary nodes, Lt intact upper quadrant, anterior inter-axillary anastomosis, next Rt inguinal  nodes and Rt axillo-inguinal anastomosis, then focus to medial aspect of breast, also focused on area inferior to incision when in Lt S/L redirecting that to Rt axillo-inguinal anastomosis, then finished retracing steps in supine  ending with LNs. MFR: Continued to hold off on this today; her skin is healing well but still peeling at lumpectomy incision and skin fragility is still increased as well; instructed pt to cont to hold of fhere at least for a few more days until peeling has stopped   11/16/21: Manual Therapy  MLD: In Supine: Short neck (posterior to radiated tissue on Rt), superficial and deep abdominals, Lt axillary nodes, Lt intact upper quadrant, anterior inter-axillary anastomosis, next Rt inguinal nodes and Rt axillo-inguinal, then continued to focused on medial aspect of breast, also focused on area inferior to incision when in Lt S/L and focused on Rt axillo-inguinal anastomosis, then finished retracing steps in supine  ending with LNs. MFR: Held off on this today due to increased redness and skin fragility at incision due to boost treatments  11/10/21: Manual Therapy  MLD: In Supine: Short neck, superficial and deep abdominals, Lt axillary nodes, Lt intact upper quadrant, anterior inter-axillary anastomosis, next Rt inguinal nodes and Rt axillo-inguinal, then continued to focused on medial aspect of breast, also focused on area inferior to incision when in Lt S/L and focused on Rt axillo-inguinal anastomosis, then finished retracing steps in supine  ending with LNs. MFR to scar tissue at lumpectomy incision      PATIENT EDUCATION:  Education details: Self breast MLD Person educated: Patient Education method: Explanation, Demonstration, and Tactile cues Education comprehension: verbalized understanding and returned demonstration     HOME EXERCISE PROGRAM: Add stretch as above ; self Rt breast MLD   ASSESSMENT:   CLINICAL IMPRESSION: Pt returns after having completed  radiation about 2 weeks ago. She reports increased redness, blisters and then peeling at area of boost (clavicular and over lumpectomy incision). Clavicular area is healing well, lumpectomy incision is still reddened and peeling but overall skin appears to be healing well, albeit slower. Continued to avoid MFR here so as not to hinder healing and educated pt to do the same, at least for a few more days. Continued with MLD to Rt breast focusing on fibrosis inferior to lumpectomy incision and at peau d'orange at medial aspect of breast. Pt plans to cancel next week appts and may return one more time for final assess week of 12/19/21 and then plan to D/C then if she feels she is independent at level of recovery from radiation at that time.   OBJECTIVE IMPAIRMENTS increased fascial restrictions.  PARTICIPATION LIMITATIONS: ongoing chemo    PERSONAL FACTORS Past/current experiences are also affecting patient's functional outcome.  Pt has recently completed PT for treatment of similar symptoms    REHAB POTENTIAL: Good   CLINICAL DECISION MAKING: Stable/uncomplicated   EVALUATION COMPLEXITY: Low   GOALS: Goals reviewed with patient? Yes   LONG TERM GOALS: Target date:  11/15/2021   Pt will report that symptoms of cording are decreased by 75% and that she knows how to manage them  Baseline: Goal status: MET -09/15/21 40% better; pt did not rate today but reports though still present, this is much improved - 10/18/21; pt reports 80% improved at this time - 11/16/21   2.  Pt will have a compression sleeve to wear prophylactically for flying and at high elevations  Baseline:  Goal status: MET - 09/15/21 she has obtained a sleeve but will bring it in to be assessed; pt has received sleeve and wore this to fly recently - 10/18/21  3. Pt will demonstrate a 75% improvement in new swelling in Rt breast.    Baseline: New swelling noted with peau d'orange; currently pt is undergoing radiation so this is  more widespread and has worsened due to her cancer treatment  Goal Status: ONGOING - 12/14/21   PLAN: PT FREQUENCY: 1-2x/week   PT DURATION: 4 weeks   PLANNED INTERVENTIONS: manual techniques, manual lymph drainage, patient  eduction, orthotic fit, therapeutic exercise    PLAN FOR NEXT SESSION:  Pt to return in about 2 weeks for potential final assess after having completed radiation and D/C. Cont manual techniques as indicated, assessing for new areas of fibrosis and peau d'orange on Rt breast due to currently receiving radiation and being mindful of skin from same  Collie Siad, PTA 12/08/2021, 10:43 AM

## 2021-12-20 ENCOUNTER — Ambulatory Visit: Payer: BC Managed Care – PPO

## 2021-12-21 ENCOUNTER — Telehealth: Payer: Self-pay | Admitting: Adult Health

## 2021-12-21 NOTE — Telephone Encounter (Signed)
Rescheduled appointment per provider PAL. Patient is aware of the changes made to her upcoming appointment. 

## 2021-12-22 ENCOUNTER — Ambulatory Visit: Payer: BC Managed Care – PPO

## 2021-12-27 ENCOUNTER — Ambulatory Visit: Payer: BC Managed Care – PPO | Attending: Hematology and Oncology

## 2021-12-27 DIAGNOSIS — Z171 Estrogen receptor negative status [ER-]: Secondary | ICD-10-CM | POA: Insufficient documentation

## 2021-12-27 DIAGNOSIS — I890 Lymphedema, not elsewhere classified: Secondary | ICD-10-CM | POA: Insufficient documentation

## 2021-12-27 DIAGNOSIS — Z483 Aftercare following surgery for neoplasm: Secondary | ICD-10-CM | POA: Insufficient documentation

## 2021-12-27 DIAGNOSIS — R293 Abnormal posture: Secondary | ICD-10-CM | POA: Insufficient documentation

## 2021-12-27 DIAGNOSIS — C50411 Malignant neoplasm of upper-outer quadrant of right female breast: Secondary | ICD-10-CM | POA: Insufficient documentation

## 2021-12-27 NOTE — Therapy (Addendum)
OUTPATIENT PHYSICAL THERAPY TREATMENT NOTE   Patient Name: Natasha Ortega MRN: [id number] DOB:[birth year], 51 y.o., female Today's Date: 12/27/2021  PCP: Candice Camp  REFERRING PROVIDER: Serena Croissant, MD  END OF SESSION:   PT End of Session - 12/27/21 0904     Visit Number 20    Number of Visits 29    Date for PT Re-Evaluation 12/14/21   D/C this visit   PT Start Time 0901    PT Stop Time 0957    PT Time Calculation (min) 56 min    Activity Tolerance Patient tolerated treatment well    Behavior During Therapy [facility] for tasks assessed/performed             Past Medical History:  Diagnosis Date   Cancer ([facility]) 04/21/2021   right breast IDC   Headache    migraines   Past Surgical History:  Procedure Laterality Date   BREAST LUMPECTOMY WITH RADIOACTIVE SEED AND SENTINEL LYMPH NODE BIOPSY Right 05/11/2021   Procedure: RIGHT BREAST LUMPECTOMY WITH RADIOACTIVE SEED AND SENTINEL LYMPH NODE BIOPSY;  Surgeon: Abigail Miyamoto, MD;  Location: [facility];  Service: General;  Laterality: Right;   PORT-A-CATH REMOVAL Left 09/13/2021   Procedure: REMOVAL PORT-A-CATH;  Surgeon: Abigail Miyamoto, MD;  Location: [facility];  Service: General;  Laterality: Left;   PORTACATH PLACEMENT N/A 06/09/2021   Procedure: INSERTION PORT-A-CATH;  Surgeon: Abigail Miyamoto, MD;  Location: [facility];  Service: General;  Laterality: N/A;   Patient Active Problem List   Diagnosis Date Noted   Port-A-Cath in place 06/21/2021   Genetic testing 05/09/2021   Family history of breast cancer 05/05/2021   Malignant neoplasm of upper-outer quadrant of right breast in female, estrogen receptor negative ([facility]) 04/28/2021    REFERRING DIAG: malignant neoplasm of right breast   THERAPY DIAG:  Lymphedema, not elsewhere classified  Aftercare following surgery for neoplasm  Abnormal posture  Malignant neoplasm of upper-outer quadrant of right breast in female, estrogen receptor  negative ([facility])  Rationale for Evaluation and Treatment Rehabilitation  PERTINENT HISTORY: Patient was diagnosed on 04/28/2021 with right grade 2 invasive ductal carcinoma breast cancer. She underwent a right lumpectomy and sentinel node biopsy on 05/11/2021. She had 1 node with micromets and 2 negative nodes (3 total) removed. It is ER/PR negative and HER2 negative with a Ki67 of 40%.   PRECAUTIONS:  ongoing chemo   SUBJECTIVE: I have doing really good and I think I'm ready to D/C today.   PAIN:  Are you having pain? No   OBJECTIVE:  UPPER EXTREMITY AROM/PROM:   A/PROM RIGHT   eval    Shoulder extension    Shoulder flexion 173  Shoulder abduction 177   Shoulder internal rotation    Shoulder external rotation                            (Blank rows = not tested)       CERVICAL AROM: All within normal limits:          UPPER EXTREMITY STRENGTH: WNL      LYMPHEDEMA ASSESSMENTS:  not tested as pt had SOZO screen yesterday and no lymphedema present          TODAY'S TREATMENT  12/27/21: Manual Therapy  MLD: In Supine: Short neck, superficial and deep abdominals, Lt axillary nodes, Lt intact upper quadrant, anterior inter-axillary anastomosis, next Rt inguinal nodes and Rt axillo-inguinal anastomosis, then focus to  medial aspect of breast, also focused on area inferior to incision when in Lt S/L redirecting that to Rt axillo-inguinal anastomosis, then finished retracing steps in supine ending with LNs. MFR: Able to resume this as her skin is well healed since radiation ended; MFR to mild cording superior to lumpectomy incision  12/08/21: Manual Therapy  MLD: In Supine: Short neck (posterior to radiated tissue on Rt), superficial and deep abdominals, Lt axillary nodes, Lt intact upper quadrant, anterior inter-axillary anastomosis, next Rt inguinal nodes and Rt axillo-inguinal anastomosis, then focus to medial aspect of breast, also focused on area inferior to incision when in Lt  S/L redirecting that to Rt axillo-inguinal anastomosis, then finished retracing steps in supine  ending with LNs. MFR: Continued to hold off on this today; her skin is healing well but still peeling at lumpectomy incision and skin fragility is still increased as well; instructed pt to cont to hold of fhere at least for a few more days until peeling has stopped   11/16/21: Manual Therapy  MLD: In Supine: Short neck (posterior to radiated tissue on Rt), superficial and deep abdominals, Lt axillary nodes, Lt intact upper quadrant, anterior inter-axillary anastomosis, next Rt inguinal nodes and Rt axillo-inguinal, then continued to focused on medial aspect of breast, also focused on area inferior to incision when in Lt S/L and focused on Rt axillo-inguinal anastomosis, then finished retracing steps in supine  ending with LNs. MFR: Held off on this today due to increased redness and skin fragility at incision due to boost treatments  11/10/21: Manual Therapy  MLD: In Supine: Short neck, superficial and deep abdominals, Lt axillary nodes, Lt intact upper quadrant, anterior inter-axillary anastomosis, next Rt inguinal nodes and Rt axillo-inguinal, then continued to focused on medial aspect of breast, also focused on area inferior to incision when in Lt S/L and focused on Rt axillo-inguinal anastomosis, then finished retracing steps in supine  ending with LNs. MFR to scar tissue at lumpectomy incision      PATIENT EDUCATION:  Education details: Self breast MLD Person educated: Patient Education method: Explanation, Demonstration, and Tactile cues Education comprehension: verbalized understanding and returned demonstration     HOME EXERCISE PROGRAM: Add stretch as above ; self Rt breast MLD   ASSESSMENT:   CLINICAL IMPRESSION: Pt returns for her final assess since completing radiation. She reports feeling her skin is much improved and has continued to wear her compression bra. Also she has  continued with self MLD though did not do this for past few days as she was out of town for a retreat. Her fibrosis at her medial breast is much improved as softening is noted and her peau d'orange isn't as widespread as at last session, meeting that goal. She has met all goals at this time and is ready for D/C. Ms Denk knows she is welcome to return at any time with a new doctor order prn if her cording returns or breast lymphedema worsens. She verbalized good understanding.   OBJECTIVE IMPAIRMENTS increased fascial restrictions.      PARTICIPATION LIMITATIONS: ongoing chemo    PERSONAL FACTORS Past/current experiences are also affecting patient's functional outcome.  Pt has recently completed PT for treatment of similar symptoms    REHAB POTENTIAL: Good   CLINICAL DECISION MAKING: Stable/uncomplicated   EVALUATION COMPLEXITY: Low   GOALS: Goals reviewed with patient? Yes   LONG TERM GOALS: Target date:  11/15/2021   Pt will report that symptoms of cording are decreased by 75% and that  she knows how to manage them  Baseline: Goal status: MET -09/15/21 40% better; pt did not rate today but reports though still present, this is much improved - 10/18/21; pt reports 80% improved at this time - 11/16/21   2.  Pt will have a compression sleeve to wear prophylactically for flying and at high elevations  Baseline:  Goal status: MET - 09/15/21 she has obtained a sleeve but will bring it in to be assessed; pt has received sleeve and wore this to fly recently - 10/18/21  3. Pt will demonstrate a 75% improvement in new swelling in Rt breast.    Baseline: New swelling noted with peau d'orange; currently pt is undergoing radiation so this is more widespread and has worsened due to her cancer treatment; pts breast much improved since ending radiation with fibrosis being much softer and peau d'orange not as widespread  Goal Status: ONGOING - 12/14/21; MET - 12/27/21   PLAN: PT FREQUENCY: 1-2x/week    PT DURATION: 4 weeks   PLANNED INTERVENTIONS: manual techniques, manual lymph drainage, patient  eduction, orthotic fit, therapeutic exercise    PLAN FOR NEXT SESSION:  D/C this visit; cont SOZO screens every 3 months  Berna Spare, PTA 12/27/2021, 10:04 AM    PHYSICAL THERAPY DISCHARGE SUMMARY  Visits from Start of Care: 20  Current functional level related to goals / functional outcomes: All goals met   Remaining deficits: See above   Education / Equipment: Self MLD, HEP, compression garment   Patient agrees to discharge. Patient goals were met. Patient is being discharged due to meeting the stated rehab goals.  Milagros Loll Kotlik,  12/28/21 10:08 AM

## 2021-12-28 ENCOUNTER — Inpatient Hospital Stay: Payer: BC Managed Care – PPO | Admitting: Adult Health

## 2022-01-02 ENCOUNTER — Ambulatory Visit
Admission: RE | Admit: 2022-01-02 | Discharge: 2022-01-02 | Disposition: A | Discharge status: Home / Self Care | Payer: BC Managed Care – PPO | Source: Ambulatory Visit | Attending: Radiation Oncology | Admitting: Radiation Oncology

## 2022-01-02 NOTE — Progress Notes (Signed)
  Radiation Oncology         [phone] ________________________________  Name: Natasha Ortega MRN: [id number]  Date of Service: 01/02/2022  DOB: 18-Sep-1970  Post Treatment Telephone Note  Diagnosis:  Stage IIA, [id number], grade 2, triple negative invasive ductal carcinoma of the right breast   Intent: Curative  Radiation Treatment Dates: 10/11/2021 through 11/25/2021 Site Technique Total Dose (Gy) Dose per Fx (Gy) Completed Fx Beam Energies  Breast, Right: Breast_R 3D 50.4/50.4 1.8 28/28 10X  Breast, Right: Breast_R_SCLV 3D 50.4/50.4 1.8 28/28 10X, 15X  Breast, Right: Breast_R_Bst 3D 10/10 2 5/5 6X  (as documented in provider EOT note)   The patient was not available for call today.   The patient has scheduled follow up with her medical oncologist Dr. Lindi Adie for ongoing surveillance, and was encouraged to call if she develops concerns or questions regarding radiation.    Leandra Kern, LPN

## 2022-01-06 ENCOUNTER — Inpatient Hospital Stay: Payer: BC Managed Care – PPO | Admitting: Adult Health

## 2022-01-19 ENCOUNTER — Encounter: Payer: Self-pay | Admitting: Adult Health

## 2022-01-19 ENCOUNTER — Other Ambulatory Visit: Payer: Self-pay

## 2022-01-19 ENCOUNTER — Inpatient Hospital Stay: Payer: BC Managed Care – PPO | Attending: Hematology and Oncology | Admitting: Adult Health

## 2022-01-19 VITALS — BP 130/78 | HR 86 | Temp 97.2°F | Resp 16 | Wt 191.1 lb

## 2022-01-19 DIAGNOSIS — Z803 Family history of malignant neoplasm of breast: Secondary | ICD-10-CM | POA: Insufficient documentation

## 2022-01-19 DIAGNOSIS — C50411 Malignant neoplasm of upper-outer quadrant of right female breast: Secondary | ICD-10-CM | POA: Insufficient documentation

## 2022-01-19 DIAGNOSIS — Z171 Estrogen receptor negative status [ER-]: Secondary | ICD-10-CM

## 2022-01-19 NOTE — Progress Notes (Signed)
SURVIVORSHIP VISIT:  BRIEF ONCOLOGIC HISTORY:  Oncology History  Malignant neoplasm of upper-outer quadrant of right breast in female, estrogen receptor negative ([facility])  04/21/2021 Initial Diagnosis   Screening mammogram detected right breast mass 6 mm by ultrasound, axilla negative, biopsy: Grade 2 IDC ER 0%, PR 0%, HER2 2+ by [facility]   05/11/2021 Surgery   Right lumpectomy: Grade 2 IDC 0.9 cm, margins negative, angiolymphatic invasion present, 1 lymph node with micrometastases, 0/2 other lymph nodes negative, ER 0%, PR 0%, HER2 negative by FISH Ki-67 25% (final pathology is negative for HER2)   05/11/2021 Cancer Staging   Staging form: Breast, AJCC 8th Edition - Pathologic stage from 05/11/2021: Stage IB (pT1b, pN22m, cM0, G2, ER-, PR-, HER2-) - Signed by CGardenia Phlegm NP on 08/03/2021 Stage prefix: Initial diagnosis Histologic grading system: 3 grade system   06/22/2021 - 08/26/2021 Chemotherapy   Patient is on Treatment Plan : BREAST TC q21d      Genetic Testing   [facility] CustomNext Panel was Negative. Report date is 07/05/2021.  The CustomNext-Cancer+RNAinsight panel offered by AAlthia Fortsincludes sequencing and rearrangement analysis for the following 47 genes:  APC, ATM, AXIN2, BARD1, BMPR1A, BRCA1, BRCA2, BRIP1, CDH1, CDK4, CDKN2A, CHEK2, CTNNA1, DICER1, EPCAM, GREM1, HOXB13, KIT, MEN1, MLH1, MSH2, MSH3, MSH6, MUTYH, NBN, NF1, NTHL1, PALB2, PDGFRA, PMS2, POLD1, POLE, PTEN, RAD50, RAD51C, RAD51D, SDHA, SDHB, SDHC, SDHD, SMAD4, SMARCA4, STK11, TP53, TSC1, TSC2, and VHL.  RNA data is routinely analyzed for use in variant interpretation for all genes.   10/12/2021 - 11/25/2021 Radiation Therapy   Site Technique Total Dose (Gy) Dose per Fx (Gy) Completed Fx Beam Energies  Breast, Right: Breast_R 3D 50.4/50.4 1.8 28/28 10X  Breast, Right: Breast_R_SCLV 3D 50.4/50.4 1.8 28/28 10X, 15X  Breast, Right: Breast_R_Bst 3D 10/10 2 5/5 6X       INTERVAL HISTORY:  [name] review her  survivorship care plan detailing her treatment course for breast cancer, as well as monitoring long-term side effects of that treatment, education regarding health maintenance, screening, and overall wellness and health promotion.     Overall, Ms. HZawistowskireports feeling moderately well.    REVIEW OF SYSTEMS:  Review of Systems  Constitutional:  Negative for appetite change, chills, fatigue, fever and unexpected weight change.  HENT:   Negative for hearing loss, lump/mass and trouble swallowing.   Eyes:  Negative for eye problems and icterus.  Respiratory:  Negative for chest tightness, cough and shortness of breath.   Cardiovascular:  Negative for chest pain, leg swelling and palpitations.  Gastrointestinal:  Negative for abdominal distention, abdominal pain, constipation, diarrhea, nausea and vomiting.  Endocrine: Negative for hot flashes.  Genitourinary:  Negative for difficulty urinating.   Musculoskeletal:  Negative for arthralgias.  Skin:  Negative for itching and rash.  Neurological:  Negative for dizziness, extremity weakness, headaches and numbness.  Hematological:  Negative for adenopathy. Does not bruise/bleed easily.  Psychiatric/Behavioral:  Negative for depression. The patient is not nervous/anxious.   Breast: Denies any new nodularity, masses, tenderness, nipple changes, or nipple discharge.     PAST MEDICAL/SURGICAL HISTORY:  Past Medical History:  Diagnosis Date   Cancer ([place] 04/21/2021   right breast IDC   Headache    migraines   Port-A-Cath in place 06/21/2021   Past Surgical History:  Procedure Laterality Date   BREAST LUMPECTOMY WITH RADIOACTIVE SEED AND SENTINEL LYMPH NODE BIOPSY Right 05/11/2021   Procedure: RIGHT BREAST LUMPECTOMY WITH RADIOACTIVE SEED AND SENTINEL LYMPH NODE BIOPSY;  Surgeon: [name], MD;  Location: [place];  Service: General;  Laterality: Right;   PORT-A-CATH REMOVAL Left 09/13/2021   Procedure: REMOVAL PORT-A-CATH;  Surgeon:  [name], MD;  Location: [place];  Service: General;  Laterality: Left;   PORTACATH PLACEMENT N/A 06/09/2021   Procedure: INSERTION PORT-A-CATH;  Surgeon: [name], MD;  Location: [place];  Service: General;  Laterality: N/A;     ALLERGIES:  Allergies  Allergen Reactions   Penicillins Rash    Childhood     CURRENT MEDICATIONS:  Outpatient Encounter Medications as of 01/19/2022  Medication Sig   ibuprofen (ADVIL) 200 MG tablet Take by mouth.   No facility-administered encounter medications on file as of 01/19/2022.     ONCOLOGIC FAMILY HISTORY:  Family History  Problem Relation Age of Onset   Breast cancer Maternal Grandmother 46       dx. again at 88, unsure if recurrence or second primary   Prostate cancer Paternal Grandfather    Breast cancer Other    Brain cancer Other       SOCIAL HISTORY:  Social History   Socioeconomic History   Marital status: Married    Spouse name: Not on file   Number of children: Not on file   Years of education: Not on file   Highest education level: Not on file  Occupational History   Not on file  Tobacco Use   Smoking status: Never   Smokeless tobacco: Never  Vaping Use   Vaping Use: Never used  Substance and Sexual Activity   Alcohol use: Yes    Comment: rarely   Drug use: Never   Sexual activity: Yes    Partners: Male    Birth control/protection: Post-menopausal, None    Comment: due to chemo  Other Topics Concern   Not on file  Social History Narrative   Not on file   Social Determinants of Health   Financial Resource Strain: Not on file  Food Insecurity: Not on file  Transportation Needs: Not on file  Physical Activity: Not on file  Stress: Not on file  Social Connections: Not on file  Intimate Partner Violence: Not on file     OBSERVATIONS/OBJECTIVE:  BP 130/78 (BP Location: Right Arm, Patient Position: Sitting)   Pulse 86   Temp (!) 97.2 F (36.2 C)  (Temporal)   Resp 16   Wt 191 lb 1 oz (86.7 kg)   LMP  (LMP Unknown) Comment: last summer was last cycle  SpO2 97%   BMI 32.29 kg/m  GENERAL: Patient is a well appearing female in no acute distress HEENT:  Sclerae anicteric.  Oropharynx clear and moist. No ulcerations or evidence of oropharyngeal candidiasis. Neck is supple.  NODES:  No cervical, supraclavicular, or axillary lymphadenopathy palpated.  BREAST EXAM: Right breast status postlumpectomy and radiation, no sign of local recurrence, left breast is benign LUNGS:  Clear to auscultation bilaterally.  No wheezes or rhonchi. HEART:  Regular rate and rhythm. No murmur appreciated. ABDOMEN:  Soft, nontender.  Positive, normoactive bowel sounds. No organomegaly palpated. MSK:  No focal spinal tenderness to palpation. Full range of motion bilaterally in the upper extremities. EXTREMITIES:  No peripheral edema.   SKIN:  Clear with no obvious rashes or skin changes. No nail dyscrasia. NEURO:  Nonfocal. Well oriented.  Appropriate affect.  LABORATORY DATA:  None for this visit.  DIAGNOSTIC IMAGING:  None for this visit.      ASSESSMENT AND PLAN:  Ms.. Natasha Ortega is a pleasant 51 y.o. female with Stage IB right breast invasive ductal carcinoma, ER-/PR-/HER2-, diagnosed in 04/2021, treated with lumpectomy, adjuvant chemotherapy, and adjuvant radiation therapy.  She presents to the Survivorship Clinic for our initial meeting and routine follow-up post-completion of treatment for breast cancer.    1. Stage IB right breast cancer:  Ms. Flanery is continuing to recover from definitive treatment for breast cancer. She will follow-up with her medical oncologist, Dr. Lindi Adie in 6 months with history and physical exam per surveillance protocol.  Her mammogram is due 04/2022; orders placed today.  I placed orders for Signatera testing and gave her information on this.  Today, a comprehensive survivorship care plan and treatment summary was reviewed with the  patient today detailing her breast cancer diagnosis, treatment course, potential late/long-term effects of treatment, appropriate follow-up care with recommendations for the future, and patient education resources.  A copy of this summary, along with a letter will be sent to the patient's primary care provider via mail/fax/In Basket message after today's visit.    2. Bone health:   She was given education on specific activities to promote bone health.  3. Cancer screening:  Due to Ms. Been's history and her age, she should receive screening for skin cancers, colon cancer, and gynecologic cancers.  The information and recommendations are listed on the patient's comprehensive care plan/treatment summary and were reviewed in detail with the patient.    4. Health maintenance and wellness promotion: Ms. Schrade was encouraged to consume 5-7 servings of fruits and vegetables per day. We reviewed the "Nutrition Rainbow" handout in detail.  She was also encouraged to engage in moderate to vigorous exercise for 30 minutes per day most days of the week. We discussed the [employer] fitness program, which is designed for cancer survivors to help them become more physically fit after cancer treatments.  She was instructed to limit her alcohol consumption and continue to abstain from tobacco use.  We spent additional time discussing vitamins, supplements, and the [facility] about herbs website.    5. Support services/counseling: It is not uncommon for this period of the patient's cancer care trajectory to be one of many emotions and stressors.  She was given information regarding our available services and encouraged to contact me with any questions or for help enrolling in any of our support group/programs.    Follow up instructions:    -Return to cancer center in 6 months for f/u with Dr. Lindi Adie  -Mammogram due in 04/2022 -She is welcome to return back to the Survivorship Clinic at any time; no additional  follow-up needed at this time.  -Consider referral back to survivorship as a long-term survivor for continued surveillance  The patient was provided an opportunity to ask questions and all were answered. The patient agreed with the plan and demonstrated an understanding of the instructions.   Total encounter time:60 minutes*in face-to-face visit time, chart review, lab review, care coordination, order entry, and documentation of the encounter time.    Wilber Bihari, NP 01/19/22 11:43 AM Medical Oncology and Hematology [facility] [place], [place] [address] Tel. [phone]    Fax. [phone]  *Total Encounter Time as defined by the Centers for Medicare and Medicaid Services includes, in addition to the face-to-face time of a patient visit (documented in the note above) non-face-to-face time: obtaining and reviewing outside history, ordering and reviewing medications, tests or procedures, care coordination (communications with other health care professionals or  caregivers) and documentation in the medical record.

## 2022-01-20 ENCOUNTER — Inpatient Hospital Stay: Payer: BC Managed Care – PPO | Admitting: Adult Health

## 2022-02-19 LAB — SIGNATERA ONLY (NATERA MANAGED)
SIGNATERA MTM READOUT: 0 MTM/ml
SIGNATERA TEST RESULT: NEGATIVE

## 2022-02-22 ENCOUNTER — Encounter (HOSPITAL_COMMUNITY): Payer: Self-pay

## 2022-04-03 ENCOUNTER — Ambulatory Visit: Payer: BC Managed Care – PPO | Attending: Hematology and Oncology

## 2022-04-03 VITALS — Wt 182.2 lb

## 2022-04-03 DIAGNOSIS — Z483 Aftercare following surgery for neoplasm: Secondary | ICD-10-CM | POA: Insufficient documentation

## 2022-04-03 NOTE — Therapy (Signed)
  OUTPATIENT PHYSICAL THERAPY SOZO SCREENING NOTE   Patient Name: Natasha Ortega MRN: [id number] DOB:[birth year], 52 y.o., female Today's Date: 04/03/2022  PCP: Louretta Shorten, MD REFERRING PROVIDER: Nicholas Lose, MD   PT End of Session - 04/03/22 1619     Visit Number 20   # unchanged due to screen only   PT Start Time 1617    PT Stop Time 1622    PT Time Calculation (min) 5 min    Activity Tolerance Patient tolerated treatment well    Behavior During Therapy [facility] for tasks assessed/performed             Past Medical History:  Diagnosis Date   Cancer ([place]) 04/21/2021   right breast IDC   Headache    migraines   Port-A-Cath in place 06/21/2021   Past Surgical History:  Procedure Laterality Date   BREAST LUMPECTOMY WITH RADIOACTIVE SEED AND SENTINEL LYMPH NODE BIOPSY Right 05/11/2021   Procedure: RIGHT BREAST LUMPECTOMY WITH RADIOACTIVE SEED AND SENTINEL LYMPH NODE BIOPSY;  Surgeon: [name], MD;  Location: [place];  Service: General;  Laterality: Right;   PORT-A-CATH REMOVAL Left 09/13/2021   Procedure: REMOVAL PORT-A-CATH;  Surgeon: [name], MD;  Location: [place];  Service: General;  Laterality: Left;   PORTACATH PLACEMENT N/A 06/09/2021   Procedure: INSERTION PORT-A-CATH;  Surgeon: [name], MD;  Location: [place];  Service: General;  Laterality: N/A;   Patient Active Problem List   Diagnosis Date Noted   Genetic testing 05/09/2021   Family history of breast cancer 05/05/2021   Malignant neoplasm of upper-outer quadrant of right breast in female, estrogen receptor negative ([place]) 04/28/2021    REFERRING DIAG: right breast cancer at risk for lymphedema  THERAPY DIAG: Aftercare following surgery for neoplasm  PERTINENT HISTORY:  Patient was diagnosed on 04/28/2021 with right grade 2 invasive ductal carcinoma breast cancer. She underwent a right lumpectomy and sentinel node biopsy on 05/11/2021. She had 1 node with  micromets and 2 negative nodes (3 total) removed. It is ER/PR negative and HER2 negative with a Ki67 of 40%.    PRECAUTIONS: right UE Lymphedema risk, None  SUBJECTIVE: Pt returns for her 3 month L-Dex screen.   PAIN:  Are you having pain? No  SOZO SCREENING: Patient was assessed today using the SOZO machine to determine the lymphedema index score. This was compared to her baseline score. It was determined that she is within the recommended range when compared to her baseline and no further action is needed at this time. She will continue SOZO screenings. These are done every 3 months for 2 years post operatively followed by every 6 months for 2 years, and then annually.   L-DEX FLOWSHEETS - 04/03/22 1600       L-DEX LYMPHEDEMA SCREENING   Measurement Type Unilateral    L-DEX MEASUREMENT EXTREMITY Upper Extremity    POSITION  Standing    DOMINANT SIDE Right    At Risk Side Right    BASELINE SCORE (UNILATERAL) -5.6    L-DEX SCORE (UNILATERAL) -6.3    VALUE CHANGE (UNILAT) -0.7               Otelia Limes, PTA 04/03/2022, 4:22 PM

## 2022-04-05 ENCOUNTER — Telehealth: Payer: Self-pay

## 2022-04-05 NOTE — Telephone Encounter (Signed)
Called pt per MD to advise Signatera testing was negative/not detected. Pt verbalized understanding of results and knows [employer] will be in touch to schedule 3 mo repeat lab.   

## 2022-04-06 ENCOUNTER — Ambulatory Visit
Admission: RE | Admit: 2022-04-06 | Discharge: 2022-04-06 | Disposition: A | Discharge status: Home / Self Care | Payer: BC Managed Care – PPO | Source: Ambulatory Visit | Attending: Adult Health | Admitting: Adult Health

## 2022-04-06 DIAGNOSIS — Z171 Estrogen receptor negative status [ER-]: Secondary | ICD-10-CM

## 2022-04-06 HISTORY — DX: Personal history of irradiation: Z92.3

## 2022-04-06 HISTORY — DX: Personal history of antineoplastic chemotherapy: Z92.21

## 2022-05-08 ENCOUNTER — Telehealth: Payer: Self-pay

## 2022-05-08 NOTE — Telephone Encounter (Signed)
Pt called back and was given signatera results. She verbalized thanks and understanding.

## 2022-05-08 NOTE — Telephone Encounter (Signed)
Attempted to call pt to give signatera results (negative). LVM for call back to give results.

## 2022-05-11 ENCOUNTER — Encounter: Payer: Self-pay | Admitting: Adult Health

## 2022-06-22 LAB — SIGNATERA
SIGNATERA MTM READOUT: 0 MTM/ml
SIGNATERA TEST RESULT: NEGATIVE

## 2022-06-23 ENCOUNTER — Telehealth: Payer: Self-pay

## 2022-06-23 NOTE — Telephone Encounter (Signed)
Called pt per MD to advise Signatera testing was negative/not detected. Pt verbalized understanding of results and knows [employer] will be in touch to schedule 3 mo repeat lab.   

## 2022-06-26 ENCOUNTER — Other Ambulatory Visit: Payer: Self-pay | Admitting: Adult Health

## 2022-06-26 DIAGNOSIS — I890 Lymphedema, not elsewhere classified: Secondary | ICD-10-CM

## 2022-06-26 NOTE — Progress Notes (Signed)
Per Dwaine Gale, PT patient wants another referral for breast lymphedema.  I placed orders for this today.  Lillard Anes, NP 06/26/22 3:22 PM Medical Oncology and Hematology [facility] [address] [place], [place] [address] Tel. [phone]    Fax. [phone]

## 2022-06-26 NOTE — Therapy (Signed)
OUTPATIENT PHYSICAL THERAPY  UPPER EXTREMITY ONCOLOGY EVALUATION  Patient Name: Natasha Ortega MRN: [id number] DOB:29-Jul-1970, 52 y.o., female Today's Date: 06/27/2022  END OF SESSION:  PT End of Session - 06/27/22 0905     Visit Number 1    Number of Visits 12    Date for PT Re-Evaluation 08/08/22    PT Start Time 0907    PT Stop Time 0953    PT Time Calculation (min) 46 min    Activity Tolerance Patient tolerated treatment well    Behavior During Therapy [facility] for tasks assessed/performed             Past Medical History:  Diagnosis Date   Cancer ([facility]) 04/21/2021   right breast IDC   Headache    migraines   Personal history of chemotherapy    Personal history of radiation therapy    Port-A-Cath in place 06/21/2021   Past Surgical History:  Procedure Laterality Date   BREAST LUMPECTOMY Right 05/11/2021   BREAST LUMPECTOMY WITH RADIOACTIVE SEED AND SENTINEL LYMPH NODE BIOPSY Right 05/11/2021   Procedure: RIGHT BREAST LUMPECTOMY WITH RADIOACTIVE SEED AND SENTINEL LYMPH NODE BIOPSY;  Surgeon: Abigail Miyamoto, MD;  [facility];  Service: General;  Laterality: Right;   PORT-A-CATH REMOVAL Left 09/13/2021   Procedure: REMOVAL PORT-A-CATH;  Surgeon: Abigail Miyamoto, MD;  Location: [facility];  Service: General;  Laterality: Left;   PORTACATH PLACEMENT N/A 06/09/2021   Procedure: INSERTION PORT-A-CATH;  Surgeon: Abigail Miyamoto, MD;  Location: [facility];  Service: General;  Laterality: N/A;   Patient Active Problem List   Diagnosis Date Noted   Genetic testing 05/09/2021   Family history of breast cancer 05/05/2021   Malignant neoplasm of upper-outer quadrant of right breast in female, estrogen receptor negative ([facility]) 04/28/2021     REFERRING PROVIDER: Lillard Anes, NP  REFERRING DIAG: Left breast swelling  THERAPY DIAG:  Malignant neoplasm of upper-outer quadrant of right breast in female, estrogen receptor negative  ([facility])  Lymphedema, not elsewhere classified  Aftercare following surgery for neoplasm  ONSET DATE: 05/2021  Rationale for Evaluation and Treatment: Rehabilitation  SUBJECTIVE:                                                                                                                                                                                           SUBJECTIVE STATEMENT:  Pt with ongoing complaints of right breast swelling with p'eau d'orange and also complaints of cording. Pt was previously seen in PT, with last regualr treatment 11/16/21/ Swelling is in lower quadrant and never really left. The chip pack helps some and leaves  indentions. I use gray foam also to pad the medial breast and soften it.  I can feel some cording in the axillary region at the incision. I don't feel like ROM is bothered at all. It gets tender if I don't keep working on it. It gets hard and that bothers me. The peau d'orange looks bad. I have the hugger bra's that I wear most days, and I have some sports bras too PERTINENT HISTORY:   Patient was diagnosed on 04/28/2021 with right grade 2 invasive ductal carcinoma breast cancer. She underwent a right lumpectomy and sentinel node biopsy on 05/11/2021. She had 1 node with micromets and 2 negative nodes (3 total) removed. It is ER/PR negative and HER2 negative with a Ki67 of 40%   PAIN:  Are you having pain? Yes NPRS scale: 3- 6/10 Pain location: right axilla Pain orientation: Right  PAIN TYPE: aching, tender Pain description: intermittent  Aggravating factors: laying on right side,tender to touch Relieving factors: MLD softens.  PRECAUTIONS: Left UE lymphedema  WEIGHT BEARING RESTRICTIONS: No  FALLS:  Has patient fallen in last 6 months? No  LIVING ENVIRONMENT: Lives with: lives with their family, husband, daughter and mother in law Lives in: House/apartment Stairs: Yes; Internal: 10 steps; on left going up and External: 3 steps; none Has following  equipment at home: None  OCCUPATION: accountant  LEISURE: nothing  HAND DOMINANCE: right   PRIOR LEVEL OF FUNCTION: Independent  PATIENT GOALS: Peau'dorange to go away, swelling go away and cording resolve   OBJECTIVE:  COGNITION: Overall cognitive status: Within functional limits for tasks assessed   PALPATION: Tender right medial breast with area of fibrosis, cording noted in axilla and upper arm not interfering with motion, cord also noted at incision area with small nodularity and running upward andtransversely  OBSERVATIONS / OTHER ASSESSMENTS: enlarged pores medial and inferior right breast  SENSATION: Light touch: Appears intact   POSTURE: forward head, rounded shouldes  UPPER EXTREMITY AROM/PROM:  A/PROM RIGHT   eval   Shoulder extension   Shoulder flexion 160  Shoulder abduction 180  Shoulder internal rotation   Shoulder external rotation     (Blank rows = not tested)  A/PROM LEFT   eval  Shoulder extension   Shoulder flexion 166  Shoulder abduction 180  Shoulder internal rotation   Shoulder external rotation     (Blank rows = not tested)  CERVICAL AROM: All within functional limits:      UPPER EXTREMITY STRENGTH:   LYMPHEDEMA ASSESSMENTS:   SURGERY TYPE/DATE: 05/11/2021 Right lumpectomy with SLNB  NUMBER OF LYMPH NODES REMOVED: 1/3  CHEMOTHERAPY: YES  RADIATION:YES  HORMONE TREATMENT: NO  INFECTIONS: NO   LYMPHEDEMA ASSESSMENTS:   LANDMARK RIGHT  eval  At axilla    15 cm proximal to olecranon process   10 cm proximal to olecranon process   Olecranon process   15 cm proximal to ulnar styloid process   10 cm proximal to ulnar styloid process   Just proximal to ulnar styloid process   Across hand at thumb web space   At base of 2nd digit   (Blank rows = not tested)  LANDMARK LEFT  eval  At axilla    15 cm proximal to olecranon process   10 cm proximal to olecranon process   Olecranon process   15 cm proximal to ulnar  styloid process   10 cm proximal to ulnar styloid process   Just proximal to ulnar styloid process   Across hand  at thumb web space   At base of 2nd digit   (Blank rows = not tested)  L-DEX LYMPHEDEMA SCREENING: The patient was assessed using the L-Dex machine today to produce a lymphedema index baseline score. The patient will be reassessed on a regular basis (typically every 3 months) to obtain new L-Dex scores. If the score is > 6.5 points away from his/her baseline score indicating onset of subclinical lymphedema, it will be recommended to wear a compression garment for 4 weeks, 12 hours per day and then be reassessed. If the score continues to be > 6.5 points from baseline at reassessment, we will initiate lymphedema treatment. Assessing in this manner has a 95% rate of preventing clinically significant lymphedema.   BREAST COMPLAINTS QUESTIONNAIRE Pain:6 Heaviness:6 Swollen feeling:8 Tense Skin:1 Redness:2 Bra Print:0 Size of Pores:10 Hard feeling: 9 Total:   42  /80 A Score over 9 indicates lymphedema issues in the breast   TODAY'S TREATMENT:                                                                                                                                         06/27/2022 No treatment due to lack of time  PATIENT EDUCATION:  Education details: POC, discussion of lymphedema, compression pumps Person educated: Patient Education method: Explanation Education comprehension: verbalized understanding  HOME EXERCISE PROGRAM: NA  ASSESSMENT:  CLINICAL IMPRESSION: Patient is a 52 y.o. female who was seen today for physical therapy evaluation and treatment for complaints of Right breast swelling and cording in the axillary region and incision area. She presents with enlarged pore size, fibrosis at medial breast, and peau d'orange at inferior breast. She continues to be bothered by several short  cords near the incisions running vertically and horizontally. She has  cording from axilla into arm as well but is not really limited by these. She will benefit from skilled PT to address deficits and return to PLOF.   OBJECTIVE IMPAIRMENTS: decreased knowledge of condition, increased edema, increased fascial restrictions, and pain.   ACTIVITY LIMITATIONS: sleeping, able to do most but with increased swelling in breast  PARTICIPATION LIMITATIONS:  doing all  PERSONAL FACTORS: 1-2 comorbidities: Right breast cancer with chemo and radiation  are also affecting patient's functional outcome.   REHAB POTENTIAL: Good  CLINICAL DECISION MAKING: Stable/uncomplicated  EVALUATION COMPLEXITY: Low  GOALS: Goals reviewed with patient? Yes  SHORT TERM GOALS= LTG: Target date: 08/08/2022  Pt will have decreased discomfort from cording in Right axilla/chest region by 50% Baseline: Goal status: INITIAL  2.  Pt will be independent with MLD to the right breast to decrease edema Baseline:  Goal status: INITIAL  3.  Pt will note improvements in pore size and peau'd'orange with compression and manual techniques Baseline:  Goal status: INITIAL  4.  Pt will  have Flexi touch trial as indicated for right breast swelling Baseline:  Goal status: INITIAL  PLAN:  PT FREQUENCY: 2x/week  PT DURATION: 6 weeks  PLANNED INTERVENTIONS: Therapeutic exercises, Neuromuscular re-education, Balance training, Gait training, Self Care, Orthotic/Fit training, Manual lymph drainage, scar mobilization, Taping, Vasopneumatic device, Manual therapy, and Re-evaluation  PLAN FOR NEXT SESSION: taping?, Flexitouch ;demo sent 06/27/2022, MLD to breast, cording release,  Waynette Buttery, PT 06/27/2022, 11:57 AM

## 2022-06-27 ENCOUNTER — Ambulatory Visit: Payer: BC Managed Care – PPO | Attending: Adult Health

## 2022-06-27 ENCOUNTER — Other Ambulatory Visit: Payer: Self-pay

## 2022-06-27 DIAGNOSIS — Z483 Aftercare following surgery for neoplasm: Secondary | ICD-10-CM | POA: Insufficient documentation

## 2022-06-27 DIAGNOSIS — C50411 Malignant neoplasm of upper-outer quadrant of right female breast: Secondary | ICD-10-CM | POA: Insufficient documentation

## 2022-06-27 DIAGNOSIS — Z171 Estrogen receptor negative status [ER-]: Secondary | ICD-10-CM

## 2022-06-27 DIAGNOSIS — R293 Abnormal posture: Secondary | ICD-10-CM | POA: Insufficient documentation

## 2022-06-27 DIAGNOSIS — I890 Lymphedema, not elsewhere classified: Secondary | ICD-10-CM | POA: Diagnosis present

## 2022-06-29 ENCOUNTER — Ambulatory Visit: Payer: BC Managed Care – PPO

## 2022-06-29 DIAGNOSIS — Z483 Aftercare following surgery for neoplasm: Secondary | ICD-10-CM

## 2022-06-29 DIAGNOSIS — I890 Lymphedema, not elsewhere classified: Secondary | ICD-10-CM

## 2022-06-29 DIAGNOSIS — R293 Abnormal posture: Secondary | ICD-10-CM

## 2022-06-29 DIAGNOSIS — Z171 Estrogen receptor negative status [ER-]: Secondary | ICD-10-CM

## 2022-06-29 DIAGNOSIS — C50411 Malignant neoplasm of upper-outer quadrant of right female breast: Secondary | ICD-10-CM | POA: Diagnosis not present

## 2022-06-29 NOTE — Therapy (Signed)
OUTPATIENT PHYSICAL THERAPY  UPPER EXTREMITY ONCOLOGY TREATMENT  Patient Name: Natasha Ortega MRN: [id number] DOB:1970-07-19, 52 y.o., female Today's Date: 06/29/2022  END OF SESSION:  PT End of Session - 06/29/22 1212     Visit Number 2    Number of Visits 12    Date for PT Re-Evaluation 08/08/22    PT Start Time 1206    PT Stop Time 1307    PT Time Calculation (min) 61 min    Activity Tolerance Patient tolerated treatment well    Behavior During Therapy [facility] for tasks assessed/performed             Past Medical History:  Diagnosis Date   Cancer (HCC) 04/21/2021   right breast IDC   Headache    migraines   Personal history of chemotherapy    Personal history of radiation therapy    Port-A-Cath in place 06/21/2021   Past Surgical History:  Procedure Laterality Date   BREAST LUMPECTOMY Right 05/11/2021   BREAST LUMPECTOMY WITH RADIOACTIVE SEED AND SENTINEL LYMPH NODE BIOPSY Right 05/11/2021   Procedure: RIGHT BREAST LUMPECTOMY WITH RADIOACTIVE SEED AND SENTINEL LYMPH NODE BIOPSY;  Surgeon: Abigail Miyamoto, MD;  Location: [facility] OR;  Service: General;  Laterality: Right;   PORT-A-CATH REMOVAL Left 09/13/2021   Procedure: REMOVAL PORT-A-CATH;  Surgeon: Abigail Miyamoto, MD;  Location: [facility];  Service: General;  Laterality: Left;   PORTACATH PLACEMENT N/A 06/09/2021   Procedure: INSERTION PORT-A-CATH;  Surgeon: Abigail Miyamoto, MD;  Location: [facility];  Service: General;  Laterality: N/A;   Patient Active Problem List   Diagnosis Date Noted   Genetic testing 05/09/2021   Family history of breast cancer 05/05/2021   Malignant neoplasm of upper-outer quadrant of right breast in female, estrogen receptor negative (HCC) 04/28/2021     REFERRING PROVIDER: Lillard Anes, NP  REFERRING DIAG: Left breast swelling  THERAPY DIAG:  Malignant neoplasm of upper-outer quadrant of right breast in female, estrogen receptor negative  (HCC)  Lymphedema, not elsewhere classified  Aftercare following surgery for neoplasm  Abnormal posture  ONSET DATE: 05/2021  Rationale for Evaluation and Treatment: Rehabilitation  SUBJECTIVE:                                                                                                                                                                                           SUBJECTIVE STATEMENT:  I have another bra that is semi compressive that I tried wearing yesterday with the chip pack and my breast feels so much better today! I feel like I was able to get better compression to that area with that.  PERTINENT HISTORY:   Patient was diagnosed on 04/28/2021 with right grade 2 invasive ductal carcinoma breast cancer. She underwent a right lumpectomy and sentinel node biopsy on 05/11/2021. She had 1 node with micromets and 2 negative nodes (3 total) removed. It is ER/PR negative and HER2 negative with a Ki67 of 40%   PAIN:  Are you having pain? Yes NPRS scale: 0/10, just tender inferior to incision   PRECAUTIONS: Left UE lymphedema  WEIGHT BEARING RESTRICTIONS: No  FALLS:  Has patient fallen in last 6 months? No  LIVING ENVIRONMENT: Lives with: lives with their family, husband, daughter and mother in law Lives in: House/apartment Stairs: Yes; Internal: 10 steps; on left going up and External: 3 steps; none Has following equipment at home: None  OCCUPATION: accountant  LEISURE: nothing  HAND DOMINANCE: right   PRIOR LEVEL OF FUNCTION: Independent  PATIENT GOALS: Peau'dorange to go away, swelling go away and cording resolve   OBJECTIVE:  COGNITION: Overall cognitive status: Within functional limits for tasks assessed   PALPATION: Tender right medial breast with area of fibrosis, cording noted in axilla and upper arm not interfering with motion, cord also noted at incision area with small nodularity and running upward andtransversely  OBSERVATIONS / OTHER  ASSESSMENTS: enlarged pores medial and inferior right breast  SENSATION: Light touch: Appears intact   POSTURE: forward head, rounded shouldes  UPPER EXTREMITY AROM/PROM:  A/PROM RIGHT   eval   Shoulder extension   Shoulder flexion 160  Shoulder abduction 180  Shoulder internal rotation   Shoulder external rotation     (Blank rows = not tested)  A/PROM LEFT   eval  Shoulder extension   Shoulder flexion 166  Shoulder abduction 180  Shoulder internal rotation   Shoulder external rotation     (Blank rows = not tested)  CERVICAL AROM: All within functional limits:      UPPER EXTREMITY STRENGTH:   LYMPHEDEMA ASSESSMENTS:   SURGERY TYPE/DATE: 05/11/2021 Right lumpectomy with SLNB  NUMBER OF LYMPH NODES REMOVED: 1/3  CHEMOTHERAPY: YES  RADIATION:YES  HORMONE TREATMENT: NO  INFECTIONS: NO   LYMPHEDEMA ASSESSMENTS:   LANDMARK RIGHT  eval  At axilla    15 cm proximal to olecranon process   10 cm proximal to olecranon process   Olecranon process   15 cm proximal to ulnar styloid process   10 cm proximal to ulnar styloid process   Just proximal to ulnar styloid process   Across hand at thumb web space   At base of 2nd digit   (Blank rows = not tested)  LANDMARK LEFT  eval  At axilla    15 cm proximal to olecranon process   10 cm proximal to olecranon process   Olecranon process   15 cm proximal to ulnar styloid process   10 cm proximal to ulnar styloid process   Just proximal to ulnar styloid process   Across hand at thumb web space   At base of 2nd digit   (Blank rows = not tested)  L-DEX LYMPHEDEMA SCREENING: The patient was assessed using the L-Dex machine today to produce a lymphedema index baseline score. The patient will be reassessed on a regular basis (typically every 3 months) to obtain new L-Dex scores. If the score is > 6.5 points away from his/her baseline score indicating onset of subclinical lymphedema, it will be recommended to wear a  compression garment for 4 weeks, 12 hours per day and then be reassessed. If the score continues to  be > 6.5 points from baseline at reassessment, we will initiate lymphedema treatment. Assessing in this manner has a 95% rate of preventing clinically significant lymphedema.   BREAST COMPLAINTS QUESTIONNAIRE Pain:6 Heaviness:6 Swollen feeling:8 Tense Skin:1 Redness:2 Bra Print:0 Size of Pores:10 Hard feeling: 9 Total:   42  /80 A Score over 9 indicates lymphedema issues in the breast   TODAY'S TREATMENT:                                                                                                                                         06/29/22: Manual Therapy MLD to Rt breast in supine: Short neck, superficial and deep abdominals, Rt inguinal and Lt axillary nodes, Rt axillo-inguinal anastomosis, Lt anterior intact upper quadrant sequence, then focused on Rt breast reviewing sequence with pt.  MFR to cording palpable in medial upper arm, but mildly so STM to Rt lateral trunk where pt palpably tight and tender with Rt UE resting on pillow Trial of kinesiotape "I" band over incision where pt feels area of fullness and peau d'orange present. Pt educated how to safely remove to protect skin.   06/27/2022 No treatment due to lack of time  PATIENT EDUCATION:  Education details: POC, discussion of lymphedema, compression pumps Person educated: Patient Education method: Explanation Education comprehension: verbalized understanding  HOME EXERCISE PROGRAM: NA  ASSESSMENT:  CLINICAL IMPRESSION: Resumed MLD to Rt breast reviewing sequence with pt. She had gotten confused on which quadrant to redirect fluid and was stimulating Lt axilla and Lt inguinal nodes along with Rt axillary nodes. So reviewed correct Lt axilla and Rt inguinal nodes/quadrants. Also trial of kinesiotape over Rt incision where edema present.    OBJECTIVE IMPAIRMENTS: decreased knowledge of condition, increased edema,  increased fascial restrictions, and pain.   ACTIVITY LIMITATIONS: sleeping, able to do most but with increased swelling in breast  PARTICIPATION LIMITATIONS:  doing all  PERSONAL FACTORS: 1-2 comorbidities: Right breast cancer with chemo and radiation  are also affecting patient's functional outcome.   REHAB POTENTIAL: Good  CLINICAL DECISION MAKING: Stable/uncomplicated  EVALUATION COMPLEXITY: Low  GOALS: Goals reviewed with patient? Yes  SHORT TERM GOALS= LTG: Target date: 08/08/2022  Pt will have decreased discomfort from cording in Right axilla/chest region by 50% Baseline: Goal status: INITIAL  2.  Pt will be independent with MLD to the right breast to decrease edema Baseline:  Goal status: INITIAL  3.  Pt will note improvements in pore size and peau'd'orange with compression and manual techniques Baseline:  Goal status: INITIAL  4.  Pt will  have Flexi touch trial as indicated for right breast swelling Baseline:  Goal status: INITIAL    PLAN:  PT FREQUENCY: 2x/week  PT DURATION: 6 weeks  PLANNED INTERVENTIONS: Therapeutic exercises, Neuromuscular re-education, Balance training, Gait training, Self Care, Orthotic/Fit training, Manual lymph drainage, scar mobilization, Taping, Vasopneumatic device, Manual therapy, and Re-evaluation  PLAN  FOR NEXT SESSION: How was kineiotape?  Flexitouch = demo sent 06/27/2022, MLD to breast, cording release  Hermenia Bers, PTA 06/29/2022, 1:18 PM

## 2022-07-03 ENCOUNTER — Ambulatory Visit: Payer: BC Managed Care – PPO

## 2022-07-04 ENCOUNTER — Ambulatory Visit: Payer: BC Managed Care – PPO

## 2022-07-04 DIAGNOSIS — I890 Lymphedema, not elsewhere classified: Secondary | ICD-10-CM

## 2022-07-04 DIAGNOSIS — Z483 Aftercare following surgery for neoplasm: Secondary | ICD-10-CM

## 2022-07-04 DIAGNOSIS — Z171 Estrogen receptor negative status [ER-]: Secondary | ICD-10-CM

## 2022-07-04 DIAGNOSIS — R293 Abnormal posture: Secondary | ICD-10-CM

## 2022-07-04 DIAGNOSIS — C50411 Malignant neoplasm of upper-outer quadrant of right female breast: Secondary | ICD-10-CM | POA: Diagnosis not present

## 2022-07-04 NOTE — Therapy (Signed)
OUTPATIENT PHYSICAL THERAPY  UPPER EXTREMITY ONCOLOGY TREATMENT  Patient Name: Natasha Ortega MRN: [id number] DOB:1970/06/14, 52 y.o., female Today's Date: 07/04/2022  END OF SESSION:  PT End of Session - 07/04/22 1207     Visit Number 3    Number of Visits 12    Date for PT Re-Evaluation 08/08/22    PT Start Time 1204    PT Stop Time 1258    PT Time Calculation (min) 54 min    Activity Tolerance Patient tolerated treatment well    Behavior During Therapy [facility] for tasks assessed/performed             Past Medical History:  Diagnosis Date   Cancer (HCC) 04/21/2021   right breast IDC   Headache    migraines   Personal history of chemotherapy    Personal history of radiation therapy    Port-A-Cath in place 06/21/2021   Past Surgical History:  Procedure Laterality Date   BREAST LUMPECTOMY Right 05/11/2021   BREAST LUMPECTOMY WITH RADIOACTIVE SEED AND SENTINEL LYMPH NODE BIOPSY Right 05/11/2021   Procedure: RIGHT BREAST LUMPECTOMY WITH RADIOACTIVE SEED AND SENTINEL LYMPH NODE BIOPSY;  Surgeon: Abigail Miyamoto, MD;  Location: [facility] OR;  Service: General;  Laterality: Right;   PORT-A-CATH REMOVAL Left 09/13/2021   Procedure: REMOVAL PORT-A-CATH;  Surgeon: Abigail Miyamoto, MD;  Location: [facility];  Service: General;  Laterality: Left;   PORTACATH PLACEMENT N/A 06/09/2021   Procedure: INSERTION PORT-A-CATH;  Surgeon: Abigail Miyamoto, MD;  Location: [facility];  Service: General;  Laterality: N/A;   Patient Active Problem List   Diagnosis Date Noted   Genetic testing 05/09/2021   Family history of breast cancer 05/05/2021   Malignant neoplasm of upper-outer quadrant of right breast in female, estrogen receptor negative (HCC) 04/28/2021     REFERRING PROVIDER: Lillard Anes, NP  REFERRING DIAG: Left breast swelling  THERAPY DIAG:  Malignant neoplasm of upper-outer quadrant of right breast in female, estrogen receptor negative  (HCC)  Lymphedema, not elsewhere classified  Aftercare following surgery for neoplasm  Abnormal posture  ONSET DATE: 05/2021  Rationale for Evaluation and Treatment: Rehabilitation  SUBJECTIVE:                                                                                                                                                                                           SUBJECTIVE STATEMENT:  [employer] called me yesterday and they left me a VM and I just need to call them back. My skin was itchy and I had a little bit of a reaction and I don't think it helped either. The cording  feels a little tighter at my incision today.   PERTINENT HISTORY:   Patient was diagnosed on 04/28/2021 with right grade 2 invasive ductal carcinoma breast cancer. She underwent a right lumpectomy and sentinel node biopsy on 05/11/2021. She had 1 node with micromets and 2 negative nodes (3 total) removed. It is ER/PR negative and HER2 negative with a Ki67 of 40%   PAIN:  Are you having pain? Yes NPRS scale: 0/10, just tender inferior to incision   PRECAUTIONS: Left UE lymphedema  WEIGHT BEARING RESTRICTIONS: No  FALLS:  Has patient fallen in last 6 months? No  LIVING ENVIRONMENT: Lives with: lives with their family, husband, daughter and mother in law Lives in: House/apartment Stairs: Yes; Internal: 10 steps; on left going up and External: 3 steps; none Has following equipment at home: None  OCCUPATION: accountant  LEISURE: nothing  HAND DOMINANCE: right   PRIOR LEVEL OF FUNCTION: Independent  PATIENT GOALS: Peau'dorange to go away, swelling go away and cording resolve   OBJECTIVE:  COGNITION: Overall cognitive status: Within functional limits for tasks assessed   PALPATION: Tender right medial breast with area of fibrosis, cording noted in axilla and upper arm not interfering with motion, cord also noted at incision area with small nodularity and running upward  andtransversely  OBSERVATIONS / OTHER ASSESSMENTS: enlarged pores medial and inferior right breast  SENSATION: Light touch: Appears intact   POSTURE: forward head, rounded shouldes  UPPER EXTREMITY AROM/PROM:  A/PROM RIGHT   eval   Shoulder extension   Shoulder flexion 160  Shoulder abduction 180  Shoulder internal rotation   Shoulder external rotation     (Blank rows = not tested)  A/PROM LEFT   eval  Shoulder extension   Shoulder flexion 166  Shoulder abduction 180  Shoulder internal rotation   Shoulder external rotation     (Blank rows = not tested)  CERVICAL AROM: All within functional limits:      UPPER EXTREMITY STRENGTH:   LYMPHEDEMA ASSESSMENTS:   SURGERY TYPE/DATE: 05/11/2021 Right lumpectomy with SLNB  NUMBER OF LYMPH NODES REMOVED: 1/3  CHEMOTHERAPY: YES  RADIATION:YES  HORMONE TREATMENT: NO  INFECTIONS: NO   LYMPHEDEMA ASSESSMENTS:   LANDMARK RIGHT  eval  At axilla    15 cm proximal to olecranon process   10 cm proximal to olecranon process   Olecranon process   15 cm proximal to ulnar styloid process   10 cm proximal to ulnar styloid process   Just proximal to ulnar styloid process   Across hand at thumb web space   At base of 2nd digit   (Blank rows = not tested)  LANDMARK LEFT  eval  At axilla    15 cm proximal to olecranon process   10 cm proximal to olecranon process   Olecranon process   15 cm proximal to ulnar styloid process   10 cm proximal to ulnar styloid process   Just proximal to ulnar styloid process   Across hand at thumb web space   At base of 2nd digit   (Blank rows = not tested)  L-DEX LYMPHEDEMA SCREENING: The patient was assessed using the L-Dex machine today to produce a lymphedema index baseline score. The patient will be reassessed on a regular basis (typically every 3 months) to obtain new L-Dex scores. If the score is > 6.5 points away from his/her baseline score indicating onset of subclinical  lymphedema, it will be recommended to wear a compression garment for 4 weeks, 12 hours per  day and then be reassessed. If the score continues to be > 6.5 points from baseline at reassessment, we will initiate lymphedema treatment. Assessing in this manner has a 95% rate of preventing clinically significant lymphedema.   BREAST COMPLAINTS QUESTIONNAIRE Pain:6 Heaviness:6 Swollen feeling:8 Tense Skin:1 Redness:2 Bra Print:0 Size of Pores:10 Hard feeling: 9 Total:   42  /80 A Score over 9 indicates lymphedema issues in the breast   TODAY'S TREATMENT:                                                                                                                                         07/04/22: Manual Therapy MLD to Rt breast in supine: Short neck, superficial and deep abdominals, Rt inguinal and Lt axillary nodes, Rt axillo-inguinal anastomosis, Lt anterior intact upper quadrant sequence, then focused on Rt breast reviewing sequence with pt.  MFR to cording palpable in medial upper arm, but mildly so STM to Rt lateral trunk where pt palpably tight and tender with Rt UE resting on pillow  06/29/22: Manual Therapy MLD to Rt breast in supine: Short neck, superficial and deep abdominals, Rt inguinal and Lt axillary nodes, Rt axillo-inguinal anastomosis, Lt anterior intact upper quadrant sequence, then focused on Rt breast reviewing sequence with pt.  MFR to cording palpable in medial upper arm, but mildly so STM to Rt lateral trunk where pt palpably tight and tender with Rt UE resting on pillow Trial of kinesiotape "I" band over incision where pt feels area of fullness and peau d'orange present. Pt educated how to safely remove to protect skin.   06/27/2022 No treatment due to lack of time  PATIENT EDUCATION:  Education details: POC, discussion of lymphedema, compression pumps Person educated: Patient Education method: Explanation Education comprehension: verbalized understanding  HOME  EXERCISE PROGRAM: NA  ASSESSMENT:  CLINICAL IMPRESSION: Pt had a skin reaction with redness from kinesiotape so did not use this again. Continued with manual therapies working to decrease Rt breast lymphedema and cording. Some improvement noted by end of session as cording was less palpable. Pt plans to call [employer] back to schedule demo for Flexitouch.    OBJECTIVE IMPAIRMENTS: decreased knowledge of condition, increased edema, increased fascial restrictions, and pain.   ACTIVITY LIMITATIONS: sleeping, able to do most but with increased swelling in breast  PARTICIPATION LIMITATIONS:  doing all  PERSONAL FACTORS: 1-2 comorbidities: Right breast cancer with chemo and radiation  are also affecting patient's functional outcome.   REHAB POTENTIAL: Good  CLINICAL DECISION MAKING: Stable/uncomplicated  EVALUATION COMPLEXITY: Low  GOALS: Goals reviewed with patient? Yes  SHORT TERM GOALS= LTG: Target date: 08/08/2022  Pt will have decreased discomfort from cording in Right axilla/chest region by 50% Baseline: Goal status: INITIAL  2.  Pt will be independent with MLD to the right breast to decrease edema Baseline:  Goal status: INITIAL  3.  Pt will note improvements in  pore size and peau'd'orange with compression and manual techniques Baseline:  Goal status: INITIAL  4.  Pt will  have Flexi touch trial as indicated for right breast swelling Baseline:  Goal status: INITIAL    PLAN:  PT FREQUENCY: 2x/week  PT DURATION: 6 weeks  PLANNED INTERVENTIONS: Therapeutic exercises, Neuromuscular re-education, Balance training, Gait training, Self Care, Orthotic/Fit training, Manual lymph drainage, scar mobilization, Taping, Vasopneumatic device, Manual therapy, and Re-evaluation  PLAN FOR NEXT SESSION: Cont MLD to breast, cording release; pump demo scheduled?  Flexitouch = demo sent 06/27/2022  Hermenia Bers, PTA 07/04/2022, 1:01 PM

## 2022-07-06 ENCOUNTER — Ambulatory Visit: Payer: BC Managed Care – PPO

## 2022-07-06 VITALS — Wt 179.5 lb

## 2022-07-06 DIAGNOSIS — R293 Abnormal posture: Secondary | ICD-10-CM

## 2022-07-06 DIAGNOSIS — C50411 Malignant neoplasm of upper-outer quadrant of right female breast: Secondary | ICD-10-CM | POA: Diagnosis not present

## 2022-07-06 DIAGNOSIS — I890 Lymphedema, not elsewhere classified: Secondary | ICD-10-CM

## 2022-07-06 DIAGNOSIS — Z483 Aftercare following surgery for neoplasm: Secondary | ICD-10-CM

## 2022-07-06 NOTE — Therapy (Signed)
OUTPATIENT PHYSICAL THERAPY  UPPER EXTREMITY ONCOLOGY TREATMENT  Patient Name: Natasha Ortega MRN: [id number] DOB:Mar 05, 1970, 52 y.o., female Today's Date: 07/06/2022  END OF SESSION:  PT End of Session - 07/06/22 1310     Visit Number 4    Number of Visits 12    Date for PT Re-Evaluation 08/08/22    PT Start Time 1205    PT Stop Time 1304    PT Time Calculation (min) 59 min    Activity Tolerance Patient tolerated treatment well    Behavior During Therapy [facility] for tasks assessed/performed              Past Medical History:  Diagnosis Date   Cancer (HCC) 04/21/2021   right breast IDC   Headache    migraines   Personal history of chemotherapy    Personal history of radiation therapy    Port-A-Cath in place 06/21/2021   Past Surgical History:  Procedure Laterality Date   BREAST LUMPECTOMY Right 05/11/2021   BREAST LUMPECTOMY WITH RADIOACTIVE SEED AND SENTINEL LYMPH NODE BIOPSY Right 05/11/2021   Procedure: RIGHT BREAST LUMPECTOMY WITH RADIOACTIVE SEED AND SENTINEL LYMPH NODE BIOPSY;  Surgeon: Abigail Miyamoto, MD;  Location: [facility];  Service: General;  Laterality: Right;   PORT-A-CATH REMOVAL Left 09/13/2021   Procedure: REMOVAL PORT-A-CATH;  Surgeon: Abigail Miyamoto, MD;  Location: [facility];  Service: General;  Laterality: Left;   PORTACATH PLACEMENT N/A 06/09/2021   Procedure: INSERTION PORT-A-CATH;  Surgeon: Abigail Miyamoto, MD;  Location: [facility];  Service: General;  Laterality: N/A;   Patient Active Problem List   Diagnosis Date Noted   Genetic testing 05/09/2021   Family history of breast cancer 05/05/2021   Malignant neoplasm of upper-outer quadrant of right breast in female, estrogen receptor negative (HCC) 04/28/2021     REFERRING PROVIDER: Lillard Anes, NP  REFERRING DIAG: Left breast swelling  THERAPY DIAG:  Malignant neoplasm of upper-outer quadrant of right breast in female, estrogen receptor negative  (HCC)  Lymphedema, not elsewhere classified  Aftercare following surgery for neoplasm  Abnormal posture  ONSET DATE: 05/2021  Rationale for Evaluation and Treatment: Rehabilitation  SUBJECTIVE:                                                                                                                                                                                           SUBJECTIVE STATEMENT:  I haven't got to call [employer] back yet because I am so busy with 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 But my  breast is as firm in the middle as it's been.   PERTINENT HISTORY:   Patient was diagnosed on 04/28/2021 with right grade 2 invasive ductal carcinoma breast cancer. She underwent a right lumpectomy and sentinel node biopsy on 05/11/2021. She had 1 node with micromets and 2 negative nodes (3 total) removed. It is ER/PR negative and HER2 negative with a Ki67 of 40%   PAIN:  Are you having pain? Yes NPRS scale: 0/10, just tender inferior to incision   PRECAUTIONS: Left UE lymphedema  WEIGHT BEARING RESTRICTIONS: No  FALLS:  Has patient fallen in last 6 months? No  LIVING ENVIRONMENT: Lives with: lives with their family, husband, daughter and mother in law Lives in: House/apartment Stairs: Yes; Internal: 10 steps; on left going up and External: 3 steps; none Has following equipment at home: None  OCCUPATION: accountant  LEISURE: nothing  HAND DOMINANCE: right   PRIOR LEVEL OF FUNCTION: Independent  PATIENT GOALS: Peau'dorange to go away, swelling go away and cording resolve   OBJECTIVE:  COGNITION: Overall cognitive status: Within functional limits for tasks assessed   PALPATION: Tender right medial breast with area of fibrosis, cording noted in axilla and upper arm not interfering with motion, cord also noted at incision area with small nodularity and running upward  andtransversely  OBSERVATIONS / OTHER ASSESSMENTS: enlarged pores medial and inferior right breast  SENSATION: Light touch: Appears intact   POSTURE: forward head, rounded shouldes  UPPER EXTREMITY AROM/PROM:  A/PROM RIGHT   eval   Shoulder extension   Shoulder flexion 160  Shoulder abduction 180  Shoulder internal rotation   Shoulder external rotation     (Blank rows = not tested)  A/PROM LEFT   eval  Shoulder extension   Shoulder flexion 166  Shoulder abduction 180  Shoulder internal rotation   Shoulder external rotation     (Blank rows = not tested)  CERVICAL AROM: All within functional limits:      UPPER EXTREMITY STRENGTH:   LYMPHEDEMA ASSESSMENTS:   SURGERY TYPE/DATE: 05/11/2021 Right lumpectomy with SLNB  NUMBER OF LYMPH NODES REMOVED: 1/3  CHEMOTHERAPY: YES  RADIATION:YES  HORMONE TREATMENT: NO  INFECTIONS: NO   LYMPHEDEMA ASSESSMENTS:   LANDMARK RIGHT  eval  At axilla    15 cm proximal to olecranon process   10 cm proximal to olecranon process   Olecranon process   15 cm proximal to ulnar styloid process   10 cm proximal to ulnar styloid process   Just proximal to ulnar styloid process   Across hand at thumb web space   At base of 2nd digit   (Blank rows = not tested)  LANDMARK LEFT  eval  At axilla    15 cm proximal to olecranon process   10 cm proximal to olecranon process   Olecranon process   15 cm proximal to ulnar styloid process   10 cm proximal to ulnar styloid process   Just proximal to ulnar styloid process   Across hand at thumb web space   At base of 2nd digit   (Blank rows = not tested)  L-DEX LYMPHEDEMA SCREENING: The patient was assessed using the L-Dex machine today to produce a lymphedema index baseline score. The patient will be reassessed on a regular basis (typically every 3 months) to obtain new L-Dex scores. If the score is > 6.5 points away from his/her baseline score indicating onset of subclinical  lymphedema, it will be recommended to wear a compression garment for 4 weeks, 12  hours per day and then be reassessed. If the score continues to be > 6.5 points from baseline at reassessment, we will initiate lymphedema treatment. Assessing in this manner has a 95% rate of preventing clinically significant lymphedema.   BREAST COMPLAINTS QUESTIONNAIRE Pain:6 Heaviness:6 Swollen feeling:8 Tense Skin:1 Redness:2 Bra Print:0 Size of Pores:10 Hard feeling: 9 Total:   42  /80 A Score over 9 indicates lymphedema issues in the breast   TODAY'S TREATMENT:                                                                                                                                         07/06/22: Manual Therapy MLD to Rt breast in supine: Short neck, superficial and deep abdominals, Rt inguinal and Lt axillary nodes, Rt axillo-inguinal anastomosis, Lt anterior intact upper quadrant sequence, then focused on Rt breast MFR to tightness in Rt axilla and at SLNB mastectomy STM to Rt lateral trunk where pt palpably tight and tender with Rt UE resting on pillow and used cocoa butter today, also worked same area in [employer] S/L and to medial scapular border Made longer chip pack for pt to wear along medial breast as she felt the original was too short and the fluid just kept getting pushed around her medial breast. So this will help encourage fluid up and out of the breast.   07/04/22: Manual Therapy MLD to Rt breast in supine: Short neck, superficial and deep abdominals, Rt inguinal and Lt axillary nodes, Rt axillo-inguinal anastomosis, Lt anterior intact upper quadrant sequence, then focused on Rt breast reviewing sequence with pt.  MFR to cording palpable in medial upper arm, but mildly so STM to Rt lateral trunk where pt palpably tight and tender with Rt UE resting on pillow  06/29/22: Manual Therapy MLD to Rt breast in supine: Short neck, superficial and deep abdominals, Rt inguinal and Lt axillary  nodes, Rt axillo-inguinal anastomosis, Lt anterior intact upper quadrant sequence, then focused on Rt breast reviewing sequence with pt.  MFR to cording palpable in medial upper arm, but mildly so STM to Rt lateral trunk where pt palpably tight and tender with Rt UE resting on pillow Trial of kinesiotape "I" band over incision where pt feels area of fullness and peau d'orange present. Pt educated how to safely remove to protect skin.     PATIENT EDUCATION:  Education details: POC, discussion of lymphedema, compression pumps Person educated: Patient Education method: Explanation Education comprehension: verbalized understanding  HOME EXERCISE PROGRAM: NA  ASSESSMENT:  CLINICAL IMPRESSION: Softening of medial breast noted today during MLD at medial Rt breast. Also cording at SLNB was decreased as well. Issued second chip pack with better medial breast coverage, see above.    OBJECTIVE IMPAIRMENTS: decreased knowledge of condition, increased edema, increased fascial restrictions, and pain.   ACTIVITY LIMITATIONS: sleeping, able to do most but with increased swelling in breast  PARTICIPATION  LIMITATIONS:  doing all  PERSONAL FACTORS: 1-2 comorbidities: Right breast cancer with chemo and radiation  are also affecting patient's functional outcome.   REHAB POTENTIAL: Good  CLINICAL DECISION MAKING: Stable/uncomplicated  EVALUATION COMPLEXITY: Low  GOALS: Goals reviewed with patient? Yes  SHORT TERM GOALS= LTG: Target date: 08/08/2022  Pt will have decreased discomfort from cording in Right axilla/chest region by 50% Baseline: Goal status: INITIAL  2.  Pt will be independent with MLD to the right breast to decrease edema Baseline:  Goal status: INITIAL  3.  Pt will note improvements in pore size and peau'd'orange with compression and manual techniques Baseline:  Goal status: INITIAL  4.  Pt will  have Flexi touch trial as indicated for right breast swelling Baseline:   Goal status: INITIAL    PLAN:  PT FREQUENCY: 2x/week  PT DURATION: 6 weeks  PLANNED INTERVENTIONS: Therapeutic exercises, Neuromuscular re-education, Balance training, Gait training, Self Care, Orthotic/Fit training, Manual lymph drainage, scar mobilization, Taping, Vasopneumatic device, Manual therapy, and Re-evaluation  PLAN FOR NEXT SESSION: Cont MLD to breast, cording release; pump demo scheduled?  Flexitouch = demo sent 06/27/2022  Hermenia Bers, PTA 07/06/2022, 1:59 PM

## 2022-07-10 ENCOUNTER — Ambulatory Visit: Payer: BC Managed Care – PPO

## 2022-07-10 DIAGNOSIS — C50411 Malignant neoplasm of upper-outer quadrant of right female breast: Secondary | ICD-10-CM | POA: Diagnosis not present

## 2022-07-10 DIAGNOSIS — I890 Lymphedema, not elsewhere classified: Secondary | ICD-10-CM

## 2022-07-10 DIAGNOSIS — Z171 Estrogen receptor negative status [ER-]: Secondary | ICD-10-CM

## 2022-07-10 DIAGNOSIS — R293 Abnormal posture: Secondary | ICD-10-CM

## 2022-07-10 DIAGNOSIS — Z483 Aftercare following surgery for neoplasm: Secondary | ICD-10-CM

## 2022-07-10 NOTE — Therapy (Signed)
OUTPATIENT PHYSICAL THERAPY  UPPER EXTREMITY ONCOLOGY TREATMENT  Patient Name: Natasha Ortega MRN: [id number] DOB:[birth year], 52 y.o., female Today's Date: 07/10/2022  END OF SESSION:  PT End of Session - 07/10/22 1106     Visit Number 5    Number of Visits 12    Date for PT Re-Evaluation 08/08/22    PT Start Time 1104    PT Stop Time 1158    PT Time Calculation (min) 54 min    Activity Tolerance Patient tolerated treatment well    Behavior During Therapy [facility] for tasks assessed/performed              Past Medical History:  Diagnosis Date   Cancer (HCC) 04/21/2021   right breast IDC   Headache    migraines   Personal history of chemotherapy    Personal history of radiation therapy    Port-A-Cath in place 06/21/2021   Past Surgical History:  Procedure Laterality Date   BREAST LUMPECTOMY Right 05/11/2021   BREAST LUMPECTOMY WITH RADIOACTIVE SEED AND SENTINEL LYMPH NODE BIOPSY Right 05/11/2021   Procedure: RIGHT BREAST LUMPECTOMY WITH RADIOACTIVE SEED AND SENTINEL LYMPH NODE BIOPSY;  Surgeon: Abigail Miyamoto, MD;  Location: [facility];  Service: General;  Laterality: Right;   PORT-A-CATH REMOVAL Left 09/13/2021   Procedure: REMOVAL PORT-A-CATH;  Surgeon: Abigail Miyamoto, MD;  Location: [facility];  Service: General;  Laterality: Left;   PORTACATH PLACEMENT N/A 06/09/2021   Procedure: INSERTION PORT-A-CATH;  Surgeon: Abigail Miyamoto, MD;  Location: [facility];  Service: General;  Laterality: N/A;   Patient Active Problem List   Diagnosis Date Noted   Genetic testing 05/09/2021   Family history of breast cancer 05/05/2021   Malignant neoplasm of upper-outer quadrant of right breast in female, estrogen receptor negative (HCC) 04/28/2021     REFERRING PROVIDER: Lillard Anes, NP  REFERRING DIAG: Left breast swelling  THERAPY DIAG:  Malignant neoplasm of upper-outer quadrant of right breast in female, estrogen receptor negative  (HCC)  Lymphedema, not elsewhere classified  Aftercare following surgery for neoplasm  Abnormal posture  ONSET DATE: 05/2021  Rationale for Evaluation and Treatment: Rehabilitation  SUBJECTIVE:                                                                                                                                                                                           SUBJECTIVE STATEMENT:  The new larger chip pack is working great! I can tell the inner breast is getting softer and the peau d'orange seems improved. It's making realize I do need to invest in the swell spot for more long term  management.   PERTINENT HISTORY:   Patient was diagnosed on 04/28/2021 with right grade 2 invasive ductal carcinoma breast cancer. She underwent a right lumpectomy and sentinel node biopsy on 05/11/2021. She had 1 node with micromets and 2 negative nodes (3 total) removed. It is ER/PR negative and HER2 negative with a Ki67 of 40%   PAIN:  Are you having pain? No, just mild tender to touch at incision but improved since last week   PRECAUTIONS: Left UE lymphedema  WEIGHT BEARING RESTRICTIONS: No  FALLS:  Has patient fallen in last 6 months? No  LIVING ENVIRONMENT: Lives with: lives with their family, husband, daughter and mother in law Lives in: House/apartment Stairs: Yes; Internal: 10 steps; on left going up and External: 3 steps; none Has following equipment at home: None  OCCUPATION: accountant  LEISURE: nothing  HAND DOMINANCE: right   PRIOR LEVEL OF FUNCTION: Independent  PATIENT GOALS: Peau'dorange to go away, swelling go away and cording resolve   OBJECTIVE:  COGNITION: Overall cognitive status: Within functional limits for tasks assessed   PALPATION: Tender right medial breast with area of fibrosis, cording noted in axilla and upper arm not interfering with motion, cord also noted at incision area with small nodularity and running upward  andtransversely  OBSERVATIONS / OTHER ASSESSMENTS: enlarged pores medial and inferior right breast  SENSATION: Light touch: Appears intact   POSTURE: forward head, rounded shouldes  UPPER EXTREMITY AROM/PROM:  A/PROM RIGHT   eval   Shoulder extension   Shoulder flexion 160  Shoulder abduction 180  Shoulder internal rotation   Shoulder external rotation     (Blank rows = not tested)  A/PROM LEFT   eval  Shoulder extension   Shoulder flexion 166  Shoulder abduction 180  Shoulder internal rotation   Shoulder external rotation     (Blank rows = not tested)  CERVICAL AROM: All within functional limits:      UPPER EXTREMITY STRENGTH:   LYMPHEDEMA ASSESSMENTS:   SURGERY TYPE/DATE: 05/11/2021 Right lumpectomy with SLNB  NUMBER OF LYMPH NODES REMOVED: 1/3  CHEMOTHERAPY: YES  RADIATION:YES  HORMONE TREATMENT: NO  INFECTIONS: NO   LYMPHEDEMA ASSESSMENTS:   LANDMARK RIGHT  eval  At axilla    15 cm proximal to olecranon process   10 cm proximal to olecranon process   Olecranon process   15 cm proximal to ulnar styloid process   10 cm proximal to ulnar styloid process   Just proximal to ulnar styloid process   Across hand at thumb web space   At base of 2nd digit   (Blank rows = not tested)  LANDMARK LEFT  eval  At axilla    15 cm proximal to olecranon process   10 cm proximal to olecranon process   Olecranon process   15 cm proximal to ulnar styloid process   10 cm proximal to ulnar styloid process   Just proximal to ulnar styloid process   Across hand at thumb web space   At base of 2nd digit   (Blank rows = not tested)  L-DEX LYMPHEDEMA SCREENING: The patient was assessed using the L-Dex machine today to produce a lymphedema index baseline score. The patient will be reassessed on a regular basis (typically every 3 months) to obtain new L-Dex scores. If the score is > 6.5 points away from his/her baseline score indicating onset of subclinical  lymphedema, it will be recommended to wear a compression garment for 4 weeks, 12 hours per day and then  be reassessed. If the score continues to be > 6.5 points from baseline at reassessment, we will initiate lymphedema treatment. Assessing in this manner has a 95% rate of preventing clinically significant lymphedema.   BREAST COMPLAINTS QUESTIONNAIRE Pain:6 Heaviness:6 Swollen feeling:8 Tense Skin:1 Redness:2 Bra Print:0 Size of Pores:10 Hard feeling: 9 Total:   42  /80 A Score over 9 indicates lymphedema issues in the breast   TODAY'S TREATMENT:                                                                                                                                         07/10/22: Manual Therapy MLD to Rt breast in supine: Short neck, superficial and deep abdominals, Rt inguinal and Lt axillary nodes, Rt axillo-inguinal anastomosis, Lt anterior intact upper quadrant sequence, then focused on Rt breast redirecting towards anastomosis STM to Rt lateral trunk where pt palpably tight and tender with Rt UE resting on pillow, encouraged pt to try chip pack here to see if this will help to desensitize area, also can have husband try to gently roll tennis ball over area for STM  07/06/22: Manual Therapy MLD to Rt breast in supine: Short neck, superficial and deep abdominals, Rt inguinal and Lt axillary nodes, Rt axillo-inguinal anastomosis, Lt anterior intact upper quadrant sequence, then focused on Rt breast MFR to tightness in Rt axilla and at SLNB mastectomy STM to Rt lateral trunk where pt palpably tight and tender with Rt UE resting on pillow and used cocoa butter today, also worked same area in [employer] S/L and to medial scapular border Made longer chip pack for pt to wear along medial breast as she felt the original was too short and the fluid just kept getting pushed around her medial breast. So this will help encourage fluid up and out of the breast.   07/04/22: Manual Therapy MLD to  Rt breast in supine: Short neck, superficial and deep abdominals, Rt inguinal and Lt axillary nodes, Rt axillo-inguinal anastomosis, Lt anterior intact upper quadrant sequence, then focused on Rt breast reviewing sequence with pt.  MFR to cording palpable in medial upper arm, but mildly so STM to Rt lateral trunk where pt palpably tight and tender with Rt UE resting on pillow     PATIENT EDUCATION:  Education details: POC, discussion of lymphedema, compression pumps Person educated: Patient Education method: Explanation Education comprehension: verbalized understanding  HOME EXERCISE PROGRAM: NA  ASSESSMENT:  CLINICAL IMPRESSION: Pts breast seemed much improved today compared to lat week. She does plan to get the breast swell spot for long term management of her breast lymphedema. Her fibrosis was much improved today with less visible peau d'orange at medial breast. Pt also reports tenderness here feeling improved as well.    OBJECTIVE IMPAIRMENTS: decreased knowledge of condition, increased edema, increased fascial restrictions, and pain.   ACTIVITY LIMITATIONS: sleeping, able to do most but with increased  swelling in breast  PARTICIPATION LIMITATIONS:  doing all  PERSONAL FACTORS: 1-2 comorbidities: Right breast cancer with chemo and radiation  are also affecting patient's functional outcome.   REHAB POTENTIAL: Good  CLINICAL DECISION MAKING: Stable/uncomplicated  EVALUATION COMPLEXITY: Low  GOALS: Goals reviewed with patient? Yes  SHORT TERM GOALS= LTG: Target date: 08/08/2022  Pt will have decreased discomfort from cording in Right axilla/chest region by 50% Baseline: Goal status: INITIAL  2.  Pt will be independent with MLD to the right breast to decrease edema Baseline:  Goal status: INITIAL  3.  Pt will note improvements in pore size and peau'd'orange with compression and manual techniques Baseline:  Goal status: INITIAL  4.  Pt will  have Flexi touch trial  as indicated for right breast swelling Baseline:  Goal status: INITIAL    PLAN:  PT FREQUENCY: 2x/week  PT DURATION: 6 weeks  PLANNED INTERVENTIONS: Therapeutic exercises, Neuromuscular re-education, Balance training, Gait training, Self Care, Orthotic/Fit training, Manual lymph drainage, scar mobilization, Taping, Vasopneumatic device, Manual therapy, and Re-evaluation  PLAN FOR NEXT SESSION: Cont MLD to breast, cording release; pump demo scheduled?  Flexitouch = demo sent 06/27/2022  Hermenia Bers, PTA 07/10/2022, 1:22 PM

## 2022-07-13 ENCOUNTER — Ambulatory Visit: Payer: BC Managed Care – PPO

## 2022-07-13 DIAGNOSIS — I890 Lymphedema, not elsewhere classified: Secondary | ICD-10-CM

## 2022-07-13 DIAGNOSIS — Z483 Aftercare following surgery for neoplasm: Secondary | ICD-10-CM

## 2022-07-13 DIAGNOSIS — C50411 Malignant neoplasm of upper-outer quadrant of right female breast: Secondary | ICD-10-CM | POA: Diagnosis not present

## 2022-07-13 DIAGNOSIS — R293 Abnormal posture: Secondary | ICD-10-CM

## 2022-07-13 DIAGNOSIS — Z171 Estrogen receptor negative status [ER-]: Secondary | ICD-10-CM

## 2022-07-13 NOTE — Therapy (Signed)
OUTPATIENT PHYSICAL THERAPY  UPPER EXTREMITY ONCOLOGY TREATMENT  Patient Name: Natasha Ortega MRN: [id number] DOB:[birth year], 52 y.o., female Today's Date: 07/13/2022  END OF SESSION:  PT End of Session - 07/13/22 1208     Visit Number 6    Number of Visits 12    Date for PT Re-Evaluation 08/08/22    PT Start Time 1206    PT Stop Time 1303    PT Time Calculation (min) 57 min    Activity Tolerance Patient tolerated treatment well    Behavior During Therapy [facility] for tasks assessed/performed              Past Medical History:  Diagnosis Date   Cancer (HCC) 04/21/2021   right breast IDC   Headache    migraines   Personal history of chemotherapy    Personal history of radiation therapy    Port-A-Cath in place 06/21/2021   Past Surgical History:  Procedure Laterality Date   BREAST LUMPECTOMY Right 05/11/2021   BREAST LUMPECTOMY WITH RADIOACTIVE SEED AND SENTINEL LYMPH NODE BIOPSY Right 05/11/2021   Procedure: RIGHT BREAST LUMPECTOMY WITH RADIOACTIVE SEED AND SENTINEL LYMPH NODE BIOPSY;  Surgeon: Abigail Miyamoto, MD;  Location: [facility] OR;  Service: General;  Laterality: Right;   PORT-A-CATH REMOVAL Left 09/13/2021   Procedure: REMOVAL PORT-A-CATH;  Surgeon: Abigail Miyamoto, MD;  Location: [facility];  Service: General;  Laterality: Left;   PORTACATH PLACEMENT N/A 06/09/2021   Procedure: INSERTION PORT-A-CATH;  Surgeon: Abigail Miyamoto, MD;  Location: [facility];  Service: General;  Laterality: N/A;   Patient Active Problem List   Diagnosis Date Noted   Genetic testing 05/09/2021   Family history of breast cancer 05/05/2021   Malignant neoplasm of upper-outer quadrant of right breast in female, estrogen receptor negative (HCC) 04/28/2021     REFERRING PROVIDER: Lillard Anes, NP  REFERRING DIAG: Left breast swelling  THERAPY DIAG:  Malignant neoplasm of upper-outer quadrant of right breast in female, estrogen receptor negative  (HCC)  Lymphedema, not elsewhere classified  Aftercare following surgery for neoplasm  Abnormal posture  ONSET DATE: 05/2021  Rationale for Evaluation and Treatment: Rehabilitation  SUBJECTIVE:                                                                                                                                                                                           SUBJECTIVE STATEMENT:  I ordered the swell spot! My superior breast is feeling a lot better.   PERTINENT HISTORY:   Patient was diagnosed on 04/28/2021 with right grade 2 invasive ductal carcinoma breast cancer. She underwent a right lumpectomy and  sentinel node biopsy on 05/11/2021. She had 1 node with micromets and 2 negative nodes (3 total) removed. It is ER/PR negative and HER2 negative with a Ki67 of 40%   PAIN:  Are you having pain? No, just mild tender to touch at incision but improved since last week   PRECAUTIONS: Left UE lymphedema  WEIGHT BEARING RESTRICTIONS: No  FALLS:  Has patient fallen in last 6 months? No  LIVING ENVIRONMENT: Lives with: lives with their family, husband, daughter and mother in law Lives in: House/apartment Stairs: Yes; Internal: 10 steps; on left going up and External: 3 steps; none Has following equipment at home: None  OCCUPATION: accountant  LEISURE: nothing  HAND DOMINANCE: right   PRIOR LEVEL OF FUNCTION: Independent  PATIENT GOALS: Peau'dorange to go away, swelling go away and cording resolve   OBJECTIVE:  COGNITION: Overall cognitive status: Within functional limits for tasks assessed   PALPATION: Tender right medial breast with area of fibrosis, cording noted in axilla and upper arm not interfering with motion, cord also noted at incision area with small nodularity and running upward andtransversely  OBSERVATIONS / OTHER ASSESSMENTS: enlarged pores medial and inferior right breast  SENSATION: Light touch: Appears intact   POSTURE: forward head,  rounded shouldes  UPPER EXTREMITY AROM/PROM:  A/PROM RIGHT   eval   Shoulder extension   Shoulder flexion 160  Shoulder abduction 180  Shoulder internal rotation   Shoulder external rotation     (Blank rows = not tested)  A/PROM LEFT   eval  Shoulder extension   Shoulder flexion 166  Shoulder abduction 180  Shoulder internal rotation   Shoulder external rotation     (Blank rows = not tested)  CERVICAL AROM: All within functional limits:      UPPER EXTREMITY STRENGTH:   LYMPHEDEMA ASSESSMENTS:   SURGERY TYPE/DATE: 05/11/2021 Right lumpectomy with SLNB  NUMBER OF LYMPH NODES REMOVED: 1/3  CHEMOTHERAPY: YES  RADIATION:YES  HORMONE TREATMENT: NO  INFECTIONS: NO   LYMPHEDEMA ASSESSMENTS:   LANDMARK RIGHT  eval  At axilla    15 cm proximal to olecranon process   10 cm proximal to olecranon process   Olecranon process   15 cm proximal to ulnar styloid process   10 cm proximal to ulnar styloid process   Just proximal to ulnar styloid process   Across hand at thumb web space   At base of 2nd digit   (Blank rows = not tested)  LANDMARK LEFT  eval  At axilla    15 cm proximal to olecranon process   10 cm proximal to olecranon process   Olecranon process   15 cm proximal to ulnar styloid process   10 cm proximal to ulnar styloid process   Just proximal to ulnar styloid process   Across hand at thumb web space   At base of 2nd digit   (Blank rows = not tested)  L-DEX LYMPHEDEMA SCREENING: The patient was assessed using the L-Dex machine today to produce a lymphedema index baseline score. The patient will be reassessed on a regular basis (typically every 3 months) to obtain new L-Dex scores. If the score is > 6.5 points away from his/her baseline score indicating onset of subclinical lymphedema, it will be recommended to wear a compression garment for 4 weeks, 12 hours per day and then be reassessed. If the score continues to be > 6.5 points from baseline  at reassessment, we will initiate lymphedema treatment. Assessing in this manner has a 95%  rate of preventing clinically significant lymphedema.   BREAST COMPLAINTS QUESTIONNAIRE Pain:6 Heaviness:6 Swollen feeling:8 Tense Skin:1 Redness:2 Bra Print:0 Size of Pores:10 Hard feeling: 9 Total:   42  /80 A Score over 9 indicates lymphedema issues in the breast   TODAY'S TREATMENT:                                                                                                                                         07/13/22: Manual Therapy MLD to Rt breast in supine: Short neck, superficial and deep abdominals, Rt inguinal and Lt axillary nodes, Rt axillo-inguinal anastomosis, Lt anterior intact upper quadrant sequence, then focused on Rt superior breast redirecting towards anterior anastomosis, next into Lt S/L for lateral breast redirecting towards lateral anastomosis, then finished retracing all steps in supine STM to Rt lateral trunk where pt palpably tight and tender with Rt UE resting on pillow   07/10/22: Manual Therapy MLD to Rt breast in supine: Short neck, superficial and deep abdominals, Rt inguinal and Lt axillary nodes, Rt axillo-inguinal anastomosis, Lt anterior intact upper quadrant sequence, then focused on Rt breast redirecting towards anastomosis STM to Rt lateral trunk where pt palpably tight and tender with Rt UE resting on pillow, encouraged pt to try chip pack here to see if this will help to desensitize area, also can have husband try to gently roll tennis ball over area for STM  07/06/22: Manual Therapy MLD to Rt breast in supine: Short neck, superficial and deep abdominals, Rt inguinal and Lt axillary nodes, Rt axillo-inguinal anastomosis, Lt anterior intact upper quadrant sequence, then focused on Rt breast MFR to tightness in Rt axilla and at SLNB mastectomy STM to Rt lateral trunk where pt palpably tight and tender with Rt UE resting on pillow and used cocoa butter  today, also worked same area in [employer] S/L and to medial scapular border Made longer chip pack for pt to wear along medial breast as she felt the original was too short and the fluid just kept getting pushed around her medial breast. So this will help encourage fluid up and out of the breast.   07/04/22: Manual Therapy MLD to Rt breast in supine: Short neck, superficial and deep abdominals, Rt inguinal and Lt axillary nodes, Rt axillo-inguinal anastomosis, Lt anterior intact upper quadrant sequence, then focused on Rt breast reviewing sequence with pt.  MFR to cording palpable in medial upper arm, but mildly so STM to Rt lateral trunk where pt palpably tight and tender with Rt UE resting on pillow     PATIENT EDUCATION:  Education details: POC, discussion of lymphedema, compression pumps Person educated: Patient Education method: Explanation Education comprehension: verbalized understanding  HOME EXERCISE PROGRAM: NA  ASSESSMENT:  CLINICAL IMPRESSION: Pts breast conts to be improved since start of care. Small area of fibrosis at incision is softer and cording is no longer palpable here. Her superior breast  is also becoming more equal in size compared to Lt superior breast, as her Rt was edematous when we started. Still so but much more mild now. Pt has ordered the breast swell spot so this should arrive soon. She has been using new compression foam in her bra and this has been helping to reduce lymphedema and fibrosis as well. Pt is also noticing improvements with breast being softer and Rt upper quadrant feeling less tight.    OBJECTIVE IMPAIRMENTS: decreased knowledge of condition, increased edema, increased fascial restrictions, and pain.   ACTIVITY LIMITATIONS: sleeping, able to do most but with increased swelling in breast  PARTICIPATION LIMITATIONS:  doing all  PERSONAL FACTORS: 1-2 comorbidities: Right breast cancer with chemo and radiation  are also affecting patient's functional  outcome.   REHAB POTENTIAL: Good  CLINICAL DECISION MAKING: Stable/uncomplicated  EVALUATION COMPLEXITY: Low  GOALS: Goals reviewed with patient? Yes  SHORT TERM GOALS= LTG: Target date: 08/08/2022  Pt will have decreased discomfort from cording in Right axilla/chest region by 50% Baseline: Goal status: INITIAL  2.  Pt will be independent with MLD to the right breast to decrease edema Baseline:  Goal status: INITIAL  3.  Pt will note improvements in pore size and peau'd'orange with compression and manual techniques Baseline:  Goal status: INITIAL  4.  Pt will  have Flexi touch trial as indicated for right breast swelling Baseline:  Goal status: INITIAL    PLAN:  PT FREQUENCY: 2x/week  PT DURATION: 6 weeks  PLANNED INTERVENTIONS: Therapeutic exercises, Neuromuscular re-education, Balance training, Gait training, Self Care, Orthotic/Fit training, Manual lymph drainage, scar mobilization, Taping, Vasopneumatic device, Manual therapy, and Re-evaluation  PLAN FOR NEXT SESSION: Cont MLD to breast, cording release; pump demo scheduled? Swell spot arrive?  Flexitouch = demo sent 06/27/2022  Hermenia Bers, PTA 07/13/2022, 1:15 PM

## 2022-07-14 NOTE — Progress Notes (Signed)
Patient Care Team: Candice Camp, MD as PCP - General (Obstetrics and Gynecology) Serena Croissant, MD as Consulting Physician (Hematology and Oncology) Dorothy Puffer, MD as Consulting Physician (Radiation Oncology) Abigail Miyamoto, MD as Consulting Physician (General Surgery) Axel Filler Larna Daughters, NP as Nurse Practitioner (Hematology and Oncology)  DIAGNOSIS: No diagnosis found.  SUMMARY OF ONCOLOGIC HISTORY: Oncology History  Malignant neoplasm of upper-outer quadrant of right breast in female, estrogen receptor negative (HCC)  04/21/2021 Initial Diagnosis   Screening mammogram detected right breast mass 6 mm by ultrasound, axilla negative, biopsy: Grade 2 IDC ER 0%, PR 0%, HER2 2+ by [facility]   05/11/2021 Surgery   Right lumpectomy: Grade 2 IDC 0.9 cm, margins negative, angiolymphatic invasion present, 1 lymph node with micrometastases, 0/2 other lymph nodes negative, ER 0%, PR 0%, HER2 negative by FISH Ki-67 25% (final pathology is negative for HER2)   05/11/2021 Cancer Staging   Staging form: Breast, AJCC 8th Edition - Pathologic stage from 05/11/2021: Stage IB (pT1b, pN73mi, cM0, G2, ER-, PR-, HER2-) - Signed by Loa Socks, NP on 08/03/2021 Stage prefix: Initial diagnosis Histologic grading system: 3 grade system   06/22/2021 - 08/26/2021 Chemotherapy   Patient is on Treatment Plan : BREAST TC q21d      Genetic Testing   [facility] CustomNext Panel was Negative. Report date is 07/05/2021.  The CustomNext-Cancer+RNAinsight panel offered by [facility] includes sequencing and rearrangement analysis for the following 47 genes:  APC, ATM, AXIN2, BARD1, BMPR1A, BRCA1, BRCA2, BRIP1, CDH1, CDK4, CDKN2A, CHEK2, CTNNA1, DICER1, EPCAM, GREM1, HOXB13, KIT, MEN1, MLH1, MSH2, MSH3, MSH6, MUTYH, NBN, NF1, NTHL1, PALB2, PDGFRA, PMS2, POLD1, POLE, PTEN, RAD50, RAD51C, RAD51D, SDHA, SDHB, SDHC, SDHD, SMAD4, SMARCA4, STK11, TP53, TSC1, TSC2, and VHL.  RNA data is routinely analyzed for use in variant  interpretation for all genes.   10/12/2021 - 11/25/2021 Radiation Therapy   Site Technique Total Dose (Gy) Dose per Fx (Gy) Completed Fx Beam Energies  Breast, Right: Breast_R 3D 50.4/50.4 1.8 28/28 10X  Breast, Right: Breast_R_SCLV 3D 50.4/50.4 1.8 28/28 10X, 15X  Breast, Right: Breast_R_Bst 3D 10/10 2 5/5 6X       CHIEF COMPLIANT: Follow-up breast cancer surveillance  INTERVAL HISTORY: Natasha Ortega is a 52 y.o. female is here because of recent diagnosis of right-sided breast cancer on Docetaxel  Cyclophosphamide She presents to the clinic today for a follow-up.    ALLERGIES:  is allergic to penicillins.  MEDICATIONS:  Current Outpatient Medications  Medication Sig Dispense Refill   ibuprofen (ADVIL) 200 MG tablet Take by mouth.     No current facility-administered medications for this visit.    PHYSICAL EXAMINATION: ECOG PERFORMANCE STATUS: {CHL ONC ECOG [phone]}  There were no vitals filed for this visit. There were no vitals filed for this visit.  BREAST:*** No palpable masses or nodules in either right or left breasts. No palpable axillary supraclavicular or infraclavicular adenopathy no breast tenderness or nipple discharge. (exam performed in the presence of a chaperone)  LABORATORY DATA:  I have reviewed the data as listed    Latest Ref Rng & Units 08/24/2021   10:34 AM 08/03/2021   10:44 AM 07/13/2021    1:30 PM  CMP  Glucose 70 - 99 mg/dL 191  478  295   BUN 6 - 20 mg/dL 13  13  17    Creatinine 0.44 - 1.00 mg/dL 6.21  3.08  6.57   Sodium 135 - 145 mmol/L 137  137  139   Potassium  3.5 - 5.1 mmol/L 3.9  4.1  3.7   Chloride 98 - 111 mmol/L 106  104  106   CO2 22 - 32 mmol/L 23  25  25    Calcium 8.9 - 10.3 mg/dL 9.0  9.8  9.7   Total Protein 6.5 - 8.1 g/dL 6.5  7.1  7.3   Total Bilirubin 0.3 - 1.2 mg/dL 0.3  0.3  0.6   Alkaline Phos 38 - 126 U/L 47  54  50   AST 15 - 41 U/L 16  20  24    ALT 0 - 44 U/L 28  37  40     Lab Results  Component Value Date    WBC 11.6 (H) 08/24/2021   HGB 11.7 (L) 08/24/2021   HCT 34.9 (L) 08/24/2021   MCV 92.3 08/24/2021   PLT 329 08/24/2021   NEUTROABS 8.3 (H) 08/24/2021    ASSESSMENT & PLAN:  No problem-specific Assessment & Plan notes found for this encounter.    No orders of the defined types were placed in this encounter.  The patient has a good understanding of the overall plan. she agrees with it. she will call with any problems that may develop before the next visit here. Total time spent: 30 mins including face to face time and time spent for planning, charting and co-ordination of care   Sherlyn Lick, CMA 07/14/22    I Janan Ridge am acting as a Neurosurgeon for [employer]  ***

## 2022-07-18 ENCOUNTER — Ambulatory Visit: Payer: BC Managed Care – PPO

## 2022-07-18 DIAGNOSIS — Z483 Aftercare following surgery for neoplasm: Secondary | ICD-10-CM

## 2022-07-18 DIAGNOSIS — R293 Abnormal posture: Secondary | ICD-10-CM

## 2022-07-18 DIAGNOSIS — I890 Lymphedema, not elsewhere classified: Secondary | ICD-10-CM

## 2022-07-18 DIAGNOSIS — C50411 Malignant neoplasm of upper-outer quadrant of right female breast: Secondary | ICD-10-CM

## 2022-07-18 NOTE — Therapy (Signed)
OUTPATIENT PHYSICAL THERAPY  UPPER EXTREMITY ONCOLOGY TREATMENT  Patient Name: Natasha Ortega MRN: [id number] DOB:[birth year], 52 y.o., female Today's Date: 07/18/2022  END OF SESSION:  PT End of Session - 07/18/22 1208     Visit Number 7    Number of Visits 12    Date for PT Re-Evaluation 08/08/22    PT Start Time 1207    PT Stop Time 1256   pt requested to leave early for an appt   PT Time Calculation (min) 49 min    Activity Tolerance Patient tolerated treatment well    Behavior During Therapy [facility] for tasks assessed/performed              Past Medical History:  Diagnosis Date   Cancer (HCC) 04/21/2021   right breast IDC   Headache    migraines   Personal history of chemotherapy    Personal history of radiation therapy    Port-A-Cath in place 06/21/2021   Past Surgical History:  Procedure Laterality Date   BREAST LUMPECTOMY Right 05/11/2021   BREAST LUMPECTOMY WITH RADIOACTIVE SEED AND SENTINEL LYMPH NODE BIOPSY Right 05/11/2021   Procedure: RIGHT BREAST LUMPECTOMY WITH RADIOACTIVE SEED AND SENTINEL LYMPH NODE BIOPSY;  Surgeon: Abigail Miyamoto, MD;  Location: [facility];  Service: General;  Laterality: Right;   PORT-A-CATH REMOVAL Left 09/13/2021   Procedure: REMOVAL PORT-A-CATH;  Surgeon: Abigail Miyamoto, MD;  Location: [facility];  Service: General;  Laterality: Left;   PORTACATH PLACEMENT N/A 06/09/2021   Procedure: INSERTION PORT-A-CATH;  Surgeon: Abigail Miyamoto, MD;  Location: [facility];  Service: General;  Laterality: N/A;   Patient Active Problem List   Diagnosis Date Noted   Genetic testing 05/09/2021   Family history of breast cancer 05/05/2021   Malignant neoplasm of upper-outer quadrant of right breast in female, estrogen receptor negative (HCC) 04/28/2021     REFERRING PROVIDER: Lillard Anes, NP  REFERRING DIAG: Left breast swelling  THERAPY DIAG:  Malignant neoplasm of upper-outer quadrant of right breast in  female, estrogen receptor negative (HCC)  Lymphedema, not elsewhere classified  Aftercare following surgery for neoplasm  Abnormal posture  ONSET DATE: 05/2021  Rationale for Evaluation and Treatment: Rehabilitation  SUBJECTIVE:                                                                                                                                                                                           SUBJECTIVE STATEMENT:  The swell spot is supposed to arrive today. I've been using the chip pack and moving it around as the fluid moved. Overall my breast is feeling much better and  less tender. Still some tenderness at the lower quadrant of my breast where it gets the worse, but it isn't hard there anymore. Also still some tenderness at my Rt lateral trunk.   PERTINENT HISTORY:   Patient was diagnosed on 04/28/2021 with right grade 2 invasive ductal carcinoma breast cancer. She underwent a right lumpectomy and sentinel node biopsy on 05/11/2021. She had 1 node with micromets and 2 negative nodes (3 total) removed. It is ER/PR negative and HER2 negative with a Ki67 of 40%   PAIN:  Are you having pain? No, just sore at Rt lateral trunk   PRECAUTIONS: Left UE lymphedema  WEIGHT BEARING RESTRICTIONS: No  FALLS:  Has patient fallen in last 6 months? No  LIVING ENVIRONMENT: Lives with: lives with their family, husband, daughter and mother in law Lives in: House/apartment Stairs: Yes; Internal: 10 steps; on left going up and External: 3 steps; none Has following equipment at home: None  OCCUPATION: accountant  LEISURE: nothing  HAND DOMINANCE: right   PRIOR LEVEL OF FUNCTION: Independent  PATIENT GOALS: Peau'dorange to go away, swelling go away and cording resolve   OBJECTIVE:  COGNITION: Overall cognitive status: Within functional limits for tasks assessed   PALPATION: Tender right medial breast with area of fibrosis, cording noted in axilla and upper arm not  interfering with motion, cord also noted at incision area with small nodularity and running upward andtransversely  OBSERVATIONS / OTHER ASSESSMENTS: enlarged pores medial and inferior right breast  SENSATION: Light touch: Appears intact   POSTURE: forward head, rounded shouldes  UPPER EXTREMITY AROM/PROM:  A/PROM RIGHT   eval   Shoulder extension   Shoulder flexion 160  Shoulder abduction 180  Shoulder internal rotation   Shoulder external rotation     (Blank rows = not tested)  A/PROM LEFT   eval  Shoulder extension   Shoulder flexion 166  Shoulder abduction 180  Shoulder internal rotation   Shoulder external rotation     (Blank rows = not tested)  CERVICAL AROM: All within functional limits:      UPPER EXTREMITY STRENGTH:   LYMPHEDEMA ASSESSMENTS:   SURGERY TYPE/DATE: 05/11/2021 Right lumpectomy with SLNB  NUMBER OF LYMPH NODES REMOVED: 1/3  CHEMOTHERAPY: YES  RADIATION:YES  HORMONE TREATMENT: NO  INFECTIONS: NO   LYMPHEDEMA ASSESSMENTS:   LANDMARK RIGHT  eval  At axilla    15 cm proximal to olecranon process   10 cm proximal to olecranon process   Olecranon process   15 cm proximal to ulnar styloid process   10 cm proximal to ulnar styloid process   Just proximal to ulnar styloid process   Across hand at thumb web space   At base of 2nd digit   (Blank rows = not tested)  LANDMARK LEFT  eval  At axilla    15 cm proximal to olecranon process   10 cm proximal to olecranon process   Olecranon process   15 cm proximal to ulnar styloid process   10 cm proximal to ulnar styloid process   Just proximal to ulnar styloid process   Across hand at thumb web space   At base of 2nd digit   (Blank rows = not tested)  L-DEX LYMPHEDEMA SCREENING: The patient was assessed using the L-Dex machine today to produce a lymphedema index baseline score. The patient will be reassessed on a regular basis (typically every 3 months) to obtain new L-Dex scores.  If the score is > 6.5 points away from his/her  baseline score indicating onset of subclinical lymphedema, it will be recommended to wear a compression garment for 4 weeks, 12 hours per day and then be reassessed. If the score continues to be > 6.5 points from baseline at reassessment, we will initiate lymphedema treatment. Assessing in this manner has a 95% rate of preventing clinically significant lymphedema.   BREAST COMPLAINTS QUESTIONNAIRE Pain:6 Heaviness:6 Swollen feeling:8 Tense Skin:1 Redness:2 Bra Print:0 Size of Pores:10 Hard feeling: 9 Total:   42  /80 A Score over 9 indicates lymphedema issues in the breast   TODAY'S TREATMENT:                                                                                                                                         07/18/22: Manual Therapy MLD to Rt breast in supine: Short neck, superficial and deep abdominals, Rt inguinal and Lt axillary nodes, Rt axillo-inguinal anastomosis, Lt anterior intact upper quadrant sequence, anterior inter-axillary anastomosis, then focused on Rt superior breast redirecting towards anterior anastomosis, next into Lt S/L for lateral breast redirecting towards lateral anastomosis, then finished retracing all steps in supine STM to Rt lateral trunk where pt palpably tight and tender, though this has improved, with Rt UE resting on pillow ; also continued working same when in Lt S/L Scap Mobs in [place] S/L to Rt scapula into protraction and retraction  07/13/22: Manual Therapy MLD to Rt breast in supine: Short neck, superficial and deep abdominals, Rt inguinal and Lt axillary nodes, Rt axillo-inguinal anastomosis, Lt anterior intact upper quadrant sequence, then focused on Rt superior breast redirecting towards anterior anastomosis, next into Lt S/L for lateral breast redirecting towards lateral anastomosis, then finished retracing all steps in supine STM to Rt lateral trunk where pt palpably tight and tender with  Rt UE resting on pillow   07/10/22: Manual Therapy MLD to Rt breast in supine: Short neck, superficial and deep abdominals, Rt inguinal and Lt axillary nodes, Rt axillo-inguinal anastomosis, Lt anterior intact upper quadrant sequence, then focused on Rt breast redirecting towards anastomosis STM to Rt lateral trunk where pt palpably tight and tender with Rt UE resting on pillow, encouraged pt to try chip pack here to see if this will help to desensitize area, also can have husband try to gently roll tennis ball over area for STM       PATIENT EDUCATION:  Education details: POC, discussion of lymphedema, compression pumps Person educated: Patient Education method: Explanation Education comprehension: verbalized understanding  HOME EXERCISE PROGRAM: NA  ASSESSMENT:  CLINICAL IMPRESSION: Pt reports good improvement noted overall since start of care today. She reports though still some very mild tenderness present, this is much improved. Also medial aspect of breast that was fibrotic is much softer and peau d'orange improved as well. Pts swell spot is due to arrive today and she is eager to start using that once it arrives. May cancel  next appt this week and start decr freq to 1x/wk working towards D/C in next few weeks.   OBJECTIVE IMPAIRMENTS: decreased knowledge of condition, increased edema, increased fascial restrictions, and pain.   ACTIVITY LIMITATIONS: sleeping, able to do most but with increased swelling in breast  PARTICIPATION LIMITATIONS:  doing all  PERSONAL FACTORS: 1-2 comorbidities: Right breast cancer with chemo and radiation  are also affecting patient's functional outcome.   REHAB POTENTIAL: Good  CLINICAL DECISION MAKING: Stable/uncomplicated  EVALUATION COMPLEXITY: Low  GOALS: Goals reviewed with patient? Yes  SHORT TERM GOALS= LTG: Target date: 08/08/2022  Pt will have decreased discomfort from cording in Right axilla/chest region by 50% Baseline: Goal  status: INITIAL  2.  Pt will be independent with MLD to the right breast to decrease edema Baseline:  Goal status: INITIAL  3.  Pt will note improvements in pore size and peau'd'orange with compression and manual techniques Baseline:  Goal status: INITIAL  4.  Pt will  have Flexi touch trial as indicated for right breast swelling Baseline:  Goal status: INITIAL    PLAN:  PT FREQUENCY: 2x/week  PT DURATION: 6 weeks  PLANNED INTERVENTIONS: Therapeutic exercises, Neuromuscular re-education, Balance training, Gait training, Self Care, Orthotic/Fit training, Manual lymph drainage, scar mobilization, Taping, Vasopneumatic device, Manual therapy, and Re-evaluation  PLAN FOR NEXT SESSION: Cont MLD to breast, cording release; pump demo scheduled? How is new swell spot?  Flexitouch = demo sent 06/27/2022  Hermenia Bers, PTA 07/18/2022, 1:07 PM

## 2022-07-20 ENCOUNTER — Ambulatory Visit: Payer: BC Managed Care – PPO

## 2022-07-20 ENCOUNTER — Inpatient Hospital Stay: Payer: BC Managed Care – PPO | Attending: Hematology and Oncology | Admitting: Hematology and Oncology

## 2022-07-20 VITALS — BP 112/87 | HR 75 | Temp 97.3°F | Resp 18 | Ht 64.5 in | Wt 176.6 lb

## 2022-07-20 DIAGNOSIS — Z171 Estrogen receptor negative status [ER-]: Secondary | ICD-10-CM | POA: Diagnosis not present

## 2022-07-20 DIAGNOSIS — C50411 Malignant neoplasm of upper-outer quadrant of right female breast: Secondary | ICD-10-CM | POA: Diagnosis not present

## 2022-07-20 DIAGNOSIS — Z853 Personal history of malignant neoplasm of breast: Secondary | ICD-10-CM | POA: Insufficient documentation

## 2022-07-20 DIAGNOSIS — Z08 Encounter for follow-up examination after completed treatment for malignant neoplasm: Secondary | ICD-10-CM | POA: Diagnosis present

## 2022-07-20 NOTE — Assessment & Plan Note (Addendum)
04/21/2021:Screening mammogram detected right breast mass 6 mm by ultrasound, axilla negative, biopsy: Grade 2 IDC ER 0%, PR 0%, HER2 2+ by IHC 05/11/2021:Right lumpectomy: Grade 2 IDC 0.9 cm, margins negative, angiolymphatic invasion present, 1 lymph node with micrometastases, 0/2 other lymph nodes negative, ER 0%, PR 0%, HER2 2+ by IHC and FISH negative with a ratio 1.33 Ki-67 25%   Treatment plan: 1.  Adjuvant chemotherapy with Taxotere Cytoxan every 3 weeks x4 completed 08/24/2021 2.  Adjuvant radiation to be completed 11/25/2021 -------------------------------------------------------------------------------------------------------------------    Breast cancer surveillance: Breast exam 07/20/2022: Benign Mammogram 04/06/2022: Benign breast density category B Signatera testing: Negative  Return to clinic in 1 year for follow-up for long-term survivorship with Mardella Layman

## 2022-07-24 ENCOUNTER — Ambulatory Visit: Payer: BC Managed Care – PPO

## 2022-08-03 ENCOUNTER — Ambulatory Visit: Payer: BC Managed Care – PPO

## 2022-08-10 ENCOUNTER — Ambulatory Visit: Payer: BC Managed Care – PPO

## 2022-08-19 LAB — SIGNATERA
SIGNATERA MTM READOUT: 0 MTM/ml
SIGNATERA TEST RESULT: NEGATIVE

## 2022-08-30 ENCOUNTER — Telehealth: Payer: Self-pay

## 2022-08-30 NOTE — Telephone Encounter (Signed)
Called pt per MD to advise Signatera testing was negative/not detected. Pt verbalized understanding of results and knows [employer] will be in touch to schedule 3 mo repeat lab.   

## 2022-10-16 ENCOUNTER — Ambulatory Visit: Payer: BC Managed Care – PPO

## 2022-10-30 ENCOUNTER — Ambulatory Visit: Payer: BC Managed Care – PPO

## 2022-11-07 ENCOUNTER — Encounter: Payer: Self-pay | Admitting: Adult Health

## 2022-11-09 ENCOUNTER — Other Ambulatory Visit: Payer: Self-pay

## 2022-11-09 DIAGNOSIS — Z171 Estrogen receptor negative status [ER-]: Secondary | ICD-10-CM

## 2022-11-14 ENCOUNTER — Ambulatory Visit: Payer: BC Managed Care – PPO | Attending: Adult Health

## 2022-11-14 VITALS — Wt 188.1 lb

## 2022-11-14 DIAGNOSIS — Z483 Aftercare following surgery for neoplasm: Secondary | ICD-10-CM | POA: Insufficient documentation

## 2022-11-14 NOTE — Therapy (Signed)
OUTPATIENT PHYSICAL THERAPY SOZO SCREENING NOTE   Patient Name: Natasha Ortega MRN: [id number] DOB:1970-05-11, 52 y.o., female Today's Date: 11/14/2022  PCP: Candice Camp, MD REFERRING PROVIDER: Lillard Anes Cornett*   PT End of Session - 11/14/22 1105     Visit Number 7   # unchanged due to screen only   PT Start Time 1103    PT Stop Time 1107    PT Time Calculation (min) 4 min    Activity Tolerance Patient tolerated treatment well    Behavior During Therapy [facility] for tasks assessed/performed             Past Medical History:  Diagnosis Date   Cancer (HCC) 04/21/2021   right breast IDC   Headache    migraines   Personal history of chemotherapy    Personal history of radiation therapy    Port-A-Cath in place 06/21/2021   Past Surgical History:  Procedure Laterality Date   BREAST LUMPECTOMY Right 05/11/2021   BREAST LUMPECTOMY WITH RADIOACTIVE SEED AND SENTINEL LYMPH NODE BIOPSY Right 05/11/2021   Procedure: RIGHT BREAST LUMPECTOMY WITH RADIOACTIVE SEED AND SENTINEL LYMPH NODE BIOPSY;  Surgeon: Abigail Miyamoto, MD;  Location: [facility] OR;  Service: General;  Laterality: Right;   PORT-A-CATH REMOVAL Left 09/13/2021   Procedure: REMOVAL PORT-A-CATH;  Surgeon: Abigail Miyamoto, MD;  Location: [facility];  Service: General;  Laterality: Left;   PORTACATH PLACEMENT N/A 06/09/2021   Procedure: INSERTION PORT-A-CATH;  Surgeon: Abigail Miyamoto, MD;  Location: [facility];  Service: General;  Laterality: N/A;   Patient Active Problem List   Diagnosis Date Noted   Genetic testing 05/09/2021   Family history of breast cancer 05/05/2021   Malignant neoplasm of upper-outer quadrant of right breast in female, estrogen receptor negative (HCC) 04/28/2021    REFERRING DIAG: right breast cancer at risk for lymphedema  THERAPY DIAG: Aftercare following surgery for neoplasm  PERTINENT HISTORY: Patient was diagnosed on 04/28/2021 with right grade 2  invasive ductal carcinoma breast cancer. She underwent a right lumpectomy and sentinel node biopsy on 05/11/2021. She had 1 node with micromets and 2 negative nodes (3 total) removed. It is ER/PR negative and HER2 negative with a Ki67 of 40%   PRECAUTIONS: right UE Lymphedema risk, None  SUBJECTIVE: Pt returns for her 3 month L-Dex screen.   PAIN:  Are you having pain? No  SOZO SCREENING: Patient was assessed today using the SOZO machine to determine the lymphedema index score. This was compared to her baseline score. It was determined that she is within the recommended range when compared to her baseline and no further action is needed at this time. She will continue SOZO screenings. These are done every 3 months for 2 years post operatively followed by every 6 months for 2 years, and then annually.   L-DEX FLOWSHEETS - 11/14/22 1100       L-DEX LYMPHEDEMA SCREENING   Measurement Type Unilateral    L-DEX MEASUREMENT EXTREMITY Upper Extremity    POSITION  Standing    DOMINANT SIDE Right    At Risk Side Right    BASELINE SCORE (UNILATERAL) -5.6    L-DEX SCORE (UNILATERAL) -2.8    VALUE CHANGE (UNILAT) 2.8               Hermenia Bers, PTA 11/14/2022, 11:06 AM

## 2022-11-15 LAB — SIGNATERA
SIGNATERA MTM READOUT: 0 MTM/ml
SIGNATERA TEST RESULT: NEGATIVE

## 2022-11-24 NOTE — Telephone Encounter (Signed)
Attempted to call pt regarding signatera results lvm for pt to return call back to receive results. Results were negative.

## 2023-02-09 LAB — SIGNATERA
SIGNATERA MTM READOUT: 0 MTM/ml
SIGNATERA TEST RESULT: NEGATIVE

## 2023-02-12 ENCOUNTER — Ambulatory Visit: Payer: BC Managed Care – PPO

## 2023-02-16 ENCOUNTER — Telehealth: Payer: Self-pay

## 2023-02-16 NOTE — Telephone Encounter (Signed)
 Called pt per MD to advise Signatera testing was negative/not detected. Pt verbalized understanding of results and knows [facility] Nail will be in touch to schedule 3-6 mo repeat lab.

## 2023-02-19 ENCOUNTER — Encounter: Payer: Self-pay | Admitting: Adult Health

## 2023-02-19 ENCOUNTER — Other Ambulatory Visit: Payer: Self-pay | Admitting: Adult Health

## 2023-02-19 DIAGNOSIS — Z9889 Other specified postprocedural states: Secondary | ICD-10-CM

## 2023-02-26 ENCOUNTER — Ambulatory Visit: Payer: BC Managed Care – PPO | Attending: Adult Health

## 2023-02-26 DIAGNOSIS — Z483 Aftercare following surgery for neoplasm: Secondary | ICD-10-CM | POA: Insufficient documentation

## 2023-02-26 NOTE — Therapy (Signed)
 OUTPATIENT PHYSICAL THERAPY SOZO SCREENING NOTE   Patient Name: Natasha Ortega MRN: [id number] DOB:07-Mar-1970, 52 y.o., female Today's Date: 02/26/2023  PCP: Marget Lenis, MD REFERRING PROVIDER: Crawford Jacobsen Cornett*   PT End of Session - 02/26/23 0932     Visit Number 7   unchanged due to screen only   PT Start Time 0935    PT Stop Time 0952    PT Time Calculation (min) 17 min    Activity Tolerance Patient tolerated treatment well    Behavior During Therapy [facility] for tasks assessed/performed             Past Medical History:  Diagnosis Date   Cancer (HCC) 04/21/2021   right breast IDC   Headache    migraines   Personal history of chemotherapy    Personal history of radiation therapy    Port-A-Cath in place 06/21/2021   Past Surgical History:  Procedure Laterality Date   BREAST LUMPECTOMY Right 05/11/2021   BREAST LUMPECTOMY WITH RADIOACTIVE SEED AND SENTINEL LYMPH NODE BIOPSY Right 05/11/2021   Procedure: RIGHT BREAST LUMPECTOMY WITH RADIOACTIVE SEED AND SENTINEL LYMPH NODE BIOPSY;  Surgeon: Vernetta Berg, MD;  Location: [facility];  Service: General;  Laterality: Right;   PORT-A-CATH REMOVAL Left 09/13/2021   Procedure: REMOVAL PORT-A-CATH;  Surgeon: Vernetta Berg, MD;  Location: [facility];  Service: General;  Laterality: Left;   PORTACATH PLACEMENT N/A 06/09/2021   Procedure: INSERTION PORT-A-CATH;  Surgeon: Vernetta Berg, MD;  Location: [facility];  Service: General;  Laterality: N/A;   Patient Active Problem List   Diagnosis Date Noted   Genetic testing 05/09/2021   Family history of breast cancer 05/05/2021   Malignant neoplasm of upper-outer quadrant of right breast in female, estrogen receptor negative (HCC) 04/28/2021    REFERRING DIAG: right breast cancer at risk for lymphedema  THERAPY DIAG:  Aftercare following surgery for neoplasm  PERTINENT HISTORY:   Patient was diagnosed on 04/28/2021 with right grade 2  invasive ductal carcinoma breast cancer. She underwent a right lumpectomy and sentinel node biopsy on 05/11/2021. She had 1 node with micromets and 2 negative nodes (3 total) removed. It is ER/PR negative and HER2 negative with a Ki67 of 40%   PRECAUTIONS: right UE Lymphedema risk,   SUBJECTIVE: I do some MLD and I dont have any swelling. The cords are still there but don't bother my motion.  PAIN:  Are you having pain? No  SOZO SCREENING: Patient was assessed today using the SOZO machine to determine the lymphedema index score. This was compared to her baseline score. It was determined that she is within the recommended range when compared to her baseline and no further action is needed at this time. She will continue SOZO screenings. These are done every 3 months for 2 years post operatively followed by every 6 months for 2 years, and then annually.   L-DEX FLOWSHEETS - 02/26/23 0900       L-DEX LYMPHEDEMA SCREENING   Measurement Type Unilateral    L-DEX MEASUREMENT EXTREMITY Upper Extremity    POSITION  Standing    DOMINANT SIDE Right    At Risk Side Right    BASELINE SCORE (UNILATERAL) -5.6    L-DEX SCORE (UNILATERAL) -1.6    VALUE CHANGE (UNILAT) 4    Comment 192.6 lbs           Answered pt questions about hydration, concerns for score rising. Advised to contact us  if she has concerns about  swelling or heaviness in arm and we will screen earlier.   Grayce JINNY Sheldon, PT 02/26/2023, 9:53 AM

## 2023-04-10 ENCOUNTER — Ambulatory Visit
Admission: RE | Admit: 2023-04-10 | Discharge: 2023-04-10 | Disposition: A | Discharge status: Home / Self Care | Payer: BC Managed Care – PPO | Source: Ambulatory Visit | Attending: Adult Health | Admitting: Adult Health

## 2023-04-10 DIAGNOSIS — Z9889 Other specified postprocedural states: Secondary | ICD-10-CM

## 2023-05-08 LAB — SIGNATERA ONLY (NATERA MANAGED)
SIGNATERA MTM READOUT: 0 MTM/ml
SIGNATERA TEST RESULT: NEGATIVE

## 2023-06-04 ENCOUNTER — Ambulatory Visit: Payer: BC Managed Care – PPO | Attending: Adult Health

## 2023-06-04 VITALS — Wt 193.0 lb

## 2023-06-04 DIAGNOSIS — Z483 Aftercare following surgery for neoplasm: Secondary | ICD-10-CM | POA: Insufficient documentation

## 2023-06-04 NOTE — Therapy (Signed)
  OUTPATIENT PHYSICAL THERAPY SOZO SCREENING NOTE   Patient Name: Natasha Ortega MRN: [id number] DOB:13-Aug-1970, 53 y.o., female Today's Date: 06/04/2023  PCP: Candice Camp, MD REFERRING PROVIDER: Lillard Anes Cornett*   PT End of Session - 06/04/23 0954     Visit Number 7   # unchanged due to screen only   PT Start Time 0951    PT Stop Time 0956    PT Time Calculation (min) 5 min    Activity Tolerance Patient tolerated treatment well    Behavior During Therapy [facility] for tasks assessed/performed             Past Medical History:  Diagnosis Date   Cancer (HCC) 04/21/2021   right breast IDC   Headache    migraines   Personal history of chemotherapy    Personal history of radiation therapy    Port-A-Cath in place 06/21/2021   Past Surgical History:  Procedure Laterality Date   BREAST LUMPECTOMY Right 05/11/2021   BREAST LUMPECTOMY WITH RADIOACTIVE SEED AND SENTINEL LYMPH NODE BIOPSY Right 05/11/2021   Procedure: RIGHT BREAST LUMPECTOMY WITH RADIOACTIVE SEED AND SENTINEL LYMPH NODE BIOPSY;  Surgeon: Abigail Miyamoto, MD;  Location: [facility];  Service: General;  Laterality: Right;   PORT-A-CATH REMOVAL Left 09/13/2021   Procedure: REMOVAL PORT-A-CATH;  Surgeon: Abigail Miyamoto, MD;  Location: [facility];  Service: General;  Laterality: Left;   PORTACATH PLACEMENT N/A 06/09/2021   Procedure: INSERTION PORT-A-CATH;  Surgeon: Abigail Miyamoto, MD;  Location: [facility];  Service: General;  Laterality: N/A;   Patient Active Problem List   Diagnosis Date Noted   Genetic testing 05/09/2021   Family history of breast cancer 05/05/2021   Malignant neoplasm of upper-outer quadrant of right breast in female, estrogen receptor negative (HCC) 04/28/2021    REFERRING DIAG: right breast cancer at risk for lymphedema  THERAPY DIAG:  Aftercare following surgery for neoplasm  PERTINENT HISTORY:   Patient was diagnosed on 04/28/2021 with right grade 2  invasive ductal carcinoma breast cancer. She underwent a right lumpectomy and sentinel node biopsy on 05/11/2021. She had 1 node with micromets and 2 negative nodes (3 total) removed. It is ER/PR negative and HER2 negative with a Ki67 of 40%   PRECAUTIONS: right UE Lymphedema risk,   SUBJECTIVE: Pt returns for her last 3 month L-Dex screen.   PAIN:  Are you having pain? No  SOZO SCREENING: Patient was assessed today using the SOZO machine to determine the lymphedema index score. This was compared to her baseline score. It was determined that she is within the recommended range when compared to her baseline and no further action is needed at this time. She will continue SOZO screenings. These are done every 3 months for 2 years post operatively followed by every 6 months for 2 years, and then annually.   L-DEX FLOWSHEETS - 06/04/23 0900       L-DEX LYMPHEDEMA SCREENING   Measurement Type Unilateral    L-DEX MEASUREMENT EXTREMITY Upper Extremity    POSITION  Standing    DOMINANT SIDE Right    At Risk Side Right    BASELINE SCORE (UNILATERAL) -5.6    L-DEX SCORE (UNILATERAL) -11.1    VALUE CHANGE (UNILAT) -5.5           . P: Begin 6 month L-Dex screens.   Hermenia Bers, PTA 06/04/2023, 9:56 AM

## 2023-07-20 ENCOUNTER — Inpatient Hospital Stay: Payer: BC Managed Care – PPO | Attending: Adult Health | Admitting: Adult Health

## 2023-07-20 ENCOUNTER — Other Ambulatory Visit: Payer: Self-pay

## 2023-07-20 ENCOUNTER — Encounter: Payer: Self-pay | Admitting: Adult Health

## 2023-07-20 VITALS — BP 121/88 | HR 59 | Temp 98.2°F | Resp 18 | Wt 192.1 lb

## 2023-07-20 DIAGNOSIS — Z923 Personal history of irradiation: Secondary | ICD-10-CM | POA: Diagnosis not present

## 2023-07-20 DIAGNOSIS — Z171 Estrogen receptor negative status [ER-]: Secondary | ICD-10-CM | POA: Diagnosis not present

## 2023-07-20 DIAGNOSIS — C50411 Malignant neoplasm of upper-outer quadrant of right female breast: Secondary | ICD-10-CM

## 2023-07-20 DIAGNOSIS — Z9221 Personal history of antineoplastic chemotherapy: Secondary | ICD-10-CM | POA: Diagnosis not present

## 2023-07-20 DIAGNOSIS — Z853 Personal history of malignant neoplasm of breast: Secondary | ICD-10-CM | POA: Diagnosis present

## 2023-07-20 NOTE — Progress Notes (Signed)
 [facility] Cancer Follow up:    Belle Box, MD [address], [address] [place] [address]   DIAGNOSIS:  Cancer Staging  Malignant neoplasm of upper-outer quadrant of right breast in female, estrogen receptor negative (HCC) Staging form: Breast, AJCC 8th Edition - Pathologic stage from 05/11/2021: Stage IB (pT1b, pN70mi, cM0, G2, ER-, PR-, HER2-) - Signed by Percival Brace, NP on 08/03/2021 Stage prefix: Initial diagnosis Histologic grading system: 3 grade system - Pathologic stage from 09/05/2021: Stage IB (pT1b, pN79mi, cM0, G2, ER-, PR-, HER2-) - Unsigned Histologic grading system: 3 grade system    SUMMARY OF ONCOLOGIC HISTORY: Oncology History  Malignant neoplasm of upper-outer quadrant of right breast in female, estrogen receptor negative (HCC)  04/21/2021 Initial Diagnosis   Screening mammogram detected right breast mass 6 mm by ultrasound, axilla negative, biopsy: Grade 2 IDC ER 0%, PR 0%, HER2 2+ by IHC   05/11/2021 Surgery   Right lumpectomy: Grade 2 IDC 0.9 cm, margins negative, angiolymphatic invasion present, 1 lymph node with micrometastases, 0/2 other lymph nodes negative, ER 0%, PR 0%, HER2 negative by FISH Ki-67 25% (final pathology is negative for HER2)   05/11/2021 Cancer Staging   Staging form: Breast, AJCC 8th Edition - Pathologic stage from 05/11/2021: Stage IB (pT1b, pN43mi, cM0, G2, ER-, PR-, HER2-) - Signed by Percival Brace, NP on 08/03/2021 Stage prefix: Initial diagnosis Histologic grading system: 3 grade system   06/22/2021 - 08/26/2021 Chemotherapy   Patient is on Treatment Plan : BREAST TC q21d      Genetic Testing   [facility] CustomNext Panel was Negative. Report date is 07/05/2021.  The CustomNext-Cancer+RNAinsight panel offered by [facility] includes sequencing and rearrangement analysis for the following 47 genes:  APC, ATM, AXIN2, BARD1, BMPR1A, BRCA1, BRCA2, BRIP1, CDH1, CDK4, CDKN2A, CHEK2, CTNNA1, DICER1, EPCAM,  GREM1, HOXB13, KIT, MEN1, MLH1, MSH2, MSH3, MSH6, MUTYH, NBN, NF1, NTHL1, PALB2, PDGFRA, PMS2, POLD1, POLE, PTEN, RAD50, RAD51C, RAD51D, SDHA, SDHB, SDHC, SDHD, SMAD4, SMARCA4, STK11, TP53, TSC1, TSC2, and VHL.  RNA data is routinely analyzed for use in variant interpretation for all genes.   10/12/2021 - 11/25/2021 Radiation Therapy   Site Technique Total Dose (Gy) Dose per Fx (Gy) Completed Fx Beam Energies  Breast, Right: Breast_R 3D 50.4/50.4 1.8 28/28 10X  Breast, Right: Breast_R_SCLV 3D 50.4/50.4 1.8 28/28 10X, 15X  Breast, Right: Breast_R_Bst 3D 10/10 2 5/5 6X       CURRENT THERAPY:  INTERVAL HISTORY:  Discussed the use of AI scribe software for clinical note transcription with the patient, who gave verbal consent to proceed.  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 Her mammogram on April 10, 2023, shows no evidence of malignancy, with breast density category B. She is undergoing Signatera testing quarterly without issues.  She experiences residual scar tissue and cording, which she manages with stretching and massage. These symptoms have improved but persist to a lesser degree than previous.  She inquires about a study on copper levels in triple negative breast cancer patients and the role of zinc in reducing recurrence. She also asks about the status of a vaccine study for triple negative breast cancer.     Patient Active Problem List   Diagnosis Date Noted   Genetic testing  05/09/2021   Family history of breast cancer 05/05/2021   Malignant neoplasm of upper-outer quadrant of right breast in female, estrogen receptor negative (HCC) 04/28/2021    is allergic to penicillins.  MEDICAL HISTORY: Past Medical History:   Diagnosis Date   Cancer (HCC) 04/21/2021   right breast IDC   Headache    migraines   Personal history of chemotherapy    Personal history of radiation therapy    Port-A-Cath in place 06/21/2021    SURGICAL HISTORY: Past Surgical History:  Procedure Laterality Date   BREAST LUMPECTOMY Right 05/11/2021   BREAST LUMPECTOMY WITH RADIOACTIVE SEED AND SENTINEL LYMPH NODE BIOPSY Right 05/11/2021   Procedure: RIGHT BREAST LUMPECTOMY WITH RADIOACTIVE SEED AND SENTINEL LYMPH NODE BIOPSY;  Surgeon: Oza Blumenthal, MD;  Location: [facility] OR;  Service: General;  Laterality: Right;   PORT-A-CATH REMOVAL Left 09/13/2021   Procedure: REMOVAL PORT-A-CATH;  Surgeon: Oza Blumenthal, MD;  Location: [facility];  Service: General;  Laterality: Left;   PORTACATH PLACEMENT N/A 06/09/2021   Procedure: INSERTION PORT-A-CATH;  Surgeon: Oza Blumenthal, MD;  Location: [facility];  Service: General;  Laterality: N/A;    SOCIAL HISTORY: Social History   Socioeconomic History   Marital status: Married    Spouse name: Not on file   Number of children: Not on file   Years of education: Not on file   Highest education level: Not on file  Occupational History   Not on file  Tobacco Use   Smoking status: Never   Smokeless tobacco: Never  Vaping Use   Vaping status: Never Used  Substance and Sexual Activity   Alcohol use: Yes    Comment: rarely   Drug use: Never   Sexual activity: Yes    Partners: Male    Birth control/protection: Post-menopausal, None    Comment: due to chemo  Other Topics Concern   Not on file  Social History Narrative   Not on file   Social Drivers of Health   Financial Resource Strain: Not on file  Food Insecurity: Not on file  Transportation Needs: Not on file  Physical Activity: Not on file  Stress: Not on file  Social Connections: Unknown (07/05/2021)   Received from [facility]   Social Network    Social Network: Not  on file  Intimate Partner Violence: Unknown (05/27/2021)   Received from [employer], [facility]   HITS    Physically Hurt: Not on file    Insult or Talk Down To: Not on file    Threaten Physical Harm: Not on file    Scream or Curse: Not on file    FAMILY HISTORY: Family History  Problem Relation Age of Onset   Breast cancer Maternal Grandmother 57       dx. again at 31, unsure if recurrence or second primary   Prostate cancer Paternal Grandfather    Breast cancer Other    Brain cancer Other     Review of Systems  Constitutional:  Negative for appetite change, chills, fatigue, fever and unexpected weight change.  HENT:   Negative for hearing loss, lump/mass and trouble swallowing.   Eyes:  Negative for eye problems and icterus.  Respiratory:  Negative for chest tightness, cough and shortness of breath.   Cardiovascular:  Negative for chest pain, leg swelling and palpitations.  Gastrointestinal:  Negative for abdominal distention, abdominal pain, constipation, diarrhea, nausea and vomiting.  Endocrine: Negative for hot flashes.  Genitourinary:  Negative  for difficulty urinating.   Musculoskeletal:  Negative for arthralgias.  Skin:  Negative for itching and rash.  Neurological:  Negative for dizziness, extremity weakness, headaches and numbness.  Hematological:  Negative for adenopathy. Does not bruise/bleed easily.  Psychiatric/Behavioral:  Negative for depression. The patient is not nervous/anxious.       PHYSICAL EXAMINATION    Vitals:   07/20/23 1047  BP: 121/88  Pulse: (!) 59  Resp: 18  Temp: 98.2 F (36.8 C)  SpO2: 98%    Physical Exam Constitutional:      General: She is not in acute distress.    Appearance: Normal appearance. She is not toxic-appearing.  HENT:     Head: Normocephalic and atraumatic.     Mouth/Throat:     Mouth: Mucous membranes are moist.     Pharynx: Oropharynx is clear. No oropharyngeal exudate or posterior oropharyngeal erythema.   Eyes:     General: No scleral icterus. Cardiovascular:     Rate and Rhythm: Normal rate and regular rhythm.     Pulses: Normal pulses.     Heart sounds: Normal heart sounds.  Pulmonary:     Effort: Pulmonary effort is normal.     Breath sounds: Normal breath sounds.  Chest:     Comments: Right breast status postlumpectomy and radiation no sign of local recurrence left breast is benign. Abdominal:     General: Abdomen is flat. Bowel sounds are normal. There is no distension.     Palpations: Abdomen is soft.     Tenderness: There is no abdominal tenderness.  Musculoskeletal:        General: No swelling.     Cervical back: Neck supple.  Lymphadenopathy:     Cervical: No cervical adenopathy.     Upper Body:     Right upper body: No supraclavicular or axillary adenopathy.     Left upper body: No supraclavicular or axillary adenopathy.  Skin:    General: Skin is warm and dry.     Findings: No rash.  Neurological:     General: No focal deficit present.     Mental Status: She is alert.  Psychiatric:        Mood and Affect: Mood normal.        Behavior: Behavior normal.     LABORATORY DATA:  CBC    Component Value Date/Time   WBC 11.6 (H) 08/24/2021 1034   WBC 5.8 05/11/2021 0850   RBC 3.78 (L) 08/24/2021 1034   HGB 11.7 (L) 08/24/2021 1034   HCT 34.9 (L) 08/24/2021 1034   PLT 329 08/24/2021 1034   MCV 92.3 08/24/2021 1034   MCH 31.0 08/24/2021 1034   MCHC 33.5 08/24/2021 1034   RDW 16.1 (H) 08/24/2021 1034   LYMPHSABS 2.1 08/24/2021 1034   MONOABS 1.2 (H) 08/24/2021 1034   EOSABS 0.0 08/24/2021 1034   BASOSABS 0.0 08/24/2021 1034    CMP     Component Value Date/Time   NA 137 08/24/2021 1034   K 3.9 08/24/2021 1034   CL 106 08/24/2021 1034   CO2 23 08/24/2021 1034   GLUCOSE 120 (H) 08/24/2021 1034   BUN 13 08/24/2021 1034   CREATININE 0.88 08/24/2021 1034   CALCIUM 9.0 08/24/2021 1034   PROT 6.5 08/24/2021 1034   ALBUMIN 3.9 08/24/2021 1034   AST 16  08/24/2021 1034   ALT 28 08/24/2021 1034   ALKPHOS 47 08/24/2021 1034   BILITOT 0.3 08/24/2021 1034   GFRNONAA >60 08/24/2021 1034  ASSESSMENT and THERAPY PLAN:   Malignant neoplasm of upper-outer quadrant of right breast in female, estrogen receptor negative (HCC) 04/21/2021:Screening mammogram detected right breast mass 6 mm by ultrasound, axilla negative, biopsy: Grade 2 IDC ER 0%, PR 0%, HER2 2+ by IHC 05/11/2021:Right lumpectomy: Grade 2 IDC 0.9 cm, margins negative, angiolymphatic invasion present, 1 lymph node with micrometastases, 0/2 other lymph nodes negative, ER 0%, PR 0%, HER2 2+ by IHC and FISH negative with a ratio 1.33 Ki-67 25%   Treatment plan: 1.  Adjuvant chemotherapy with Taxotere  Cytoxan  every 3 weeks x4 completed 08/24/2021 2.  Adjuvant radiation to be completed 11/25/2021 -------------------------------------------------------------------------------------------------------------------   Assessment and Plan Assessment & Plan Stage 1B triple-negative breast cancer Stage 1B triple-negative breast cancer, post-adjuvant chemotherapy and radiation, with no mammographic evidence of malignancy as of February 2025. She prefers close monitoring and is interested in research on copper levels, zinc supplements, and a potential vaccine. - Continue quarterly Signatera testing. - Order diagnostic mammograms as requested. - Investigate copper levels in triple-negative breast cancer; test if indicated. - Consider vitamin D and B12 testing for brain fog. - Update on triple-negative breast cancer vaccine study. - Reinforced healthy diet and exercise  RTC in 1 year for continued surveillance    All questions were answered. The patient knows to call the clinic with any problems, questions or concerns. We can certainly see the patient much sooner if necessary.  Total encounter time:30 minutes*in face-to-face visit time, chart review, lab review, care coordination, order entry, and  documentation of the encounter time.    Alwin Baars, NP 07/20/23 3:27 PM Medical Oncology and Hematology [facility] [address] [place], [place] [address] Tel. [phone]    Fax. [phone]  *Total Encounter Time as defined by the Centers for Medicare and Medicaid Services includes, in addition to the face-to-face time of a patient visit (documented in the note above) non-face-to-face time: obtaining and reviewing outside history, ordering and reviewing medications, tests or procedures, care coordination (communications with other health care professionals or caregivers) and documentation in the medical record.

## 2023-07-20 NOTE — Assessment & Plan Note (Signed)
 04/21/2021:Screening mammogram detected right breast mass 6 mm by ultrasound, axilla negative, biopsy: Grade 2 IDC ER 0%, PR 0%, HER2 2+ by IHC 05/11/2021:Right lumpectomy: Grade 2 IDC 0.9 cm, margins negative, angiolymphatic invasion present, 1 lymph node with micrometastases, 0/2 other lymph nodes negative, ER 0%, PR 0%, HER2 2+ by IHC and FISH negative with a ratio 1.33 Ki-67 25%   Treatment plan: 1.  Adjuvant chemotherapy with Taxotere  Cytoxan  every 3 weeks x4 completed 08/24/2021 2.  Adjuvant radiation to be completed 11/25/2021 -------------------------------------------------------------------------------------------------------------------   Assessment and Plan Assessment & Plan Stage 1B triple-negative breast cancer Stage 1B triple-negative breast cancer, post-adjuvant chemotherapy and radiation, with no mammographic evidence of malignancy as of February 2025. She prefers close monitoring and is interested in research on copper levels, zinc supplements, and a potential vaccine. - Continue quarterly Signatera testing. - Order diagnostic mammograms as requested. - Investigate copper levels in triple-negative breast cancer; test if indicated. - Consider vitamin D and B12 testing for brain fog. - Update on triple-negative breast cancer vaccine study. - Reinforced healthy diet and exercise  RTC in 1 year for continued surveillance

## 2023-07-25 ENCOUNTER — Telehealth: Payer: Self-pay

## 2023-07-25 NOTE — Telephone Encounter (Signed)
-----   Message from Nurse Rice Chamorro B sent at 07/25/2023  3:57 PM EDT ----- Attempted to call pt per NP. LVM for call back. ----- Message ----- From: Percival Brace, NP Sent: 07/25/2023  11:08 AM EDT To: [facility] 4  Please call and check on patient's energy level.  Dr. Lee Public is unaware of any copper data or results from vaccine trials at this point.    If she wants to come in for labs, I am happy to put in orders if she is still feeling poorly.    Thanks. ----- Message ----- From: Cameron Cea, MD Sent: 07/23/2023   8:02 AM EDT To: Percival Brace, NP  No idea on copper Vaccine trials on going. No data that I know of yet V ----- Message ----- From: Percival Brace, NP Sent: 07/20/2023   3:24 PM EDT To: Cameron Cea, MD  Have you heard anything about copper buildup and triple negative breast cancer and how it can increase the risk of breast cancer recurrence?  If so do you feel like copper testing is necessary at this time?  Have you heard any results about vaccines for triple negative breast cancer and when those results are anticipated?  Thanks, LC

## 2023-07-27 ENCOUNTER — Telehealth: Payer: Self-pay | Admitting: *Deleted

## 2023-07-27 NOTE — Telephone Encounter (Signed)
 RN placed call to pt regarding NP recommendations stating Dr. Lee Public is unaware of any copper data or results from vaccine trials at this point. No answer, LVM for pt.

## 2023-08-25 LAB — SIGNATERA
SIGNATERA MTM READOUT: 0 MTM/ml
SIGNATERA TEST RESULT: NEGATIVE

## 2023-09-21 IMAGING — MG MM PLC BREAST LOC DEV 1ST LESION INC*R*
8 series · 8 of 8 positions shown · non-contrast
Comparison: Previous exam(s).

CLINICAL DATA: 50-year-old female for radioactive seed localization
of RIGHT breast cancer prior to lumpectomy.

EXAM:
MAMMOGRAPHIC GUIDED RADIOACTIVE SEED LOCALIZATION OF THE RIGHT
BREAST

[R CC (1 of 4)]
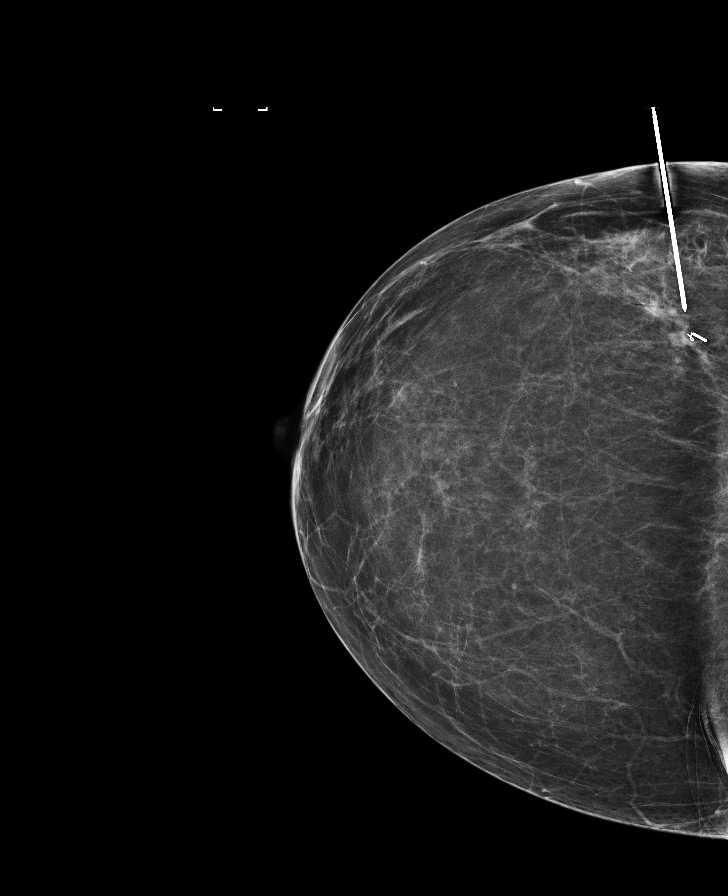

[R LM (1 of 4)]
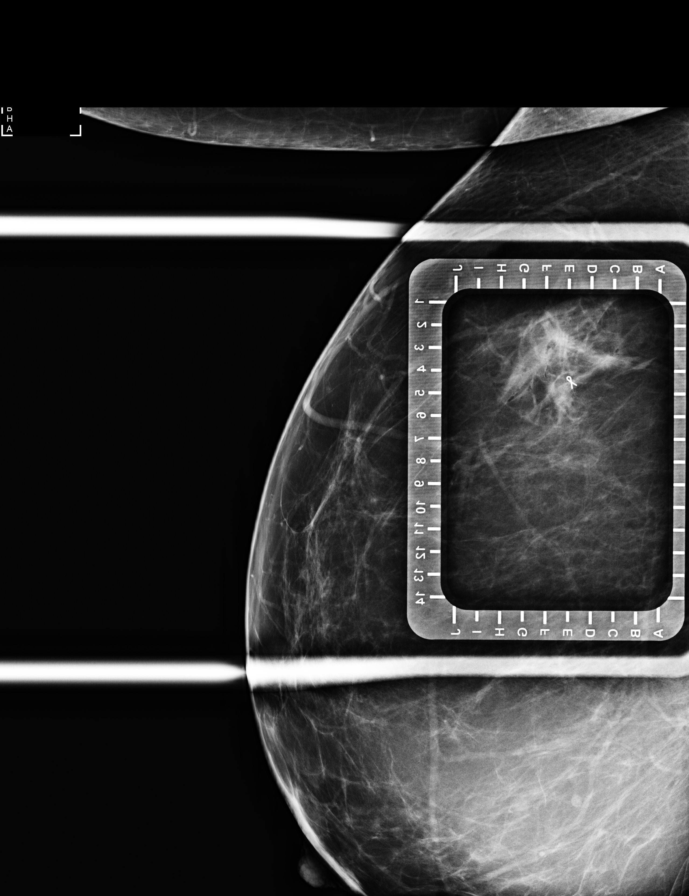

[R LM (2 of 4)]
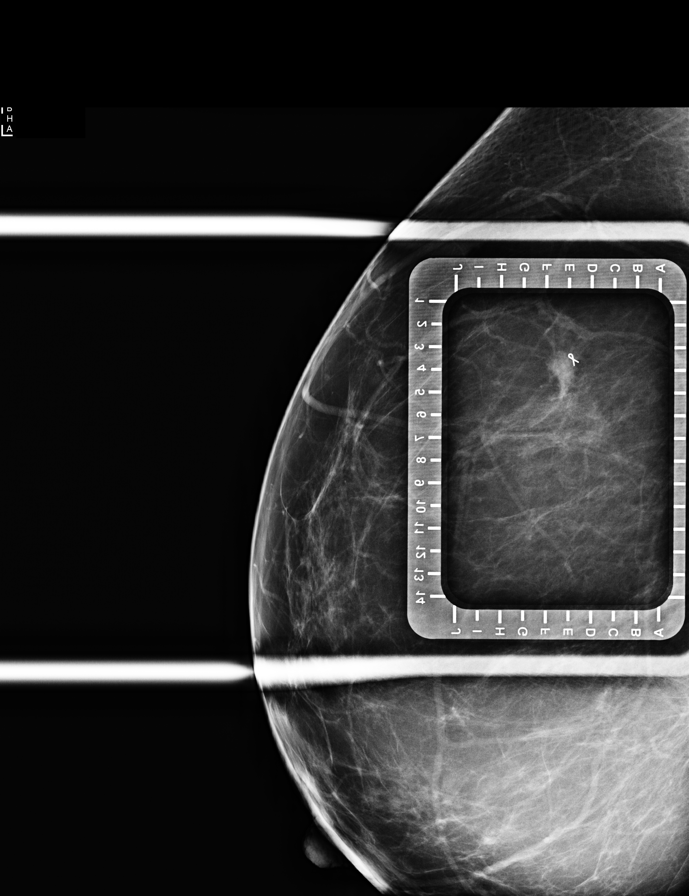

[R CC (2 of 4)]
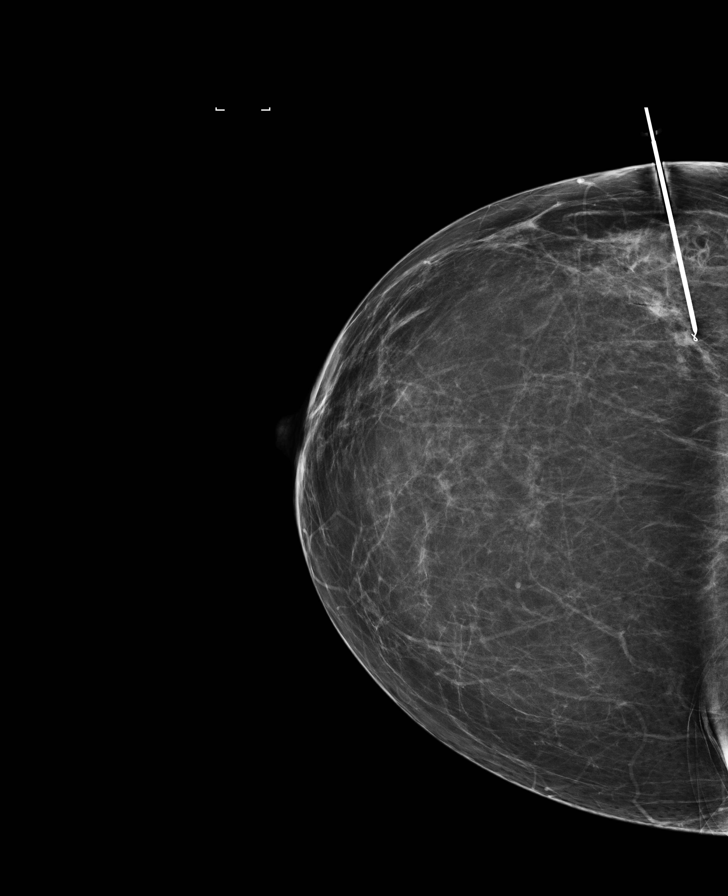

[R LM (3 of 4)]
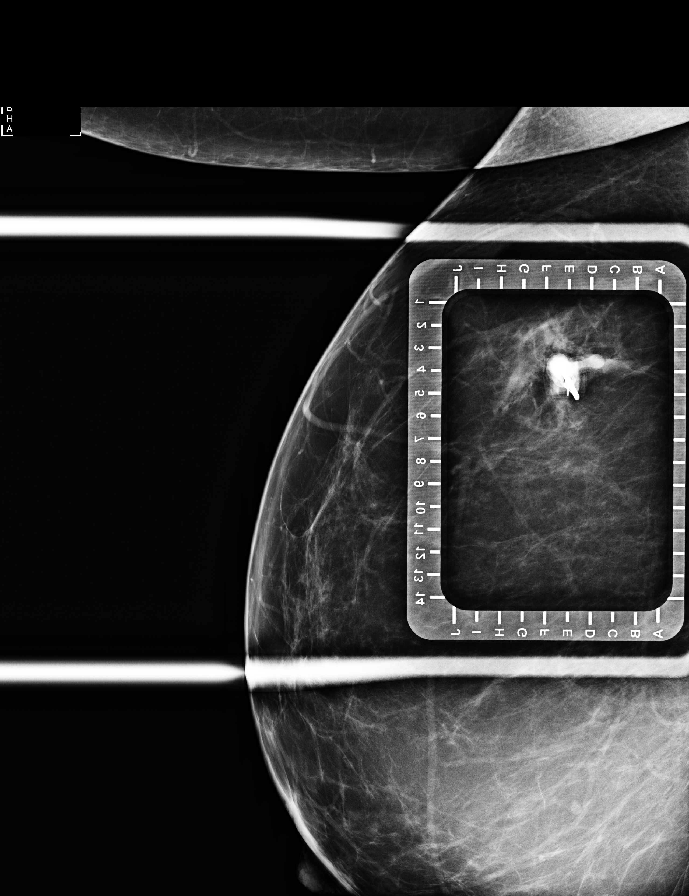

[R CC (3 of 4)]
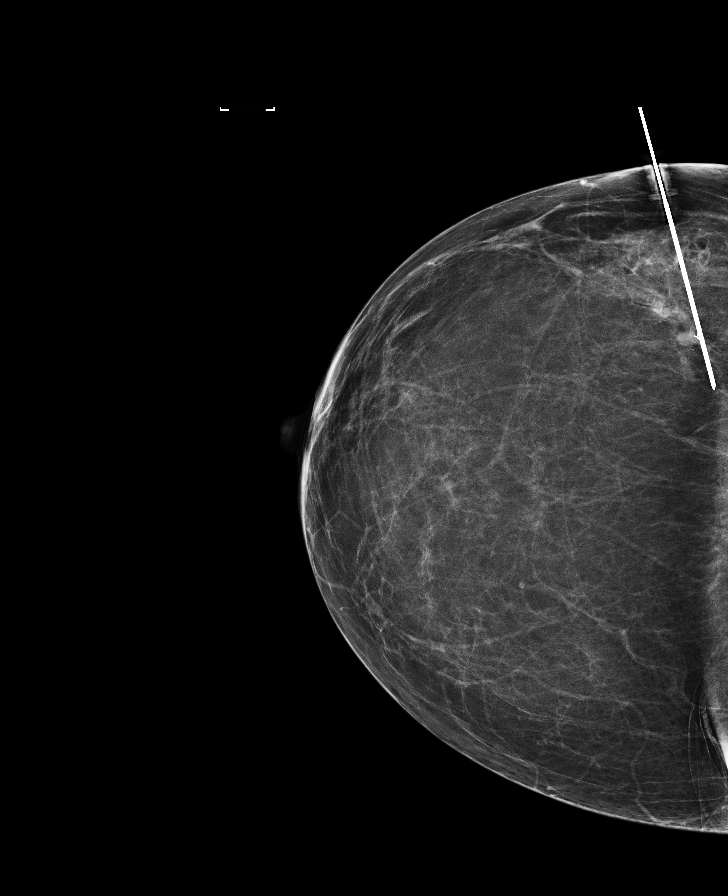

[R LM (4 of 4)]
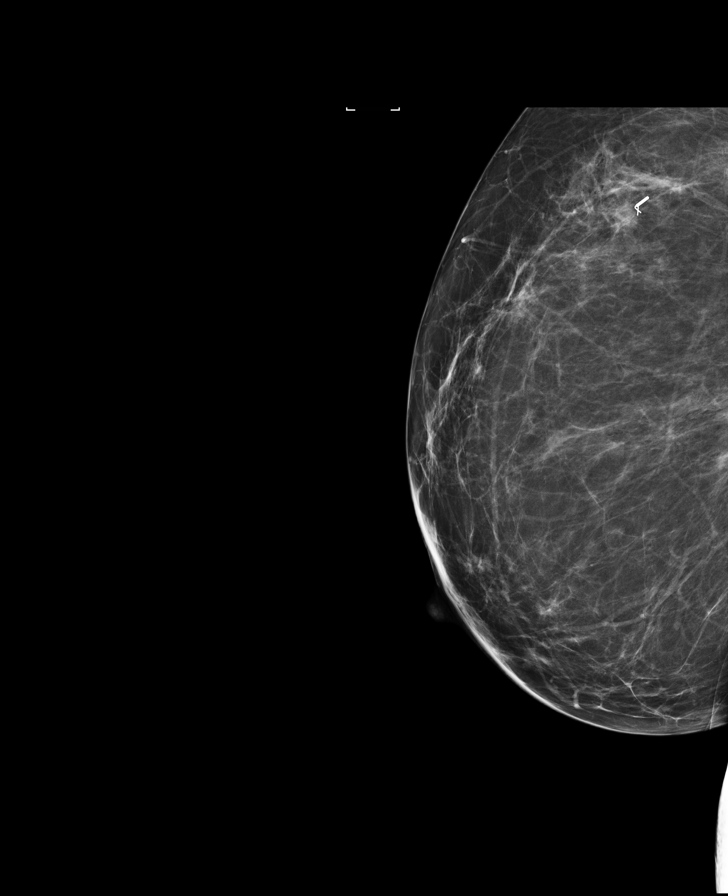

[R CC (4 of 4)]
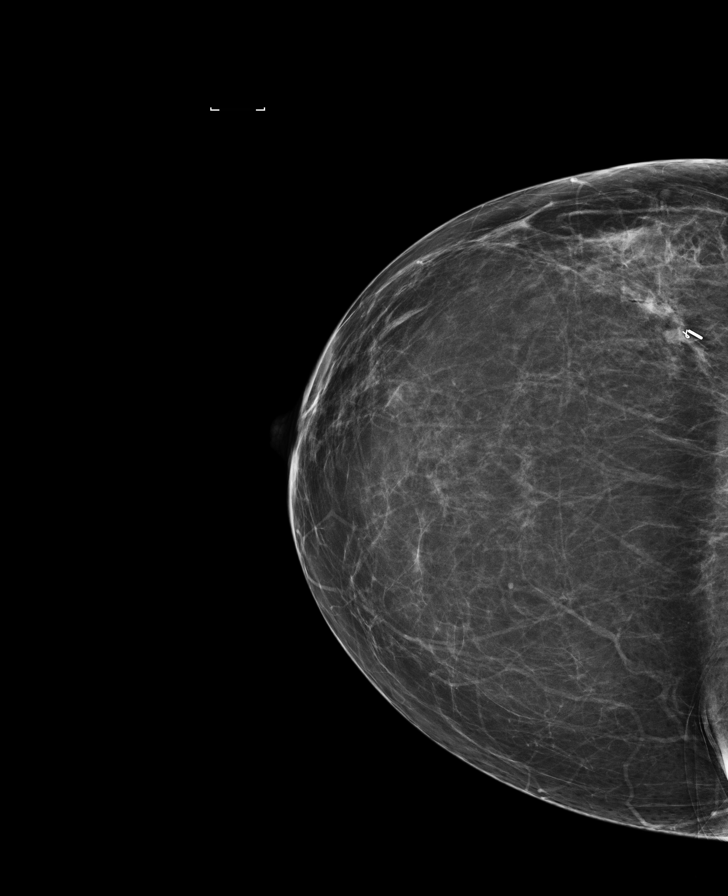

[8 of 8 positions shown; findings below may reference images not displayed]

FINDINGS: Patient presents for radioactive seed localization prior to RIGHT
lumpectomy. I met with the patient and we discussed the procedure of
seed localization including benefits and alternatives. We discussed
the high likelihood of a successful procedure. We discussed the
risks of the procedure including infection, bleeding, tissue injury
and further surgery. We discussed the low dose of radioactivity
involved in the procedure. Informed, written consent was given.

The usual time-out protocol was performed immediately prior to the
procedure.

Using mammographic guidance, sterile technique, 1% lidocaine and an
H-JFO radioactive seed, the RIBBON clip was localized using a
LATERAL approach. The follow-up mammogram images confirm the seed in
the expected location and were marked for Dr. S M Tazul.

Follow-up survey of the patient confirms presence of the radioactive
seed.

Order number of H-JFO seed:  [id number].

Total activity:  0.239 millicuries.  Reference Date: 04/18/2021.

The patient tolerated the procedure well with only a transient vagal
reaction, and was released from the [REDACTED]. She was given
instructions regarding seed removal.
IMPRESSION: Radioactive seed localization RIGHT breast. No apparent
complications.

## 2023-09-23 IMAGING — MG MM BREAST SURGICAL SPECIMEN
1 series · 1 of 1 positions shown · non-contrast
Comparison: Previous exam(s).

CLINICAL DATA: Post right breast lumpectomy.

EXAM:
SPECIMEN RADIOGRAPH OF THE RIGHT BREAST

[R]
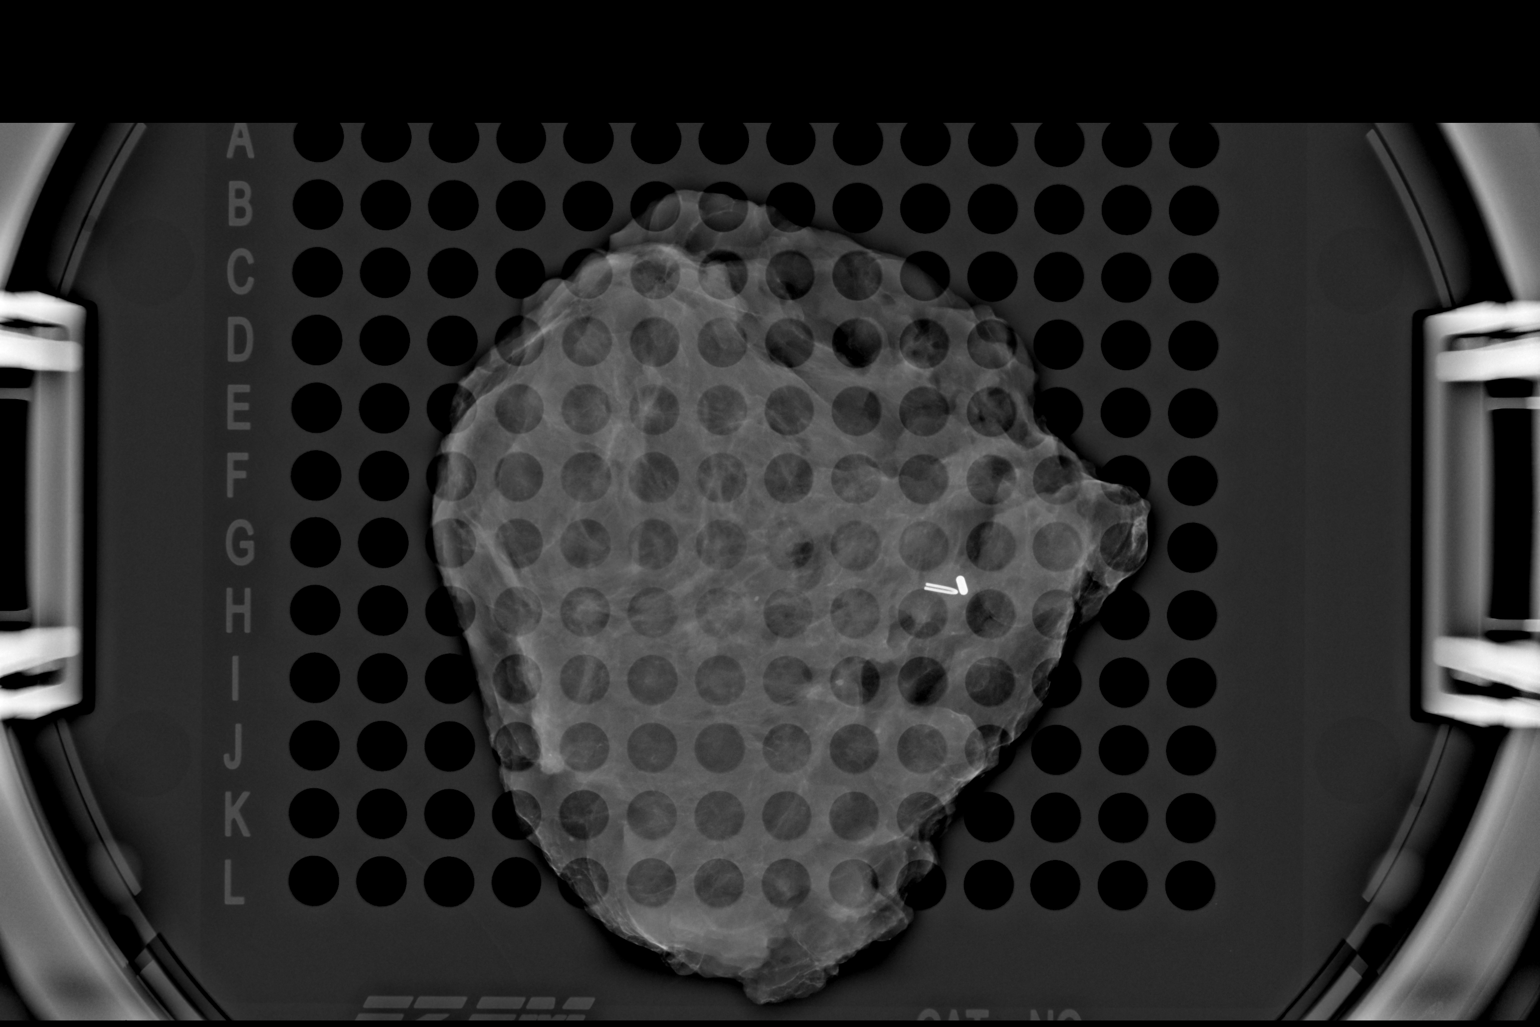

[1 of 1 positions shown; findings below may reference images not displayed]

FINDINGS: Status post excision of the right breast. The radioactive seed and
ribbon shaped biopsy marker clip are present, completely intact, and
were marked for pathology.
IMPRESSION: Specimen radiograph of the right breast.

## 2023-11-27 LAB — SIGNATERA
SIGNATERA MTM READOUT: 0 MTM/ml
SIGNATERA TEST RESULT: NEGATIVE

## 2023-12-03 ENCOUNTER — Ambulatory Visit

## 2024-01-14 ENCOUNTER — Ambulatory Visit: Attending: Adult Health

## 2024-01-14 ENCOUNTER — Ambulatory Visit

## 2024-01-14 VITALS — Wt 195.1 lb

## 2024-01-14 DIAGNOSIS — Z483 Aftercare following surgery for neoplasm: Secondary | ICD-10-CM | POA: Insufficient documentation

## 2024-01-14 NOTE — Therapy (Signed)
  OUTPATIENT PHYSICAL THERAPY SOZO SCREENING NOTE   Patient Name: Natasha Ortega MRN: [id number] DOB:Apr 09, 1970, 53 y.o., female Today's Date: 01/14/2024  PCP: Marget Lenis, MD REFERRING PROVIDER: Crawford Jacobsen Cornett*   PT End of Session - 01/14/24 1518     Visit Number 7   # unchanged due to screen only   PT Start Time 1515    PT Stop Time 1519    PT Time Calculation (min) 4 min    Activity Tolerance Patient tolerated treatment well    Behavior During Therapy [facility] for tasks assessed/performed          Past Medical History:  Diagnosis Date   Cancer (HCC) 04/21/2021   right breast IDC   Headache    migraines   Personal history of chemotherapy    Personal history of radiation therapy    Port-A-Cath in place 06/21/2021   Past Surgical History:  Procedure Laterality Date   BREAST LUMPECTOMY Right 05/11/2021   BREAST LUMPECTOMY WITH RADIOACTIVE SEED AND SENTINEL LYMPH NODE BIOPSY Right 05/11/2021   Procedure: RIGHT BREAST LUMPECTOMY WITH RADIOACTIVE SEED AND SENTINEL LYMPH NODE BIOPSY;  Surgeon: Vernetta Berg, MD;  Location: [facility] OR;  Service: General;  Laterality: Right;   PORT-A-CATH REMOVAL Left 09/13/2021   Procedure: REMOVAL PORT-A-CATH;  Surgeon: Vernetta Berg, MD;  Location: [facility];  Service: General;  Laterality: Left;   PORTACATH PLACEMENT N/A 06/09/2021   Procedure: INSERTION PORT-A-CATH;  Surgeon: Vernetta Berg, MD;  Location: [facility];  Service: General;  Laterality: N/A;   Patient Active Problem List   Diagnosis Date Noted   Genetic testing 05/09/2021   Family history of breast cancer 05/05/2021   Malignant neoplasm of upper-outer quadrant of right breast in female, estrogen receptor negative (HCC) 04/28/2021    REFERRING DIAG: right breast cancer at risk for lymphedema  THERAPY DIAG:  Aftercare following surgery for neoplasm  PERTINENT HISTORY:   Patient was diagnosed on 04/28/2021 with right grade 2  invasive ductal carcinoma breast cancer. She underwent a right lumpectomy and sentinel node biopsy on 05/11/2021. She had 1 node with micromets and 2 negative nodes (3 total) removed. It is ER/PR negative and HER2 negative with a Ki67 of 40%   PRECAUTIONS: right UE Lymphedema risk,   SUBJECTIVE: Pt returns for her last 3 month L-Dex screen.   PAIN:  Are you having pain? No  SOZO SCREENING: Patient was assessed today using the SOZO machine to determine the lymphedema index score. This was compared to her baseline score. It was determined that she is within the recommended range when compared to her baseline and no further action is needed at this time. She will continue SOZO screenings. These are done every 3 months for 2 years post operatively followed by every 6 months for 2 years, and then annually.   L-DEX FLOWSHEETS - 01/14/24 1500       L-DEX LYMPHEDEMA SCREENING   Measurement Type Unilateral    L-DEX MEASUREMENT EXTREMITY Upper Extremity    POSITION  Standing    DOMINANT SIDE Right    At Risk Side Right    BASELINE SCORE (UNILATERAL) -5.6    L-DEX SCORE (UNILATERAL) -3.4    VALUE CHANGE (UNILAT) 2.2        . P: Cont 6 month L-Dex screens until 04/2025.   Aden Berwyn Caldron, PTA 01/14/2024, 3:20 PM

## 2024-02-12 LAB — SIGNATERA
SIGNATERA MTM READOUT: 0 MTM/ml
SIGNATERA TEST RESULT: NEGATIVE

## 2024-04-10 ENCOUNTER — Encounter

## 2024-06-30 ENCOUNTER — Ambulatory Visit
